# Patient Record
Sex: Female | Born: 1985 | Race: White | Hispanic: No | Smoking: Former smoker
Health system: Southern US, Community
[De-identification: ages and names within clinical notes are randomized; demographics above are authoritative.]

## PROBLEM LIST (undated history)

## (undated) DIAGNOSIS — T4145XA Adverse effect of unspecified anesthetic, initial encounter: Secondary | ICD-10-CM

## (undated) DIAGNOSIS — Z8742 Personal history of other diseases of the female genital tract: Secondary | ICD-10-CM

## (undated) DIAGNOSIS — J45909 Unspecified asthma, uncomplicated: Secondary | ICD-10-CM

## (undated) DIAGNOSIS — Z87442 Personal history of urinary calculi: Secondary | ICD-10-CM

## (undated) DIAGNOSIS — F32A Depression, unspecified: Secondary | ICD-10-CM

## (undated) DIAGNOSIS — F39 Unspecified mood [affective] disorder: Secondary | ICD-10-CM

## (undated) DIAGNOSIS — N809 Endometriosis, unspecified: Secondary | ICD-10-CM

## (undated) DIAGNOSIS — Z8709 Personal history of other diseases of the respiratory system: Secondary | ICD-10-CM

## (undated) DIAGNOSIS — R102 Pelvic and perineal pain: Secondary | ICD-10-CM

## (undated) DIAGNOSIS — F419 Anxiety disorder, unspecified: Secondary | ICD-10-CM

## (undated) DIAGNOSIS — Z8619 Personal history of other infectious and parasitic diseases: Secondary | ICD-10-CM

## (undated) DIAGNOSIS — K5792 Diverticulitis of intestine, part unspecified, without perforation or abscess without bleeding: Secondary | ICD-10-CM

## (undated) DIAGNOSIS — T8859XA Other complications of anesthesia, initial encounter: Secondary | ICD-10-CM

## (undated) DIAGNOSIS — F329 Major depressive disorder, single episode, unspecified: Secondary | ICD-10-CM

## (undated) HISTORY — PX: ABDOMINAL HYSTERECTOMY: SHX81

## (undated) HISTORY — PX: ABLATION COLPOCLESIS: SHX1118

## (undated) HISTORY — DX: Diverticulitis of intestine, part unspecified, without perforation or abscess without bleeding: K57.92

## (undated) HISTORY — PX: WISDOM TOOTH EXTRACTION: SHX21

---
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
## 2007-02-13 ENCOUNTER — Emergency Department (HOSPITAL_COMMUNITY): Admission: EM | Admit: 2007-02-13 | Discharge: 2007-02-14 | Payer: Self-pay | Admitting: Emergency Medicine

## 2007-03-31 ENCOUNTER — Emergency Department (HOSPITAL_COMMUNITY): Admission: EM | Admit: 2007-03-31 | Discharge: 2007-03-31 | Payer: Self-pay | Admitting: Emergency Medicine

## 2007-06-13 ENCOUNTER — Emergency Department (HOSPITAL_COMMUNITY): Admission: EM | Admit: 2007-06-13 | Discharge: 2007-06-13 | Payer: Self-pay | Admitting: Emergency Medicine

## 2007-06-20 ENCOUNTER — Emergency Department (HOSPITAL_COMMUNITY): Admission: EM | Admit: 2007-06-20 | Discharge: 2007-06-21 | Payer: Self-pay | Admitting: Emergency Medicine

## 2007-09-24 ENCOUNTER — Emergency Department (HOSPITAL_COMMUNITY): Admission: EM | Admit: 2007-09-24 | Discharge: 2007-09-24 | Payer: Self-pay | Admitting: Emergency Medicine

## 2007-10-11 ENCOUNTER — Emergency Department (HOSPITAL_COMMUNITY): Admission: EM | Admit: 2007-10-11 | Discharge: 2007-10-12 | Payer: Self-pay | Admitting: Emergency Medicine

## 2007-10-27 ENCOUNTER — Ambulatory Visit (HOSPITAL_COMMUNITY): Admission: RE | Admit: 2007-10-27 | Discharge: 2007-10-27 | Payer: Self-pay | Admitting: Obstetrics and Gynecology

## 2007-11-11 ENCOUNTER — Emergency Department (HOSPITAL_COMMUNITY): Admission: EM | Admit: 2007-11-11 | Discharge: 2007-11-11 | Payer: Self-pay | Admitting: Emergency Medicine

## 2007-12-24 ENCOUNTER — Inpatient Hospital Stay (HOSPITAL_COMMUNITY): Admission: EM | Admit: 2007-12-24 | Discharge: 2007-12-27 | Payer: Self-pay | Admitting: *Deleted

## 2007-12-24 ENCOUNTER — Ambulatory Visit: Payer: Self-pay | Admitting: *Deleted

## 2008-02-12 ENCOUNTER — Emergency Department (HOSPITAL_COMMUNITY): Admission: EM | Admit: 2008-02-12 | Discharge: 2008-02-12 | Payer: Self-pay | Admitting: Emergency Medicine

## 2009-02-12 ENCOUNTER — Emergency Department (HOSPITAL_COMMUNITY): Admission: EM | Admit: 2009-02-12 | Discharge: 2009-02-12 | Payer: Self-pay | Admitting: Family Medicine

## 2009-03-11 ENCOUNTER — Inpatient Hospital Stay (HOSPITAL_COMMUNITY): Admission: AD | Admit: 2009-03-11 | Discharge: 2009-03-11 | Payer: Self-pay | Admitting: Obstetrics and Gynecology

## 2009-03-17 ENCOUNTER — Emergency Department (HOSPITAL_COMMUNITY): Admission: EM | Admit: 2009-03-17 | Discharge: 2009-03-17 | Payer: Self-pay | Admitting: Emergency Medicine

## 2009-03-29 ENCOUNTER — Emergency Department (HOSPITAL_COMMUNITY): Admission: EM | Admit: 2009-03-29 | Discharge: 2009-03-29 | Payer: Self-pay | Admitting: Emergency Medicine

## 2009-04-13 HISTORY — PX: ESSURE TUBAL LIGATION: SUR464

## 2009-08-26 ENCOUNTER — Inpatient Hospital Stay (HOSPITAL_COMMUNITY): Admission: AD | Admit: 2009-08-26 | Discharge: 2009-08-26 | Payer: Self-pay | Admitting: Obstetrics and Gynecology

## 2009-11-08 ENCOUNTER — Inpatient Hospital Stay (HOSPITAL_COMMUNITY): Admission: RE | Admit: 2009-11-08 | Discharge: 2009-11-10 | Payer: Self-pay | Admitting: Obstetrics and Gynecology

## 2009-11-12 ENCOUNTER — Ambulatory Visit (HOSPITAL_COMMUNITY): Admission: RE | Admit: 2009-11-12 | Discharge: 2009-11-12 | Payer: Self-pay | Admitting: Anesthesiology

## 2010-04-30 ENCOUNTER — Ambulatory Visit (HOSPITAL_COMMUNITY): Admission: RE | Admit: 2010-04-30 | Payer: Self-pay | Source: Home / Self Care | Admitting: Obstetrics and Gynecology

## 2010-05-14 ENCOUNTER — Encounter: Payer: Self-pay | Admitting: Obstetrics and Gynecology

## 2010-06-28 LAB — CBC
HCT: 28.9 % — ABNORMAL LOW (ref 36.0–46.0)
HCT: 32.8 % — ABNORMAL LOW (ref 36.0–46.0)
Hemoglobin: 11 g/dL — ABNORMAL LOW (ref 12.0–15.0)
Hemoglobin: 9.5 g/dL — ABNORMAL LOW (ref 12.0–15.0)
MCH: 32 pg (ref 26.0–34.0)
MCH: 32.1 pg (ref 26.0–34.0)
MCHC: 33 g/dL (ref 30.0–36.0)
MCHC: 33.7 g/dL (ref 30.0–36.0)
MCV: 95.2 fL (ref 78.0–100.0)
MCV: 97.4 fL (ref 78.0–100.0)
Platelets: 239 10*3/uL (ref 150–400)
Platelets: 268 10*3/uL (ref 150–400)
RBC: 2.97 MIL/uL — ABNORMAL LOW (ref 3.87–5.11)
RBC: 3.44 MIL/uL — ABNORMAL LOW (ref 3.87–5.11)
RDW: 16 % — ABNORMAL HIGH (ref 11.5–15.5)
RDW: 16 % — ABNORMAL HIGH (ref 11.5–15.5)
WBC: 16.7 10*3/uL — ABNORMAL HIGH (ref 4.0–10.5)
WBC: 8.5 10*3/uL (ref 4.0–10.5)

## 2010-06-28 LAB — MRSA PCR SCREENING: MRSA by PCR: NEGATIVE

## 2010-06-28 LAB — RPR: RPR Ser Ql: NONREACTIVE

## 2010-06-30 LAB — URINALYSIS, ROUTINE W REFLEX MICROSCOPIC
Bilirubin Urine: NEGATIVE
Glucose, UA: NEGATIVE mg/dL
Hgb urine dipstick: NEGATIVE
Ketones, ur: NEGATIVE mg/dL
Nitrite: NEGATIVE
Protein, ur: NEGATIVE mg/dL
Specific Gravity, Urine: 1.02 (ref 1.005–1.030)
Urobilinogen, UA: 0.2 mg/dL (ref 0.0–1.0)
pH: 7.5 (ref 5.0–8.0)

## 2010-06-30 LAB — URINE MICROSCOPIC-ADD ON

## 2010-06-30 LAB — FETAL FIBRONECTIN: Fetal Fibronectin: NEGATIVE

## 2010-07-16 LAB — URINALYSIS, ROUTINE W REFLEX MICROSCOPIC
Bilirubin Urine: NEGATIVE
Glucose, UA: NEGATIVE mg/dL
Hgb urine dipstick: NEGATIVE
Ketones, ur: NEGATIVE mg/dL
Leukocytes, UA: NEGATIVE
Nitrite: POSITIVE — AB
Protein, ur: NEGATIVE mg/dL
Specific Gravity, Urine: 1.025 (ref 1.005–1.030)
Urobilinogen, UA: 0.2 mg/dL (ref 0.0–1.0)
pH: 6 (ref 5.0–8.0)

## 2010-07-16 LAB — GC/CHLAMYDIA PROBE AMP, GENITAL
Chlamydia, DNA Probe: NEGATIVE
GC Probe Amp, Genital: NEGATIVE

## 2010-07-16 LAB — WET PREP, GENITAL
Trich, Wet Prep: NONE SEEN
Yeast Wet Prep HPF POC: NONE SEEN

## 2010-07-16 LAB — URINE MICROSCOPIC-ADD ON

## 2010-07-16 LAB — POCT PREGNANCY, URINE
Preg Test, Ur: POSITIVE
Preg Test, Ur: NEGATIVE

## 2010-07-16 LAB — CBC
HCT: 42.7 % (ref 36.0–46.0)
Hemoglobin: 14.1 g/dL (ref 12.0–15.0)
MCHC: 33 g/dL (ref 30.0–36.0)
MCV: 99.6 fL (ref 78.0–100.0)
Platelets: 323 10*3/uL (ref 150–400)
RBC: 4.29 MIL/uL (ref 3.87–5.11)
RDW: 16.1 % — ABNORMAL HIGH (ref 11.5–15.5)
WBC: 14.6 10*3/uL — ABNORMAL HIGH (ref 4.0–10.5)

## 2010-07-16 LAB — HCG, QUANTITATIVE, PREGNANCY: hCG, Beta Chain, Quant, S: 15232 m[IU]/mL — ABNORMAL HIGH (ref <5)

## 2010-07-16 LAB — ABO/RH: ABO/RH(D): A POS

## 2010-07-16 LAB — POCT RAPID STREP A (OFFICE): Streptococcus, Group A Screen (Direct): NEGATIVE
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
## 2010-08-26 NOTE — Discharge Summary (Signed)
NAMEPARRIE, RASCO               ACCOUNT NO.:  [id number]   MEDICAL RECORD NO.:  [id number]          PATIENT TYPE:  INP   LOCATION:  9121                          [facility]ILITY:  [facility]   PHYSICIAN:  Malachi Pro. Ambrose Mantle, M.D. DATE OF BIRTH:  [birth year]   DATE OF ADMISSION:  12/02/2006  DATE OF DISCHARGE:  12/04/2006                               DISCHARGE SUMMARY   ADDENDUM:   DISCHARGE MEDICATIONS:  1. Vicodin 5/500, #20 tablets, one every 4-6 hours as needed for pain      with one refill.  2. Ativan 1 mg, #38, one p.o. b.i.d. p.r.n. anxiety.      Malachi Pro. Ambrose Mantle, M.D.  Electronically Signed     TFH/MEDQ  D:  12/04/2006  T:  12/05/2006  Job:  [id number]
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
## 2010-08-26 NOTE — Op Note (Signed)
NAMESHERECE, GAMBRILL               ACCOUNT NO.:  [id number]   MEDICAL RECORD NO.:  [id number]          PATIENT TYPE:  AMB   LOCATION:  SDC                           [facility]ILITY:  [facility]   PHYSICIAN:  Zenaida Niece, M.D.DATE OF BIRTH:  1985-07-12   DATE OF PROCEDURE:  10/27/2007  DATE OF DISCHARGE:                               OPERATIVE REPORT   PREOPERATIVE DIAGNOSIS:  Pelvic pain.   POSTOPERATIVE DIAGNOSIS:  Pelvic pain.   PROCEDURE:  Diagnostic laparoscopy.   SURGEON:  Zenaida Niece, MD.   ANESTHESIA:  General endotracheal tube.   FINDINGS:  She had a normal pelvis and normal abdomen.   SPECIMENS:  None.   ESTIMATED BLOOD LOSS:  Minimal.   COMPLICATIONS:  None.   PROCEDURE IN DETAIL:  The patient was taken to the operating room, and  placed in the dorsal supine position.  General anesthesia was induced.  Legs were placed in mobile stirrups, and left arm tucked to her side.  Abdomen was then prepped and draped in the usual sterile fashion,  bladder drained with a red Robinson catheter, and Hulka tenaculum  applied to the cervix for uterine manipulation.  Infraumbilical skin was  infiltrated with 0.75% Marcaine, and a 3/4-cm vertical incision was  made.   The Veress needle was inserted into the peritoneal cavity and placement  confirmed by the water drop test, and an opening pressure of 5 mmHg.  CO2 gas was insufflated to a pressure of 12 mmHg, and the Veress needle  was removed.  A 5-mm bladeless disposable trocar was then introduced  with direct visualization with the laparoscope.  A 5-mm port was then  also placed on the left side also under direct visualization.  The  pelvis and abdomen were well visualized.  Tubes, ovaries, and uterus  were all normal.  There was no evidence of endometriosis or adhesions.   The entire sigmoid colon appeared to be full of stool.  Appendix,  gallbladder, liver, and upper abdomen also appeared normal.  Again, no  source for her  pain was identified.  The 5-mm port on the left side was  removed with direct visualization.  All gas was allowed to deflate from  the abdomen, and the umbilical trocar was then removed.  Skin incisions were closed with interrupted subcuticular sutures of 4-0  Vicryl, followed by Dermabond.  The Hulka tenaculum was then removed.  The patient was awakened in the operating room.  She was taken to the  recovery room in stable condition after tolerating the procedure well.      Zenaida Niece, M.D.  Electronically Signed     TDM/MEDQ  D:  10/27/2007  T:  10/28/2007  Job:  [id number]

## 2010-08-26 NOTE — Discharge Summary (Signed)
NAMEELIZ, NIGG               ACCOUNT NO.:  [id number]   MEDICAL RECORD NO.:  [id number]          PATIENT TYPE:  INP   LOCATION:  9121                          [facility]ACILITY:  [facility]   PHYSICIAN:  Malachi Pro. Ambrose Mantle, M.D. DATE OF BIRTH:  01-03-1986   DATE OF ADMISSION:  12/02/2006  DATE OF DISCHARGE:  12/04/2006                               DISCHARGE SUMMARY   A 25 year old white female para 0-0-1-0, gravida 2, estimated  gestational age 66+ weeks by 7-week ultrasound with [facility] December 07, 2006,  presented for induction of labor because of a favorable cervix.  Her  blood group and type was A+ with a negative antibody, RPR was  nonreactive, rubella immune, hepatitis B surface antigen negative, HIV  negative, GC and chlamydia negative, one-hour Glucola 90, group B strep  negative.   The patient's prenatal course was complicated by back pain treated with  Darvocet, nausea and vomiting treated with Zofran, preterm contractions  treated with p.r.n. Procardia.  She had an abscess treated that was  MRSA.   OBSTETRICAL HISTORY:  She had an early abortion.   GYNECOLOGICAL HISTORY:  Abnormal Pap smear with normal colposcopy.   PAST MEDICAL HISTORY:  Asthma and anxiety.   SURGICAL HISTORY:  Ovarian cystectomy.   ALLERGIES:  BIAXIN, TORADOL and IBUPROFEN.   MEDICATIONS:  Darvocet p.r.n., Xanax p.r.n. and Ambien.   SOCIAL HISTORY:  History of marijuana use, quit tobacco with pregnancy.   PHYSICAL EXAMINATION:  VITAL SIGNS:  On admission she was afebrile,  vital signs were normal.  Fetal heart tones were reactive with  contractions every 3-5 minutes on Pitocin.  ABDOMEN:  Her abdomen was gravid, nontender.  Estimated fetal weight 7-  1/2 pounds.  PELVIC:  Cervix was 3-4 cm, 80% vertex at a -1. Dr. Jackelyn Knife performed  artificial rupture of membranes with clear fluid.  HEART:  Normal.  LUNGS:  Normal.   By 5:20 p.m. the patient was comfortable with her epidural.  Cervix was  9 cm.  Fetal  heart tones were reassuring with some mild variable  decelerations.  Contractions every 2-3 minutes.  The patient progressed  to complete dilatation, pushed well and delivered spontaneously a living  female infant 7 pounds 1 ounce with Apgars of 8 at 1 and 9 at 5 minutes.  Presentation was LOA.  Placenta was spontaneous and intact.  Cord blood  collection was done.  Second-degree laceration repaired with 3-0 Vicryl  with local block.  Several abrasions were hemostatic and not repaired.  Blood loss of less than 500 mL.   Postpartum, the patient did well and was discharged on the second  postpartum day.  A social work evaluation was done for history of  anxiety although the patient currently takes no medication to treat  symptoms.  Social work intervention was not necessary at this time  according to the Child psychotherapist.  On the second postpartum day, the  patient was afebrile.  Blood pressure normal.  She was ambulating well  without difficulty and she was ready for discharge.   LABORATORY DATA:  Initial hemoglobin of 11.9, hematocrit 34.2,  white  count 11,000, platelet count 256,000.  Follow-up hemoglobin 10.1, RPR  was nonreactive.   FINAL DIAGNOSES:  Intrauterine pregnancy 39+ weeks delivered low occiput  anterior.   OPERATION:  Spontaneous delivery vertex, repair of second-degree  laceration.   FINAL CONDITION:  Improved.   INSTRUCTIONS:  Include our regular discharge instruction booklet.  The  patient is advised to return to the office in 6 weeks for follow-up  examination.   DISCHARGE MEDICATIONS:  Vicodin 5/500 twenty tablets one every 4-6 hours  as needed for pain is given at discharge, one refill.      Malachi Pro. Ambrose Mantle, M.D.  Electronically Signed     TFH/MEDQ  D:  12/04/2006  T:  12/05/2006  Job:  [id number]

## 2010-08-29 NOTE — Op Note (Signed)
[name], [name]             ACCOUNT NO.:  [id number]   MEDICAL RECORD NO.:  [id number]          PATIENT TYPE:  AMB   LOCATION:  SDC                           [facility]ILITY:  [facility]   PHYSICIAN:  Naima A. Dillard, M.D. DATE OF BIRTH:  [birth year]   DATE OF PROCEDURE:  05/27/2005  DATE OF DISCHARGE:                                 OPERATIVE REPORT   PREOPERATIVE DIAGNOSIS:  Chronic pelvic pain.   POSTOPERATIVE DIAGNOSIS:  Chronic pelvic pain.   OPERATION/PROCEDURE:  Diagnostic laparoscopy with aspiration of right  ovarian simple cyst and biopsy of posterior cul-de-sac.   SURGEON:  Naima A. Normand Sloop, M.D.   ASSISTANTMarquis Lunch. Powell, P.A.-C.   ANESTHESIA:  General.   SPECIMENS:  Biopsy from posterior cul-de-sac.   ESTIMATED BLOOD LOSS:  Minimal.   URINARY OUTPUT:  125 mL.   COMPLICATIONS:  None.   CONDITION:  The patient went to recovery in stable condition.   DESCRIPTION OF PROCEDURE:  The patient was taken to the operating room where  she was given general anesthesia and placed in the dorsal lithotomy  position, prepped and draped in the normal sterile fashion.  A bivalve  speculum was placed into the vagina.  Anterior lip of the cervix grasped  with a single-tooth tenaculum and acorn manipulator was placed in the cervix  and attached to the tenaculum.   Attention was then turned to the abdomen where a 10 mm infraumbilical  incision was made after 0.25% Marcaine with epinephrine, 5 mL, was placed.  The incision was taken down to the fascia.  The fascia was incised and  extended bilaterally using Mayo scissors.  Peritoneum was identified and  tented up and entered.  A 0 Vicryl pursestring suture was placed around the  fascia.  The Hasson was placed into the abdomen cavity.  Intra-abdominal  placement was confirmed with the laparoscope after the abdomen was  insufflated with CO2 gas,  3 L.  The patient had normal-appearing anatomy,  normal appendix, normal liver,  normal-appearing gallbladder, normal-  appearing uterus.  Her right ovary was slightly enlarged.  To make sure  there was no dermoid cyst, I did aspirate it and it was just a functional  cyst with straw-clear fluid.  Right tube was normal.  Uterus was normal.  The anterior cul-de-sac was normal and in the posterior cul-de-sac, there  was a small red, kind of mass which was biopsied in total and removed.  Hemostasis was assured. The patient's left ovary and tube were normal.  There were some vessels seen in the posterior cul-de-sac that were slightly  engorged along her right ovary but no adhesions, no fibroids and no evidence  of endometriosis was seen.  A second trocar was placed 2 cm above the  symphysis pubis and a 5 mm trocar with direct visualization of the  laparoscope in order to manipulate the uterus and ovaries.  The 5 mm trocar  was then removed under direct visualization.  There was some bleeding right  along the peritoneum and fat where the trocar was placed.  This was made  hemostatic with a  Kleppinger.  All instruments were removed from the  abdomen. The subumbilical incision was closed with an 0 Vicryl.  All skin  incisions were closed with 3-0 Monocryl.  Sponge, lap and needle counts were  correct x2 were correct x2.  The patient went to the recovery room in stable  condition.      Naima A. Normand Sloop, M.D.  Electronically Signed     NAD/MEDQ  D:  05/27/2005  T:  05/27/2005  Job:  [id number]

## 2010-08-29 NOTE — Discharge Summary (Signed)
NAMEBOWEN, GOYAL NO.:  [id number]   MEDICAL RECORD NO.:  [id number]          PATIENT TYPE:  OBV   LOCATION:  6119                         FACILITY:  [facility]   PHYSICIAN:  Levander Campion, M.D.  DATE OF BIRTH:  1985-06-29   DATE OF ADMISSION:  10/14/2005  DATE OF DISCHARGE:  10/15/2005                                 DISCHARGE SUMMARY   DISCHARGE DIAGNOSES:  1.  Menorrhagia.  2.  Dysmenorrhea.  3.  Depression.  4.  Allergies.  5.  Asthma.   DISCHARGE MEDICATIONS:  1.  Seasonique two pills on July 5, two pills on July 6, and two pills on      July 7 and then resume one pill per day for full cycle.  2.  Vicodin 5/500 one to two tabs q4 to 6 hours p.r.n. pain.  3.  Ambien 5 mg one tablet at bedtime p.r.n. for sleep.  4.  Effexor 25 mg p.o. daily.  5.  Zyrtec daily.  6.  Advair daily.  7.  Adderall daily.  8.  Albuterol p.r.n. for asthma wheezing.   FOLLOW UP:  The patient is to followup with Dr. Henderson Cloud at [facility] on October 23, 2005 at 3:30 p.m.   PROCEDURE:  None.   CONSULTATION:  None.   HOSPITAL COURSE:  The patient is a 25 year old female with a history of  endometriosis and chronic pelvic pain who presented with a two week history  of persistent heavy vaginal bleeding and intermittent lower abdominal  cramping and sharp suprapubic pain which has worsened over the past three or  four days.  The patient reported having fever at home to 102 about two days  ago.  The patient also has burning with urination, nausea, vomiting.  The  patient was seen at [facility] ED one day ago and diagnosed with possible  cervicitis and flaring of her endometriosis and she was discharged home with  Phenergan and Dilaudid.  The patient also received one dose of Azithromycin  and one dose of Rocephin in the  [facility] ED.  The patient returned to  [facility] ED on October 15, 2005 due to lack of improvement.  1.  Abdominal pain.  The patient;s pain seemed to  be chronic in nature and      we suspect either a recurrent cyst or endometriosis.  Abdominal      ultrasound showed free fluid, but no other acute findings.  The patient      was afebrile, stable vital signs.  The patient's white count was within      normal limits at 6.5.  Her H and H was stable at 13.2/38.6.  A wet prep      was positive for clue cells, no Trichomonas or yeast.  A GC and      Chlamydia were negative and a urine pregnancy test was negative.      Electrolytes were within normal limits.  The patient and her mother      requested Vicodin for pain control.  We tried Toradol and Ultram  secondary to suspected drug seeking behavior by the mother and the      patient.  These did not work for the patient's pain and they kept      requesting narcotics.  The patient was discharged on Vicodin.  As this      problem seems to be a recurrent problem and will need long term care      plan, we consulted the patient's primary OB/GYN, Dr. Normand Sloop, but was      told that the patient was discharged from the practice due to      noncompliance with prior treatment of Lupron and Depo-Provera and also      for drug seeking behavior, repeatedly asking for narcotics.  We called      the gynecologist on call, Dr. Henderson Cloud, and she stated that the patient      seemed to be stable and this is a chronic problem that would need      outpatient followup, so it was decided to discharge the patient and we      set up a followup appointment  with Dr. Henderson Cloud at [facility]      on October 23, 2005 at 3:30.  Dr. Henderson Cloud suggested prescribing the patient      Solmon Ice two pills on July 5, two pills on July 6, and two pills on      July 7 and then continuing her normal dose for the rest of the cycle to      help with bleeding and pain.  2.  Menorrhagia.  The patient's H and H was stable throughout her      hospitalization and as above for her abdominal pain.  This is a chronic      problem that  needs outpatient treatment and so the patient was set up      with an outpatient appointment with gynecologist, Dr. Henderson Cloud.  3.  Dysuria.  The patient had a UA that was completely within normal limits,      but the patient still complained of dysuria, possibly referred pain from      her abdominal pain or urethral irritation from frequent trips to the      bathroom.  4.  Asthma was stable during this hospitalization on Zyrtec and Albuterol      p.r.n. and Advair.  5.  ADHD.  Adderall was held in the hospital.  Resumed on discharge.  6.  Depression.  The patient was on Effexor at home 25 mg and this was      continued in the hospital.  7.  Bacterial vaginosis diagnosed in [facility] ED and treated there and no      further treatment was required during this hospitalization.  8.  Social situation.  The patient's mother and aunt both recovering      narcotic addicts and the patient appears to be mirroring some of this      behavior.  Dr. Normand Sloop discharged from practice secondary to narcotic      seeking and noncompliance.  The patient was evaluated by inpatient      pediatric psychologist, Orlie Pollen. Lindie Spruce, Ph.D. who concurred with this      conclusion and recommends avoidance of narcotics if possible.           ______________________________  Levander Campion, M.D.     JH/MEDQ  D:  10/15/2005  T:  10/15/2005  Job:  [id number]

## 2010-08-29 NOTE — H&P (Signed)
[name], CORBIT             ACCOUNT NO.:  [id number]   MEDICAL RECORD NO.:  [id number]          PATIENT TYPE:  AMB   LOCATION:  SDC                           FACILITY:  [facility]   PHYSICIAN:  Naima A. Dillard, M.D. DATE OF BIRTH:  [birth year]   DATE OF ADMISSION:  DATE OF DISCHARGE:                                HISTORY & PHYSICAL   CHIEF COMPLAINT:  Chronic pelvic pain, dysmenorrhea.   HISTORY OF PRESENT ILLNESS:  The patient is a 25 year old female, gravida 1,  para 0-0-1-0, who has complained of having pelvic pain and heavy periods for  greater than 1 year.  The patient has tried Ponstel. She has tried Ortho-  Evra patch.  She has tried Designer, multimedia.  Now her pain is only relieved by  Vicodin around the clock.  The patient declines trying Lupron or Depo-  Provera.  The patient had an ultrasound which was normal.  Her gonorrhea and  Chlamydia were found to be negative.  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  The patient denies having any vaginal  discharge, odor, or fever.  She says her periods sometimes can be irregular.  Denies having any dysuria.  She does have some urgency and frequency.  No  hematuria.  Does have a history of kidney stones.  No constipation,  diarrhea, rectal bleeding, nausea, or vomiting.  No history of fibroids.  No  gallbladder disease.   PAST MEDICAL HISTORY:  As above.   PAST SURGICAL HISTORY:  As above.   PAST GYN HISTORY:  As above and history of abnormal Pap.  Had colposcopy  with negative biopsy.   FAMILY HISTORY:  Significant for breast cancer in a grandmother and  hypertension.   SOCIAL HISTORY:  Negative for tobacco, alcohol, and drug use.   MEDICATIONS:  1.  Adoral.  2.  Albuterol sulfate.  3.  Singulair.  4.  Advair.  5.  Birth control pills.   ALLERGIES:  BIAXIN causes hives.   PHYSICAL EXAMINATION:  VITAL SIGNS:  Blood pressure 120/80, weight 146  pounds.  HEENT:  Pupils are equal, hearing is  normal, and throat is clear. Thyroid is  not enlarged.  HEART:  Regular rate and rhythm.  CHEST:  Clear to auscultation bilaterally.  BREASTS:  No masses, discharge, skin changes, or nipple retraction.  BACK:  No CVA tenderness bilaterally.  ABDOMEN:  Nontender without masses or organomegaly. No rebound tenderness.  EXTREMITIES:  No cyanosis, clubbing, or edema.  NEUROLOGY:  Within normal limits.  PELVIC:  Vulva and vaginal examination are within normal limits.  Cervix has  no CMT.  Uterus is normal shape, size, consistency, and mild tenderness.  Adnexa are nontender bilaterally.   LABORATORY DATA:  UPT was negative.  Hemoglobin was 12.8.  Urinalysis was  found to be within normal limits.   ASSESSMENT:  Chronic pelvic pain.  All treatment reviewed with the patient.  The patient and mother desire laparoscopy.  They understand the risks are,  but not limited to bleeding, infection, damage to internal organs with  bowel, bladder,  and major blood vessels.  The patient refused to continue  with birth control pills.  She refuses to try Depo-Provera.  She did agree  to having Lupron if endometriosis is found after her surgery.      Naima A. Normand Sloop, M.D.  Electronically Signed     NAD/MEDQ  D:  05/26/2005  T:  05/27/2005  Job:  [id number]

## 2011-01-05 LAB — URINE MICROSCOPIC-ADD ON

## 2011-01-05 LAB — COMPREHENSIVE METABOLIC PANEL
ALT: 20
AST: 22
Albumin: 3.8
Alkaline Phosphatase: 56
BUN: 8
CO2: 29
Calcium: 9.5
Chloride: 105
Creatinine, Ser: 0.59
GFR calc Af Amer: >60
GFR calc non Af Amer: >60
Glucose, Bld: 85
Potassium: 4
Sodium: 139
Total Bilirubin: 0.7
Total Protein: 6.9

## 2011-01-05 LAB — POCT PREGNANCY, URINE
Operator id: 253041
Preg Test, Ur: NEGATIVE

## 2011-01-05 LAB — URINALYSIS, ROUTINE W REFLEX MICROSCOPIC
Bilirubin Urine: NEGATIVE
Bilirubin Urine: NEGATIVE
Glucose, UA: NEGATIVE
Glucose, UA: NEGATIVE
Hgb urine dipstick: NEGATIVE
Ketones, ur: NEGATIVE
Ketones, ur: NEGATIVE
Nitrite: NEGATIVE
Nitrite: NEGATIVE
Protein, ur: NEGATIVE
Protein, ur: NEGATIVE
Specific Gravity, Urine: 1.019
Specific Gravity, Urine: 1.022
Urobilinogen, UA: 0.2
Urobilinogen, UA: 0.2
pH: 5.5
pH: 6

## 2011-01-05 LAB — CBC
HCT: 38.8
Hemoglobin: 13.5
MCHC: 34.9
MCV: 94.2
Platelets: 320
RBC: 4.12
RDW: 15.5
WBC: 8.5

## 2011-01-05 LAB — DIFFERENTIAL
Basophils Absolute: 0
Basophils Relative: 0
Eosinophils Absolute: 0.1
Eosinophils Relative: 1
Lymphocytes Relative: 31
Lymphs Abs: 2.6
Monocytes Absolute: 0.5
Monocytes Relative: 6
Neutro Abs: 5.3
Neutrophils Relative %: 62

## 2011-01-05 LAB — LIPASE, BLOOD: Lipase: 18

## 2011-01-05 LAB — PREGNANCY, URINE: Preg Test, Ur: NEGATIVE

## 2011-01-08 LAB — RAPID STREP SCREEN (MED CTR MEBANE ONLY): Streptococcus, Group A Screen (Direct): NEGATIVE

## 2011-01-08 LAB — URINALYSIS, ROUTINE W REFLEX MICROSCOPIC
Bilirubin Urine: NEGATIVE
Glucose, UA: NEGATIVE
Hgb urine dipstick: NEGATIVE
Ketones, ur: NEGATIVE
Nitrite: NEGATIVE
Protein, ur: NEGATIVE
Specific Gravity, Urine: 1.019
Urobilinogen, UA: 0.2
pH: 7.5

## 2011-01-08 LAB — CBC
HCT: 42.3
Hemoglobin: 13.9
MCHC: 33
MCV: 97.1
Platelets: 227
RBC: 4.35
RDW: 14
WBC: 7.1

## 2011-01-08 LAB — DIFFERENTIAL
Basophils Absolute: 0
Basophils Relative: 0
Eosinophils Absolute: 0.1
Eosinophils Relative: 1
Lymphocytes Relative: 32
Lymphs Abs: 2.3
Monocytes Absolute: 0.5
Monocytes Relative: 7
Neutro Abs: 4.2
Neutrophils Relative %: 59

## 2011-01-08 LAB — PREGNANCY, URINE: Preg Test, Ur: NEGATIVE

## 2011-01-08 LAB — POCT PREGNANCY, URINE
Operator id: 24446
Preg Test, Ur: NEGATIVE

## 2011-01-08 LAB — GC/CHLAMYDIA PROBE AMP, GENITAL
Chlamydia, DNA Probe: NEGATIVE
GC Probe Amp, Genital: NEGATIVE

## 2011-01-09 LAB — DIFFERENTIAL
Basophils Absolute: 0
Basophils Relative: 0
Eosinophils Absolute: 0.1
Eosinophils Relative: 1
Lymphocytes Relative: 8 — ABNORMAL LOW
Lymphs Abs: 1
Monocytes Absolute: 0.5
Monocytes Relative: 4
Neutro Abs: 11 — ABNORMAL HIGH
Neutrophils Relative %: 87 — ABNORMAL HIGH

## 2011-01-09 LAB — PREGNANCY, URINE: Preg Test, Ur: NEGATIVE

## 2011-01-09 LAB — CBC
HCT: 36.6
HCT: 40.7
Hemoglobin: 12.7
Hemoglobin: 13.4
MCHC: 32.9
MCHC: 34.7
MCV: 97.1
MCV: 98.3
Platelets: 227
Platelets: 292
RBC: 3.77 — ABNORMAL LOW
RBC: 4.13
RDW: 14.4
RDW: 14.8
WBC: 12.6 — ABNORMAL HIGH
WBC: 13.2 — ABNORMAL HIGH

## 2011-01-09 LAB — COMPREHENSIVE METABOLIC PANEL
ALT: 17
AST: 18
Albumin: 3.3 — ABNORMAL LOW
Alkaline Phosphatase: 49
BUN: 5 — ABNORMAL LOW
CO2: 25
Calcium: 9
Chloride: 104
Creatinine, Ser: 0.63
GFR calc Af Amer: >60
GFR calc non Af Amer: >60
Glucose, Bld: 93
Potassium: 3.6
Sodium: 137
Total Bilirubin: 0.4
Total Protein: 6.4

## 2011-01-09 LAB — RAPID STREP SCREEN (MED CTR MEBANE ONLY): Streptococcus, Group A Screen (Direct): NEGATIVE

## 2011-01-09 LAB — MONONUCLEOSIS SCREEN: Mono Screen: NEGATIVE

## 2011-01-12 LAB — DRUGS OF ABUSE SCREEN W/O ALC, ROUTINE URINE
Amphetamine Screen, Ur: POSITIVE — AB
Barbiturate Quant, Ur: NEGATIVE
Benzodiazepines.: POSITIVE — AB
Cocaine Metabolites: NEGATIVE
Creatinine,U: 198.2
Marijuana Metabolite: NEGATIVE
Methadone: NEGATIVE
Opiate Screen, Urine: NEGATIVE
Phencyclidine (PCP): NEGATIVE
Propoxyphene: NEGATIVE

## 2011-01-12 LAB — AMPHETAMINES URINE CONFIRMATION
Amphetamines: 25000 ng/mL
Methamphetamine GC/MS, Ur: NEGATIVE
Methylenedioxyamphetamine: NEGATIVE
Methylenedioxyethylamphetamine: NEGATIVE
Methylenedioxymethamphetamine: NEGATIVE

## 2011-01-12 LAB — URINE MICROSCOPIC-ADD ON

## 2011-01-12 LAB — BENZODIAZEPINE, QUANTITATIVE, URINE
Alprazolam (GC/LC/MS), ur confirm: 450 ng/mL
Flurazepam GC/MS Conf: NEGATIVE
Nordiazepam GC/MS Conf: NEGATIVE
Oxazepam GC/MS Conf: NEGATIVE

## 2011-01-12 LAB — URINALYSIS, ROUTINE W REFLEX MICROSCOPIC
Bilirubin Urine: NEGATIVE
Glucose, UA: NEGATIVE
Hgb urine dipstick: NEGATIVE
Ketones, ur: NEGATIVE
Nitrite: POSITIVE — AB
Protein, ur: NEGATIVE
Specific Gravity, Urine: 1.021
Urobilinogen, UA: 0.2
pH: 6

## 2011-01-13 LAB — GC/CHLAMYDIA PROBE AMP, GENITAL
Chlamydia, DNA Probe: NEGATIVE
GC Probe Amp, Genital: NEGATIVE

## 2011-01-13 LAB — WET PREP, GENITAL
Trich, Wet Prep: NONE SEEN
Yeast Wet Prep HPF POC: NONE SEEN

## 2011-01-14 LAB — COMPREHENSIVE METABOLIC PANEL
ALT: 18
AST: 23
Albumin: 3.9
Alkaline Phosphatase: 58
BUN: 5 — ABNORMAL LOW
CO2: 29
Calcium: 9.7
Chloride: 102
Creatinine, Ser: 0.61
GFR calc Af Amer: >60
GFR calc non Af Amer: >60
Glucose, Bld: 103 — ABNORMAL HIGH
Potassium: 3.4 — ABNORMAL LOW
Sodium: 138
Total Bilirubin: 0.7
Total Protein: 7.2

## 2011-01-14 LAB — CBC
HCT: 42.1
Hemoglobin: 14
MCHC: 33.3
MCV: 99.1
Platelets: 295
RBC: 4.25
RDW: 15.1
WBC: 7.6

## 2011-01-14 LAB — TSH: TSH: 1.619

## 2011-01-16 LAB — BASIC METABOLIC PANEL
BUN: 8
CO2: 27
Calcium: 9.6
Chloride: 106
Creatinine, Ser: 0.63
GFR calc Af Amer: >60
GFR calc non Af Amer: >60
Glucose, Bld: 75
Potassium: 4.7
Sodium: 139

## 2011-01-16 LAB — URINE MICROSCOPIC-ADD ON

## 2011-01-16 LAB — DIFFERENTIAL
Basophils Absolute: 0
Basophils Relative: 0
Eosinophils Absolute: 0.1 — ABNORMAL LOW
Eosinophils Relative: 2
Lymphocytes Relative: 60 — ABNORMAL HIGH
Lymphs Abs: 3.1
Monocytes Absolute: 0.4
Monocytes Relative: 8
Neutro Abs: 1.6 — ABNORMAL LOW
Neutrophils Relative %: 30 — ABNORMAL LOW

## 2011-01-16 LAB — CBC
HCT: 39.9
Hemoglobin: 13.8
MCHC: 34.7
MCV: 92.5
Platelets: 306
RBC: 4.31
RDW: 14.7
WBC: 5.3

## 2011-01-16 LAB — URINALYSIS, ROUTINE W REFLEX MICROSCOPIC
Bilirubin Urine: NEGATIVE
Glucose, UA: NEGATIVE
Ketones, ur: NEGATIVE
Leukocytes, UA: NEGATIVE
Nitrite: NEGATIVE
Protein, ur: NEGATIVE
Specific Gravity, Urine: 1.027
Urobilinogen, UA: 0.2
pH: 5.5

## 2011-01-16 LAB — PREGNANCY, URINE: Preg Test, Ur: NEGATIVE

## 2011-01-22 LAB — URINE MICROSCOPIC-ADD ON

## 2011-01-22 LAB — CBC
HCT: 35.8 — ABNORMAL LOW
Hemoglobin: 12.3
MCHC: 34.3
MCV: 98.1
Platelets: 363
RBC: 3.65 — ABNORMAL LOW
RDW: 14.8 — ABNORMAL HIGH
WBC: 10.7 — ABNORMAL HIGH

## 2011-01-22 LAB — URINE CULTURE: Colony Count: >=100000

## 2011-01-22 LAB — URINALYSIS, ROUTINE W REFLEX MICROSCOPIC
Bilirubin Urine: NEGATIVE
Glucose, UA: NEGATIVE
Ketones, ur: NEGATIVE
Nitrite: POSITIVE — AB
Protein, ur: 100 — AB
Specific Gravity, Urine: 1.025
Urobilinogen, UA: 0.2
pH: 6

## 2011-01-23 LAB — CBC
HCT: 29.3 — ABNORMAL LOW
HCT: 34.2 — ABNORMAL LOW
Hemoglobin: 10.1 — ABNORMAL LOW
Hemoglobin: 11.9 — ABNORMAL LOW
MCHC: 34.5
MCHC: 34.6
MCV: 100.8 — ABNORMAL HIGH
MCV: 101.4 — ABNORMAL HIGH
Platelets: 202
Platelets: 256
RBC: 2.89 — ABNORMAL LOW
RBC: 3.4 — ABNORMAL LOW
RDW: 15.5 — ABNORMAL HIGH
RDW: 16.4 — ABNORMAL HIGH
WBC: 11 — ABNORMAL HIGH
WBC: 15.1 — ABNORMAL HIGH

## 2011-01-23 LAB — DIFFERENTIAL
Basophils Absolute: 0
Basophils Relative: 0
Eosinophils Absolute: 0.1
Eosinophils Relative: 1
Lymphocytes Relative: 17
Lymphs Abs: 1.9
Monocytes Absolute: 0.7
Monocytes Relative: 7
Neutro Abs: 8.3 — ABNORMAL HIGH
Neutrophils Relative %: 75

## 2011-01-23 LAB — RPR: RPR Ser Ql: NONREACTIVE

## 2011-01-23 LAB — CCBB MATERNAL DONOR DRAW

## 2011-01-26 LAB — CULTURE, ROUTINE-ABSCESS: Gram Stain: NONE SEEN

## 2011-01-27 LAB — URINALYSIS, ROUTINE W REFLEX MICROSCOPIC
Bilirubin Urine: NEGATIVE
Bilirubin Urine: NEGATIVE
Glucose, UA: NEGATIVE
Glucose, UA: NEGATIVE
Hgb urine dipstick: NEGATIVE
Hgb urine dipstick: NEGATIVE
Ketones, ur: NEGATIVE
Ketones, ur: NEGATIVE
Nitrite: NEGATIVE
Nitrite: NEGATIVE
Protein, ur: NEGATIVE
Protein, ur: NEGATIVE
Specific Gravity, Urine: 1.015
Specific Gravity, Urine: 1.02
Urobilinogen, UA: 0.2
Urobilinogen, UA: 0.2
pH: 6
pH: 6.5

## 2011-01-27 LAB — WET PREP, GENITAL
Clue Cells Wet Prep HPF POC: NONE SEEN
Trich, Wet Prep: NONE SEEN
Yeast Wet Prep HPF POC: NONE SEEN

## 2011-01-27 LAB — STREP B DNA PROBE: Strep Group B Ag: NEGATIVE

## 2011-01-27 LAB — URINE MICROSCOPIC-ADD ON

## 2011-01-27 LAB — GC/CHLAMYDIA PROBE AMP, GENITAL
Chlamydia, DNA Probe: NEGATIVE
GC Probe Amp, Genital: NEGATIVE

## 2011-01-27 LAB — FETAL FIBRONECTIN: Fetal Fibronectin: NEGATIVE

## 2011-01-28 LAB — URINALYSIS, ROUTINE W REFLEX MICROSCOPIC
Bilirubin Urine: NEGATIVE
Glucose, UA: NEGATIVE
Hgb urine dipstick: NEGATIVE
Ketones, ur: NEGATIVE
Nitrite: NEGATIVE
Protein, ur: NEGATIVE
Specific Gravity, Urine: 1.025
Urobilinogen, UA: 0.2
pH: 6

## 2011-01-28 LAB — FETAL FIBRONECTIN: Fetal Fibronectin: NEGATIVE

## 2011-02-08 ENCOUNTER — Emergency Department (HOSPITAL_COMMUNITY): Payer: Medicaid Other

## 2011-02-08 ENCOUNTER — Emergency Department (HOSPITAL_COMMUNITY)
Admission: EM | Admit: 2011-02-08 | Discharge: 2011-02-08 | Disposition: A | Discharge status: Home / Self Care | Payer: Medicaid Other | Attending: Emergency Medicine | Admitting: Emergency Medicine

## 2011-02-08 DIAGNOSIS — IMO0002 Reserved for concepts with insufficient information to code with codable children: Secondary | ICD-10-CM | POA: Insufficient documentation

## 2011-02-08 DIAGNOSIS — S92919A Unspecified fracture of unspecified toe(s), initial encounter for closed fracture: Secondary | ICD-10-CM | POA: Insufficient documentation

## 2011-02-08 DIAGNOSIS — S92911A Unspecified fracture of right toe(s), initial encounter for closed fracture: Secondary | ICD-10-CM

## 2011-02-08 MED ORDER — HYDROCODONE-ACETAMINOPHEN 5-325 MG PO TABS: 1.0000 | ORAL_TABLET | ORAL | Status: AC | PRN

## 2011-02-08 MED ORDER — IBUPROFEN 800 MG PO TABS
800.0000 mg | ORAL_TABLET | Freq: Once | ORAL | Status: AC
  Administered 2011-02-08: 800 mg via ORAL
  Filled 2011-02-08: qty 1

## 2011-02-08 MED ORDER — HYDROCODONE-ACETAMINOPHEN 5-325 MG PO TABS
1.0000 | ORAL_TABLET | Freq: Once | ORAL | Status: AC
  Administered 2011-02-08: 1 via ORAL
  Filled 2011-02-08: qty 1

## 2011-02-08 MED ORDER — IBUPROFEN 800 MG PO TABS: 800.0000 mg | ORAL_TABLET | Freq: Once | ORAL | Status: AC

## 2011-02-08 NOTE — ED Notes (Signed)
Pt presents with right foot pain. Pt hit foot on door yesterday.

## 2011-02-08 NOTE — ED Provider Notes (Signed)
History     CSN: [id number] Arrival date & time: 02/08/2011  6:30 PM   First MD Initiated Contact with Patient 02/08/11 1826      Chief Complaint  Patient presents with  . Foot Injury    (Consider location/radiation/quality/duration/timing/severity/associated sxs/prior treatment) Patient is a 25 y.o. female presenting with foot injury. The history is provided by the patient.  Foot Injury  The incident occurred yesterday. The incident occurred at home. The injury mechanism was a direct blow (Stubbed her right pinky toe on a door jamb yesterday). The pain is present in the right toes. The quality of the pain is described as throbbing. The pain is at a severity of 6/10. The pain is moderate. The pain has been constant since onset. Associated symptoms include loss of motion and tingling. Pertinent negatives include no numbness and no loss of sensation. The symptoms are aggravated by palpation and activity. She has tried NSAIDs and heat for the symptoms. The treatment provided no relief.    History reviewed. No pertinent past medical history.  Past Surgical History  Procedure Date  . Ablation colpoclesis   . Tubal ligation     History reviewed. No pertinent family history.  History  Substance Use Topics  . Smoking status: Never Smoker   . Smokeless tobacco: Not on file  . Alcohol Use: No    OB History    Grav Para Term Preterm Abortions TAB SAB Ect Mult Living   2 2 2       2       Review of Systems  Constitutional: Negative for fever.  HENT: Negative for sore throat and neck pain.   Eyes: Negative.   Respiratory: Negative for chest tightness and shortness of breath.   Cardiovascular: Negative for chest pain.  Gastrointestinal: Negative for nausea and abdominal pain.  Genitourinary: Negative.   Musculoskeletal: Positive for joint swelling and arthralgias.  Skin: Positive for color change. Negative for rash and wound.  Neurological: Positive for tingling. Negative for  weakness and numbness.  Hematological: Negative.   Psychiatric/Behavioral: Negative.     Allergies  Review of patient's allergies indicates no known allergies.  Home Medications  No current outpatient prescriptions on file.  BP 135/69  Pulse 70  Temp(Src) 98.5 F (36.9 C) (Oral)  Resp 20  SpO2 100%  LMP 02/07/2011  Physical Exam  Nursing note and vitals reviewed. Constitutional: She is oriented to person, place, and time. She appears well-developed and well-nourished.  HENT:  Head: Normocephalic.  Eyes: Conjunctivae are normal.  Neck: Normal range of motion.  Cardiovascular: Normal rate and intact distal pulses.  Exam reveals no decreased pulses.   Pulses:      Dorsalis pedis pulses are 2+ on the right side, and 2+ on the left side.       Posterior tibial pulses are 2+ on the right side, and 2+ on the left side.  Pulmonary/Chest: Effort normal.  Musculoskeletal: She exhibits edema and tenderness.       Right foot: She exhibits decreased range of motion, tenderness and swelling. She exhibits no deformity.       Moderate edema and ecchymosis of right 5th toe radiating to mid dorsal foot.  Distal sensation of 5th toe slightly reduced.  Cap refill less than 3 sec.  Neurological: She is alert and oriented to person, place, and time. No sensory deficit.  Skin: Skin is warm, dry and intact.    ED Course  Procedures (including critical care time)  Labs Reviewed -  No data to display Dg Foot Complete Right  02/08/2011  *RADIOLOGY REPORT*  Clinical Data: The right foot pain.  RIGHT FOOT COMPLETE - 3+ VIEW  Comparison: None.  Findings: Transverse fracture of the base of the proximal phalanx of the right small toe.  Minimal apex medial angulation.  Fracture is nondisplaced.  Otherwise the foot appears normal.  IMPRESSION: Transverse nondisplaced fracture of the proximal phalanx of the right small toe.  Minimal apex medial angulation.  Original Report Authenticated By: Andreas Newport,  M.D.     No diagnosis found.    MDM  Buddy tape,  Post op shoe.  Ibuprofen,  Hydrocodone.  Referral Dr Romeo Apple.        Candis Musa, PA 02/08/11 1920

## 2011-02-09 NOTE — ED Provider Notes (Signed)
Medical screening examination/treatment/procedure(s) were performed by non-physician practitioner and as supervising physician I was immediately available for consultation/collaboration.   Abreanna Drawdy, MD 02/09/11 0025 

## 2013-06-22 ENCOUNTER — Emergency Department (HOSPITAL_COMMUNITY)
Admission: EM | Admit: 2013-06-22 | Discharge: 2013-06-22 | Disposition: A | Discharge status: Home / Self Care | Payer: Medicaid Other | Attending: Emergency Medicine | Admitting: Emergency Medicine

## 2013-06-22 ENCOUNTER — Emergency Department (HOSPITAL_COMMUNITY): Payer: Medicaid Other

## 2013-06-22 ENCOUNTER — Encounter (HOSPITAL_COMMUNITY): Payer: Self-pay | Admitting: Emergency Medicine

## 2013-06-22 DIAGNOSIS — Y9389 Activity, other specified: Secondary | ICD-10-CM | POA: Insufficient documentation

## 2013-06-22 DIAGNOSIS — S20219A Contusion of unspecified front wall of thorax, initial encounter: Secondary | ICD-10-CM | POA: Insufficient documentation

## 2013-06-22 DIAGNOSIS — Y92410 Unspecified street and highway as the place of occurrence of the external cause: Secondary | ICD-10-CM | POA: Insufficient documentation

## 2013-06-22 DIAGNOSIS — IMO0002 Reserved for concepts with insufficient information to code with codable children: Secondary | ICD-10-CM | POA: Insufficient documentation

## 2013-06-22 DIAGNOSIS — S7002XA Contusion of left hip, initial encounter: Secondary | ICD-10-CM

## 2013-06-22 DIAGNOSIS — Z880 Allergy status to penicillin: Secondary | ICD-10-CM | POA: Insufficient documentation

## 2013-06-22 DIAGNOSIS — Z8742 Personal history of other diseases of the female genital tract: Secondary | ICD-10-CM | POA: Insufficient documentation

## 2013-06-22 DIAGNOSIS — S139XXA Sprain of joints and ligaments of unspecified parts of neck, initial encounter: Secondary | ICD-10-CM | POA: Insufficient documentation

## 2013-06-22 DIAGNOSIS — S7000XA Contusion of unspecified hip, initial encounter: Secondary | ICD-10-CM | POA: Insufficient documentation

## 2013-06-22 DIAGNOSIS — S161XXA Strain of muscle, fascia and tendon at neck level, initial encounter: Secondary | ICD-10-CM

## 2013-06-22 HISTORY — DX: Endometriosis, unspecified: N80.9

## 2013-06-22 MED ORDER — MORPHINE SULFATE 4 MG/ML IJ SOLN
4.0000 mg | Freq: Once | INTRAMUSCULAR | Status: AC
  Administered 2013-06-22: 4 mg via INTRAVENOUS
  Filled 2013-06-22: qty 1

## 2013-06-22 MED ORDER — CYCLOBENZAPRINE HCL 10 MG PO TABS: 10.0000 mg | ORAL_TABLET | Freq: Every day | ORAL | Status: DC

## 2013-06-22 MED ORDER — KETOROLAC TROMETHAMINE 30 MG/ML IJ SOLN
30.0000 mg | Freq: Once | INTRAMUSCULAR | Status: AC
  Administered 2013-06-22: 30 mg via INTRAVENOUS
  Filled 2013-06-22: qty 1

## 2013-06-22 MED ORDER — FENTANYL CITRATE 0.05 MG/ML IJ SOLN
50.0000 ug | Freq: Once | INTRAMUSCULAR | Status: AC
  Administered 2013-06-22: 50 ug via INTRAVENOUS
  Filled 2013-06-22: qty 2

## 2013-06-22 MED ORDER — HYDROCODONE-ACETAMINOPHEN 5-325 MG PO TABS: 1.0000 | ORAL_TABLET | ORAL | Status: DC | PRN

## 2013-06-22 MED ORDER — IBUPROFEN 600 MG PO TABS: 600.0000 mg | ORAL_TABLET | Freq: Four times a day (QID) | ORAL | Status: DC | PRN

## 2013-06-22 NOTE — ED Notes (Signed)
Notified Parker, PA of patient request for more pain medication.

## 2013-06-22 NOTE — ED Provider Notes (Signed)
Medical screening examination/treatment/procedure(s) were performed by non-physician practitioner and as supervising physician I was immediately available for consultation/collaboration.     Veryl Speak, MD 06/22/13 [phone]
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
## 2013-06-22 NOTE — ED Notes (Signed)
Patient returned from X-ray 

## 2013-06-22 NOTE — Discharge Instructions (Signed)
Call for a follow up appointment with a Family or Primary Care Provider.  Ice your left hip, neck and chest wall 3-4 times a day. Return if Symptoms worsen.   Take medication as prescribed.
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
## 2013-06-22 NOTE — ED Notes (Signed)
Patient transported to X-ray 

## 2013-08-03 ENCOUNTER — Encounter (HOSPITAL_COMMUNITY): Payer: Self-pay | Admitting: *Deleted

## 2013-08-07 ENCOUNTER — Encounter (HOSPITAL_COMMUNITY): Payer: Self-pay | Admitting: Pharmacist

## 2013-08-17 NOTE — H&P (Signed)
[name] is an 28 y.o. female. She has been having right pelvic pain for the past year or so, gradually getting worse.  She had an annual exam in January, pain was mild-moderate then, started using Stadol NS prn.  Pelvic ultrasound in February was normal.  Since January pain has gradually been getting worse, requiring increasing use of Stadol NS, now requiring Vicodin.  She has had 2 previous laparoscopies, had endometriosis on the first one but her last one 4-5 years ago was normal.  She also had Essure in 2011.    Pertinent Gynecological History: Last pap: normal Date: January 2015 OB History: G3, P2012   Menstrual History:  No LMP recorded.    Past Medical History  Diagnosis Date  . Endometriosis   . Ovarian cyst   Anxiety, depression, mild asthma  Past Surgical History  Procedure Laterality Date  . Ablation colpoclesis    . Tubal ligation    Essure Laparoscopy x 2 Right ovarian cystectomy Axilarry cystectomy  No family history on file.  Social History:  reports that she has never smoked. She does not have any smokeless tobacco history on file. She reports that she does not drink alcohol or use illicit drugs.  Allergies:  Allergies  Allergen Reactions  . Biaxin [Clarithromycin] Rash  . Penicillins Rash  . Toradol [Ketorolac Tromethamine] Rash    Tolerates ibuprofen    No prescriptions prior to admission    Review of Systems  Respiratory: Negative.   Cardiovascular: Negative.   Gastrointestinal: Negative.   Genitourinary: Negative.     There were no vitals taken for this visit. Physical Exam  Constitutional: She appears well-developed and well-nourished.  Neck: Neck supple. No thyromegaly present.  Cardiovascular: Normal rate, regular rhythm and normal heart sounds.   No murmur heard. Respiratory: Effort normal and breath sounds normal. No respiratory distress. She has no wheezes.  GI: Soft. She exhibits no distension and no mass. There is tenderness  (mild RLQ).  Genitourinary: Vagina normal and uterus normal.  No adnexal mass, tender on right    No results found for this or any previous visit (from the past 24 hour(s)).  No results found.  Assessment/Plan: Recurrent, worsening right pelvic pain with h/o endometriosis.  Medical and surgical options have been discussed, she wants to proceed with laparoscopy.  The procedure and risks have been discussed, as well as the fact that I may not find a source for her pain.  Will proceed with laparoscopy with treatment of any abnormalities found.    Kharma Sampsel 08/17/2013, 9:15 PM

## 2013-08-18 ENCOUNTER — Encounter (HOSPITAL_COMMUNITY): Payer: Self-pay | Admitting: *Deleted

## 2013-08-18 ENCOUNTER — Ambulatory Visit (HOSPITAL_COMMUNITY): Payer: Medicaid Other | Admitting: Anesthesiology

## 2013-08-18 ENCOUNTER — Encounter (HOSPITAL_COMMUNITY): Payer: Medicaid Other | Admitting: Anesthesiology

## 2013-08-18 ENCOUNTER — Encounter (HOSPITAL_COMMUNITY)
Admission: RE | Disposition: A | Discharge status: Home / Self Care | Payer: Self-pay | Source: Ambulatory Visit | Attending: Obstetrics and Gynecology

## 2013-08-18 ENCOUNTER — Ambulatory Visit (HOSPITAL_COMMUNITY)
Admission: RE | Admit: 2013-08-18 | Discharge: 2013-08-18 | Disposition: A | Discharge status: Home / Self Care | Payer: Medicaid Other | Source: Ambulatory Visit | Attending: Obstetrics and Gynecology | Admitting: Obstetrics and Gynecology

## 2013-08-18 DIAGNOSIS — N803 Endometriosis of pelvic peritoneum, unspecified: Secondary | ICD-10-CM | POA: Insufficient documentation

## 2013-08-18 DIAGNOSIS — F341 Dysthymic disorder: Secondary | ICD-10-CM | POA: Insufficient documentation

## 2013-08-18 DIAGNOSIS — R102 Pelvic and perineal pain: Secondary | ICD-10-CM | POA: Diagnosis present

## 2013-08-18 DIAGNOSIS — J45909 Unspecified asthma, uncomplicated: Secondary | ICD-10-CM | POA: Insufficient documentation

## 2013-08-18 DIAGNOSIS — N949 Unspecified condition associated with female genital organs and menstrual cycle: Secondary | ICD-10-CM | POA: Insufficient documentation

## 2013-08-18 DIAGNOSIS — G8929 Other chronic pain: Secondary | ICD-10-CM | POA: Insufficient documentation

## 2013-08-18 HISTORY — DX: Unspecified asthma, uncomplicated: J45.909

## 2013-08-18 HISTORY — PX: LAPAROSCOPY: SHX197

## 2013-08-18 LAB — CBC
HCT: 40.4 % (ref 36.0–46.0)
Hemoglobin: 13.7 g/dL (ref 12.0–15.0)
MCH: 34 pg (ref 26.0–34.0)
MCHC: 33.9 g/dL (ref 30.0–36.0)
MCV: 100.2 fL — ABNORMAL HIGH (ref 78.0–100.0)
Platelets: 288 10*3/uL (ref 150–400)
RBC: 4.03 MIL/uL (ref 3.87–5.11)
RDW: 13.9 % (ref 11.5–15.5)
WBC: 8.9 10*3/uL (ref 4.0–10.5)

## 2013-08-18 LAB — PREGNANCY, URINE: Preg Test, Ur: NEGATIVE

## 2013-08-18 SURGERY — LAPAROSCOPY, DIAGNOSTIC
Anesthesia: General | Site: Abdomen

## 2013-08-18 MED ORDER — METOCLOPRAMIDE HCL 5 MG/ML IJ SOLN: 10.0000 mg | Freq: Once | INTRAMUSCULAR | Status: DC | PRN

## 2013-08-18 MED ORDER — BUPIVACAINE HCL (PF) 0.25 % IJ SOLN
INTRAMUSCULAR | Status: AC
  Filled 2013-08-18: qty 30

## 2013-08-18 MED ORDER — OXYCODONE-ACETAMINOPHEN 5-325 MG PO TABS: 1.0000 | ORAL_TABLET | ORAL | Status: DC | PRN

## 2013-08-18 MED ORDER — MIDAZOLAM HCL 2 MG/2ML IJ SOLN
INTRAMUSCULAR | Status: DC | PRN
  Administered 2013-08-18: 1 mg via INTRAVENOUS

## 2013-08-18 MED ORDER — DEXAMETHASONE SODIUM PHOSPHATE 10 MG/ML IJ SOLN
INTRAMUSCULAR | Status: DC | PRN
  Administered 2013-08-18: 10 mg via INTRAVENOUS

## 2013-08-18 MED ORDER — LACTATED RINGERS IV SOLN: INTRAVENOUS | Status: DC

## 2013-08-18 MED ORDER — SCOPOLAMINE 1 MG/3DAYS TD PT72
MEDICATED_PATCH | TRANSDERMAL | Status: AC
  Filled 2013-08-18: qty 1

## 2013-08-18 MED ORDER — NEOSTIGMINE METHYLSULFATE 10 MG/10ML IV SOLN
INTRAVENOUS | Status: DC | PRN
  Administered 2013-08-18: 3 mg via INTRAVENOUS

## 2013-08-18 MED ORDER — 0.9 % SODIUM CHLORIDE (POUR BTL) OPTIME
TOPICAL | Status: DC | PRN
  Administered 2013-08-18: 1000 mL

## 2013-08-18 MED ORDER — LIDOCAINE HCL (CARDIAC) 20 MG/ML IV SOLN
INTRAVENOUS | Status: DC | PRN
  Administered 2013-08-18: 30 mg via INTRAVENOUS
  Administered 2013-08-18: 70 mg via INTRAVENOUS

## 2013-08-18 MED ORDER — PROPOFOL 10 MG/ML IV EMUL
INTRAVENOUS | Status: AC
  Filled 2013-08-18: qty 20

## 2013-08-18 MED ORDER — ONDANSETRON HCL 4 MG/2ML IJ SOLN
INTRAMUSCULAR | Status: DC | PRN
  Administered 2013-08-18: 4 mg via INTRAVENOUS

## 2013-08-18 MED ORDER — DEXAMETHASONE SODIUM PHOSPHATE 10 MG/ML IJ SOLN
INTRAMUSCULAR | Status: AC
  Filled 2013-08-18: qty 1

## 2013-08-18 MED ORDER — ONDANSETRON HCL 4 MG/2ML IJ SOLN
INTRAMUSCULAR | Status: AC
  Filled 2013-08-18: qty 2

## 2013-08-18 MED ORDER — FENTANYL CITRATE 0.05 MG/ML IJ SOLN
INTRAMUSCULAR | Status: DC | PRN
  Administered 2013-08-18 (×5): 50 ug via INTRAVENOUS

## 2013-08-18 MED ORDER — ROCURONIUM BROMIDE 100 MG/10ML IV SOLN
INTRAVENOUS | Status: AC
  Filled 2013-08-18: qty 1

## 2013-08-18 MED ORDER — PROPOFOL 10 MG/ML IV BOLUS
INTRAVENOUS | Status: DC | PRN
  Administered 2013-08-18: 180 mg via INTRAVENOUS

## 2013-08-18 MED ORDER — MIDAZOLAM HCL 2 MG/2ML IJ SOLN
INTRAMUSCULAR | Status: AC
  Filled 2013-08-18: qty 2

## 2013-08-18 MED ORDER — NEOSTIGMINE METHYLSULFATE 10 MG/10ML IV SOLN
INTRAVENOUS | Status: AC
  Filled 2013-08-18: qty 1

## 2013-08-18 MED ORDER — BUPIVACAINE HCL (PF) 0.25 % IJ SOLN
INTRAMUSCULAR | Status: DC | PRN
  Administered 2013-08-18: 8 mL

## 2013-08-18 MED ORDER — LACTATED RINGERS IV SOLN
INTRAVENOUS | Status: DC
  Administered 2013-08-18 (×2): via INTRAVENOUS

## 2013-08-18 MED ORDER — GLYCOPYRROLATE 0.2 MG/ML IJ SOLN
INTRAMUSCULAR | Status: AC
  Filled 2013-08-18: qty 3

## 2013-08-18 MED ORDER — GLYCOPYRROLATE 0.2 MG/ML IJ SOLN
INTRAMUSCULAR | Status: DC | PRN
  Administered 2013-08-18: 0.6 mg via INTRAVENOUS

## 2013-08-18 MED ORDER — IBUPROFEN 200 MG PO TABS: 200.0000 mg | ORAL_TABLET | Freq: Four times a day (QID) | ORAL | Status: DC | PRN

## 2013-08-18 MED ORDER — IBUPROFEN 100 MG/5ML PO SUSP
200.0000 mg | Freq: Four times a day (QID) | ORAL | Status: DC | PRN
  Filled 2013-08-18: qty 20

## 2013-08-18 MED ORDER — CEFAZOLIN SODIUM-DEXTROSE 2-3 GM-% IV SOLR
INTRAVENOUS | Status: AC
  Filled 2013-08-18: qty 50

## 2013-08-18 MED ORDER — FENTANYL CITRATE 0.05 MG/ML IJ SOLN
25.0000 ug | INTRAMUSCULAR | Status: DC | PRN
  Administered 2013-08-18 (×2): 50 ug via INTRAVENOUS

## 2013-08-18 MED ORDER — HEPARIN SODIUM (PORCINE) 5000 UNIT/ML IJ SOLN
INTRAMUSCULAR | Status: AC
  Filled 2013-08-18: qty 1

## 2013-08-18 MED ORDER — FENTANYL CITRATE 0.05 MG/ML IJ SOLN
INTRAMUSCULAR | Status: AC
  Filled 2013-08-18: qty 5

## 2013-08-18 MED ORDER — MEPERIDINE HCL 25 MG/ML IJ SOLN
6.2500 mg | INTRAMUSCULAR | Status: DC | PRN
  Administered 2013-08-18: 12.5 mg via INTRAVENOUS

## 2013-08-18 MED ORDER — LIDOCAINE HCL (CARDIAC) 20 MG/ML IV SOLN
INTRAVENOUS | Status: AC
  Filled 2013-08-18: qty 5

## 2013-08-18 MED ORDER — KETOROLAC TROMETHAMINE 30 MG/ML IJ SOLN
INTRAMUSCULAR | Status: AC
  Filled 2013-08-18: qty 1

## 2013-08-18 MED ORDER — ROCURONIUM BROMIDE 100 MG/10ML IV SOLN
INTRAVENOUS | Status: DC | PRN
  Administered 2013-08-18: 30 mg via INTRAVENOUS

## 2013-08-18 MED ORDER — ACETAMINOPHEN 160 MG/5ML PO SOLN
ORAL | Status: AC
  Administered 2013-08-18: 975 mg via ORAL
  Filled 2013-08-18: qty 40.6

## 2013-08-18 MED ORDER — ACETAMINOPHEN 160 MG/5ML PO SOLN
975.0000 mg | Freq: Once | ORAL | Status: AC
  Administered 2013-08-18: 975 mg via ORAL

## 2013-08-18 MED ORDER — FENTANYL CITRATE 0.05 MG/ML IJ SOLN
INTRAMUSCULAR | Status: AC
  Administered 2013-08-18: 50 ug via INTRAVENOUS
  Filled 2013-08-18: qty 2

## 2013-08-18 MED ORDER — MEPERIDINE HCL 25 MG/ML IJ SOLN
INTRAMUSCULAR | Status: AC
  Administered 2013-08-18: 12.5 mg via INTRAVENOUS
  Filled 2013-08-18: qty 1

## 2013-08-18 SURGICAL SUPPLY — 30 items
ADH SKN CLS APL DERMABOND .7 (GAUZE/BANDAGES/DRESSINGS) ×1
CABLE HIGH FREQUENCY MONO STRZ (ELECTRODE) IMPLANT
CATH FOLEY 2WAY  3CC 10FR (CATHETERS)
CATH FOLEY 2WAY 3CC 10FR (CATHETERS) IMPLANT
CATH ROBINSON RED A/P 16FR (CATHETERS) IMPLANT
CHLORAPREP W/TINT 26ML (MISCELLANEOUS) ×3 IMPLANT
CLOTH BEACON ORANGE TIMEOUT ST (SAFETY) ×3 IMPLANT
DECANTER SPIKE VIAL GLASS SM (MISCELLANEOUS) ×3 IMPLANT
DERMABOND ADVANCED (GAUZE/BANDAGES/DRESSINGS) ×2
DERMABOND ADVANCED .7 DNX12 (GAUZE/BANDAGES/DRESSINGS) ×1 IMPLANT
GLOVE BIO SURGEON STRL SZ8 (GLOVE) ×3 IMPLANT
GLOVE ORTHO TXT STRL SZ7.5 (GLOVE) ×3 IMPLANT
GOWN STRL REUS W/TWL LRG LVL3 (GOWN DISPOSABLE) ×6 IMPLANT
NDL EPID 17G 5 ECHO TUOHY (NEEDLE) IMPLANT
NEEDLE EPID 17G 5 ECHO TUOHY (NEEDLE) IMPLANT
NEEDLE INSUFFLATION 120MM (ENDOMECHANICALS) ×3 IMPLANT
NS IRRIG 1000ML POUR BTL (IV SOLUTION) ×3 IMPLANT
PACK LAPAROSCOPY BASIN (CUSTOM PROCEDURE TRAY) ×3 IMPLANT
PROTECTOR NERVE ULNAR (MISCELLANEOUS) ×3 IMPLANT
SET IRRIG TUBING LAPAROSCOPIC (IRRIGATION / IRRIGATOR) IMPLANT
SHEARS HARMONIC ACE PLUS 36CM (ENDOMECHANICALS) IMPLANT
SOLUTION ELECTROLUBE (MISCELLANEOUS) IMPLANT
SUT VICRYL 0 UR6 27IN ABS (SUTURE) IMPLANT
SUT VICRYL 4-0 PS2 18IN ABS (SUTURE) ×3 IMPLANT
TOWEL OR 17X24 6PK STRL BLUE (TOWEL DISPOSABLE) ×6 IMPLANT
TROCAR XCEL NON-BLD 11X100MML (ENDOMECHANICALS) IMPLANT
TROCAR XCEL NON-BLD 5MMX100MML (ENDOMECHANICALS) ×3 IMPLANT
TROCAR XCEL OPT SLVE 5M 100M (ENDOMECHANICALS) IMPLANT
WARMER LAPAROSCOPE (MISCELLANEOUS) ×3 IMPLANT
WATER STERILE IRR 1000ML POUR (IV SOLUTION) ×3 IMPLANT

## 2013-08-18 NOTE — Discharge Instructions (Signed)

## 2013-08-18 NOTE — Op Note (Signed)
Preoperative diagnosis: Pelvic pain  Postoperative diagnosis: Same, possible endometriosis Procedure: Diagnostic laparoscopy, fulguration of possible endometriosis  Surgeon: Cheri Fowler M.D.  Anesthesia: Gen. Endotracheal tube  Findings: She had a normal abdomen and pelvis with normal uterus tubes and ovaries, some small peritoneal abnormalities consistent with endometriosis in each ovarian fossa and possibly a deep lesion near the distal round ligament Specimens: None  Estimated blood loss: Minimal  Complications: None  Procedure in detail:  The patient was taken to the operating room and placed in the dorsosupine position. General anesthesia was induced. Her legs were placed in mobile stirrups and her left arm was tucked to her side. Abdomen, perineum and vagina were then prepped and draped in the usual sterile fashion, bladder drained with a red Robinson catheter, an Acorn cannula and single tooth tenaculum were applied to the cervix for uterine manipulation. Infraumbilical skin was then infiltrated with quarter percent Marcaine and a 1 cm vertical incision was made. The veress needle was inserted into the peritoneal cavity and placement confirmed by the water drop test and an opening pressure of 6 mm of mercury. CO2 was insufflated to a pressure of 12 mm of mercury and the veress needle was removed. A 90mm disposable trocar was then introduced with direct visualization with the laparoscope. A 5 mm port was then placed on the left side also under direct visualization. Careful and thorough inspection revealed the above-mentioned findings with normal anatomy, some areas of possible endometriosis identified as a source for her pain.  All visible superficial areas of probable endometriosis were fulgurated with bipolar cautery.  The biggest lesion, near the distal right round ligament, was deep and close a large blood vessel and so was left alone. No other source for her pain was identified. The 5 mm port  was removed under direct visualization. All gas was allowed to deflate from the abdomen and the umbilical trocar was removed. Skin incisions were then closed with interrupted subcuticular sutures of 4-0 Vicryl followed by Dermabond. The Acorn cannula and tenaculum were removed. The patient was taken down from stirrups. She was awakened in the operating room and taken to the recovery room in stable condition after tolerating the procedure well. Counts were correct and she had PAS hose on throughout the procedure.

## 2013-08-18 NOTE — Interval H&P Note (Signed)
History and Physical Interval Note:  08/18/2013 7:06 AM  [name]  has presented today for surgery, with the diagnosis of Pelvic Pain  The various methods of treatment have been discussed with the patient and family. After consideration of risks, benefits and other options for treatment, the patient has consented to  Procedure(s): LAPAROSCOPY DIAGNOSTIC, possible Operative Laparoscopy (N/A) as a surgical intervention .  The patient's history has been reviewed, patient examined, no change in status, stable for surgery.  I have reviewed the patient's chart and labs.  Questions were answered to the patient's satisfaction.     [employer]

## 2013-08-18 NOTE — Anesthesia Preprocedure Evaluation (Addendum)
Anesthesia Evaluation  Patient identified by MRN, date of birth, ID band Patient awake    Reviewed: Allergy & Precautions, H&P , NPO status , Patient's Chart, lab work & pertinent test results, reviewed documented beta blocker date and time   History of Anesthesia Complications Negative for: history of anesthetic complications  Airway Mallampati: III TM Distance: >3 FB Neck ROM: full  Mouth opening: Limited Mouth Opening  Dental  (+) Teeth Intact   Pulmonary asthma (only uses inhaler when sick (last use several years ago)) ,  breath sounds clear to auscultation  Pulmonary exam normal       Cardiovascular Exercise Tolerance: Good negative cardio ROS  Rhythm:regular Rate:Normal     Neuro/Psych Anxiety Depression negative neurological ROS     GI/Hepatic negative GI ROS, Neg liver ROS,   Endo/Other  negative endocrine ROS  Renal/GU negative Renal ROS  Female GU complaint (on vicodin daily for chronic pelvic pain (for several months))     Musculoskeletal   Abdominal   Peds  Hematology negative hematology ROS (+)   Anesthesia Other Findings toradol allergy  Reproductive/Obstetrics negative OB ROS                          Anesthesia Physical Anesthesia Plan  ASA: II  Anesthesia Plan: General ETT   Post-op Pain Management:    Induction:   Airway Management Planned:   Additional Equipment:   Intra-op Plan:   Post-operative Plan:   Informed Consent: I have reviewed the patients History and Physical, chart, labs and discussed the procedure including the risks, benefits and alternatives for the proposed anesthesia with the patient or authorized representative who has indicated his/her understanding and acceptance.   Dental Advisory Given  Plan Discussed with: CRNA and Surgeon  Anesthesia Plan Comments:         Anesthesia Quick Evaluation

## 2013-08-18 NOTE — Transfer of Care (Signed)
Immediate Anesthesia Transfer of Care Note  Patient: [name]  Procedure(s) Performed: Procedure(s): LAPAROSCOPY DIAGNOSTIC, FULGERATION OF ENDOMETROSIS (N/A)  Patient Location: PACU  Anesthesia Type:General  Level of Consciousness: awake, alert , oriented and patient cooperative  Airway & Oxygen Therapy: Patient Spontanous Breathing and Patient connected to nasal cannula oxygen  Post-op Assessment: Report given to PACU RN and Post -op Vital signs reviewed and stable  Post vital signs: Reviewed and stable  Complications: No apparent anesthesia complications

## 2013-08-21 ENCOUNTER — Encounter (HOSPITAL_COMMUNITY): Payer: Self-pay | Admitting: Obstetrics and Gynecology

## 2013-08-23 NOTE — Anesthesia Postprocedure Evaluation (Signed)
  Anesthesia Post-op Note  Patient: [name]  Procedure(s) Performed: Procedure(s): LAPAROSCOPY DIAGNOSTIC, FULGERATION OF ENDOMETROSIS (N/A) Patient is awake and responsive. Pain and nausea are reasonably well controlled. Vital signs are stable and clinically acceptable. Oxygen saturation is clinically acceptable. There are no apparent anesthetic complications at this time. Patient is ready for discharge.

## 2013-09-19 ENCOUNTER — Ambulatory Visit: Payer: Medicaid Other | Admitting: Physical Therapy

## 2013-09-25 ENCOUNTER — Ambulatory Visit: Payer: Medicaid Other | Attending: Urology | Admitting: Physical Therapy

## 2013-10-27 ENCOUNTER — Other Ambulatory Visit: Payer: Self-pay | Admitting: Urology

## 2013-11-06 ENCOUNTER — Encounter (HOSPITAL_BASED_OUTPATIENT_CLINIC_OR_DEPARTMENT_OTHER): Payer: Self-pay | Admitting: *Deleted

## 2013-11-08 ENCOUNTER — Encounter (HOSPITAL_BASED_OUTPATIENT_CLINIC_OR_DEPARTMENT_OTHER): Payer: Self-pay | Admitting: *Deleted

## 2013-11-08 NOTE — Progress Notes (Signed)
PT STATES SHE CALLED OFFICE AND CANCELLED PROCEDURE.

## 2013-11-15 ENCOUNTER — Ambulatory Visit (HOSPITAL_BASED_OUTPATIENT_CLINIC_OR_DEPARTMENT_OTHER): Admission: RE | Admit: 2013-11-15 | Payer: Medicaid Other | Source: Ambulatory Visit | Admitting: Urology

## 2013-11-15 ENCOUNTER — Encounter (HOSPITAL_BASED_OUTPATIENT_CLINIC_OR_DEPARTMENT_OTHER): Admission: RE | Payer: Self-pay | Source: Ambulatory Visit

## 2013-11-15 HISTORY — DX: Personal history of other diseases of the female genital tract: Z87.42

## 2013-11-15 HISTORY — DX: Personal history of other infectious and parasitic diseases: Z86.19

## 2013-11-15 SURGERY — CYSTOSCOPY, WITH BLADDER HYDRODISTENSION
Anesthesia: General

## 2013-11-22 ENCOUNTER — Other Ambulatory Visit: Payer: Self-pay | Admitting: Urology

## 2013-11-23 ENCOUNTER — Encounter (HOSPITAL_BASED_OUTPATIENT_CLINIC_OR_DEPARTMENT_OTHER): Payer: Self-pay | Admitting: *Deleted

## 2013-11-23 NOTE — Progress Notes (Signed)
PT STATES TO R/S AND LM FOR PAM, OR SCHEDULER AT OFFICE.

## 2013-11-27 ENCOUNTER — Encounter (HOSPITAL_BASED_OUTPATIENT_CLINIC_OR_DEPARTMENT_OTHER): Payer: Self-pay | Admitting: *Deleted

## 2013-11-27 ENCOUNTER — Ambulatory Visit: Payer: Medicaid Other | Attending: Urology | Admitting: Physical Therapy

## 2013-11-27 NOTE — Progress Notes (Signed)
NPO AFTER MN. ARRIVE AT 1115. NEEDS HG.

## 2013-12-01 ENCOUNTER — Encounter (HOSPITAL_BASED_OUTPATIENT_CLINIC_OR_DEPARTMENT_OTHER)
Admission: RE | Disposition: A | Discharge status: Home / Self Care | Payer: Self-pay | Source: Ambulatory Visit | Attending: Urology

## 2013-12-01 ENCOUNTER — Ambulatory Visit (HOSPITAL_BASED_OUTPATIENT_CLINIC_OR_DEPARTMENT_OTHER)
Admission: RE | Admit: 2013-12-01 | Discharge: 2013-12-01 | Disposition: A | Discharge status: Home / Self Care | Payer: Medicaid Other | Source: Ambulatory Visit | Attending: Urology | Admitting: Urology

## 2013-12-01 ENCOUNTER — Ambulatory Visit (HOSPITAL_BASED_OUTPATIENT_CLINIC_OR_DEPARTMENT_OTHER): Payer: Medicaid Other | Admitting: Anesthesiology

## 2013-12-01 ENCOUNTER — Encounter (HOSPITAL_BASED_OUTPATIENT_CLINIC_OR_DEPARTMENT_OTHER): Payer: Medicaid Other | Admitting: Anesthesiology

## 2013-12-01 ENCOUNTER — Encounter (HOSPITAL_BASED_OUTPATIENT_CLINIC_OR_DEPARTMENT_OTHER): Payer: Self-pay | Admitting: Urology

## 2013-12-01 DIAGNOSIS — N949 Unspecified condition associated with female genital organs and menstrual cycle: Secondary | ICD-10-CM | POA: Insufficient documentation

## 2013-12-01 DIAGNOSIS — Z880 Allergy status to penicillin: Secondary | ICD-10-CM | POA: Insufficient documentation

## 2013-12-01 DIAGNOSIS — Z79899 Other long term (current) drug therapy: Secondary | ICD-10-CM | POA: Insufficient documentation

## 2013-12-01 DIAGNOSIS — R102 Pelvic and perineal pain: Secondary | ICD-10-CM

## 2013-12-01 HISTORY — DX: Pelvic and perineal pain: R10.2

## 2013-12-01 HISTORY — PX: CYSTO WITH HYDRODISTENSION: SHX5453

## 2013-12-01 HISTORY — DX: Unspecified mood (affective) disorder: F39

## 2013-12-01 HISTORY — DX: Personal history of other diseases of the respiratory system: Z87.09

## 2013-12-01 LAB — POCT HEMOGLOBIN-HEMACUE: Hemoglobin: 15 g/dL (ref 12.0–15.0)

## 2013-12-01 SURGERY — CYSTOSCOPY, WITH BLADDER HYDRODISTENSION
Anesthesia: General | Site: Bladder

## 2013-12-01 MED ORDER — ACETAMINOPHEN 10 MG/ML IV SOLN
INTRAVENOUS | Status: DC | PRN
  Administered 2013-12-01: 1000 mg via INTRAVENOUS

## 2013-12-01 MED ORDER — HYDROMORPHONE HCL PF 1 MG/ML IJ SOLN
INTRAMUSCULAR | Status: AC
  Filled 2013-12-01: qty 1

## 2013-12-01 MED ORDER — BELLADONNA ALKALOIDS-OPIUM 16.2-60 MG RE SUPP
RECTAL | Status: AC
  Filled 2013-12-01: qty 1

## 2013-12-01 MED ORDER — MIDAZOLAM HCL 2 MG/2ML IJ SOLN
INTRAMUSCULAR | Status: AC
  Filled 2013-12-01: qty 2

## 2013-12-01 MED ORDER — HYDROMORPHONE HCL PF 1 MG/ML IJ SOLN
0.2500 mg | INTRAMUSCULAR | Status: DC | PRN
  Administered 2013-12-01: 0.25 mg via INTRAVENOUS
  Filled 2013-12-01: qty 1

## 2013-12-01 MED ORDER — OXYCODONE HCL 5 MG PO TABS
ORAL_TABLET | ORAL | Status: AC
  Filled 2013-12-01: qty 1

## 2013-12-01 MED ORDER — BUPIVACAINE HCL (PF) 0.5 % IJ SOLN
INTRAMUSCULAR | Status: DC | PRN
  Administered 2013-12-01: 14:00:00 via INTRAVESICAL

## 2013-12-01 MED ORDER — FENTANYL CITRATE 0.05 MG/ML IJ SOLN
INTRAMUSCULAR | Status: AC
  Filled 2013-12-01: qty 4

## 2013-12-01 MED ORDER — LACTATED RINGERS IV SOLN
INTRAVENOUS | Status: DC
  Filled 2013-12-01: qty 1000

## 2013-12-01 MED ORDER — PROMETHAZINE HCL 25 MG/ML IJ SOLN
6.2500 mg | INTRAMUSCULAR | Status: DC | PRN
  Filled 2013-12-01: qty 1

## 2013-12-01 MED ORDER — LACTATED RINGERS IV SOLN
INTRAVENOUS | Status: DC
  Administered 2013-12-01: 12:00:00 via INTRAVENOUS
  Filled 2013-12-01: qty 1000

## 2013-12-01 MED ORDER — PROPOFOL 10 MG/ML IV BOLUS
INTRAVENOUS | Status: DC | PRN
  Administered 2013-12-01: 200 mg via INTRAVENOUS

## 2013-12-01 MED ORDER — MEPERIDINE HCL 25 MG/ML IJ SOLN
6.2500 mg | INTRAMUSCULAR | Status: DC | PRN
  Filled 2013-12-01: qty 1

## 2013-12-01 MED ORDER — ACETAMINOPHEN-CODEINE #3 300-30 MG PO TABS: 1.0000 | ORAL_TABLET | Freq: Every day | ORAL | Status: DC

## 2013-12-01 MED ORDER — OXYCODONE HCL 5 MG PO TABS
5.0000 mg | ORAL_TABLET | Freq: Once | ORAL | Status: AC | PRN
  Administered 2013-12-01: 5 mg via ORAL
  Filled 2013-12-01: qty 1

## 2013-12-01 MED ORDER — FENTANYL CITRATE 0.05 MG/ML IJ SOLN
INTRAMUSCULAR | Status: DC | PRN
Start: 2013-12-01 — End: 2013-12-01
  Administered 2013-12-01 (×4): 50 ug via INTRAVENOUS

## 2013-12-01 MED ORDER — DEXAMETHASONE SODIUM PHOSPHATE 4 MG/ML IJ SOLN
INTRAMUSCULAR | Status: DC | PRN
  Administered 2013-12-01: 10 mg via INTRAVENOUS

## 2013-12-01 MED ORDER — ONDANSETRON HCL 4 MG/2ML IJ SOLN
INTRAMUSCULAR | Status: DC | PRN
  Administered 2013-12-01: 4 mg via INTRAVENOUS

## 2013-12-01 MED ORDER — LIDOCAINE HCL (CARDIAC) 20 MG/ML IV SOLN
INTRAVENOUS | Status: DC | PRN
  Administered 2013-12-01: 100 mg via INTRAVENOUS

## 2013-12-01 MED ORDER — BELLADONNA ALKALOIDS-OPIUM 16.2-60 MG RE SUPP
RECTAL | Status: DC | PRN
  Administered 2013-12-01: 1 via RECTAL

## 2013-12-01 MED ORDER — CIPROFLOXACIN IN D5W 400 MG/200ML IV SOLN
400.0000 mg | INTRAVENOUS | Status: DC
  Filled 2013-12-01: qty 200

## 2013-12-01 MED ORDER — MIDAZOLAM HCL 5 MG/5ML IJ SOLN
INTRAMUSCULAR | Status: DC | PRN
  Administered 2013-12-01: 2 mg via INTRAVENOUS

## 2013-12-01 MED ORDER — OXYCODONE HCL 5 MG/5ML PO SOLN
5.0000 mg | Freq: Once | ORAL | Status: AC | PRN
  Filled 2013-12-01: qty 5

## 2013-12-01 MED ORDER — STERILE WATER FOR IRRIGATION IR SOLN
Status: DC | PRN
  Administered 2013-12-01: 3000 mL

## 2013-12-01 MED ORDER — CIPROFLOXACIN IN D5W 400 MG/200ML IV SOLN
400.0000 mg | INTRAVENOUS | Status: AC
  Administered 2013-12-01: 400 mg via INTRAVENOUS
  Filled 2013-12-01: qty 200

## 2013-12-01 SURGICAL SUPPLY — 19 items
BAG DRAIN URO-CYSTO SKYTR STRL (DRAIN) ×2 IMPLANT
BAG DRN UROCATH (DRAIN) ×1
CANISTER SUCT LVC 12 LTR MEDI- (MISCELLANEOUS) ×2 IMPLANT
CATH ROBINSON RED A/P 14FR (CATHETERS) IMPLANT
CATH ROBINSON RED A/P 16FR (CATHETERS) IMPLANT
CLOTH BEACON ORANGE TIMEOUT ST (SAFETY) ×2 IMPLANT
DRAPE CAMERA CLOSED 9X96 (DRAPES) ×2 IMPLANT
ELECT REM PT RETURN 9FT ADLT (ELECTROSURGICAL) ×2
ELECTRODE REM PT RTRN 9FT ADLT (ELECTROSURGICAL) ×1 IMPLANT
GLOVE BIO SURGEON STRL SZ7.5 (GLOVE) ×2 IMPLANT
GLOVE BIOGEL PI IND STRL 7.5 (GLOVE) IMPLANT
GLOVE BIOGEL PI INDICATOR 7.5 (GLOVE) ×2
GOWN STRL REUS W/TWL XL LVL3 (GOWN DISPOSABLE) ×4 IMPLANT
NDL SAFETY ECLIPSE 18X1.5 (NEEDLE) IMPLANT
NEEDLE HYPO 18GX1.5 SHARP (NEEDLE)
PACK CYSTO (CUSTOM PROCEDURE TRAY) ×2 IMPLANT
PACK CYSTOSCOPY (CUSTOM PROCEDURE TRAY) ×1 IMPLANT
SYR 20CC LL (SYRINGE) ×2 IMPLANT
WATER STERILE IRR 3000ML UROMA (IV SOLUTION) ×2 IMPLANT

## 2013-12-01 NOTE — Discharge Instructions (Signed)
CYSTOSCOPY HOME CARE INSTRUCTIONS  Activity: Rest for the remainder of the day.  Do not drive or operate equipment today.  You may resume normal activities in one to two days as instructed by your physician.   Meals: Drink plenty of liquids and eat light foods such as gelatin or soup this evening.  You may return to a normal meal plan tomorrow.  Return to Work: You may return to work in one to two days or as instructed by your physician.  Special Instructions / Symptoms: Call your physician if any of these symptoms occur:   -persistent or heavy bleeding  -bleeding which continues after first few urination  -large blood clots that are difficult to pass  -urine stream diminishes or stops completely  -fever equal to or higher than 101 degrees Farenheit.  -cloudy urine with a strong, foul odor  -severe pain  Females should always wipe from front to back after elimination.  You may feel some burning pain when you urinate.  This should disappear with time.  Applying moist heat to the lower abdomen or a hot tub bath may help relieve the pain. \  Follow-Up / Date of Return Visit to Your Physician:  *** Call for an appointment to arrange follow-up.  Patient Signature:  ________________________________________________________  Nurse's Signature:  ________________________________________________________     Hydrodistention of the Bladder Hydrodistention is a procedure to examine your bladder. During hydrodistention, your bladder is filled with fluid until it is more full than normal (distended). Your caregiver will use a thin tube with a camera on the end to examine your urethra and bladder (cystoscope). LET YOUR CAREGIVER KNOW ABOUT:  Any allergies you have.  All medicines you are taking, including vitamins, herbs, eyedrops, and over-the-counter medicines and creams.  Previous problems you or members of your family have had with the use of anesthetics.  Any blood disorders you  have.  Other health problems you have. RISKS AND COMPLICATIONS Generally, hydrodistention is a safe procedure. However, as with any surgical procedure, complications can occur. Possible complications associated with hydrodistention include:  Bleeding.  Infection.  Bladder puncture.  Difficulty urinating.  Temporary inability to urinate. If this happens you may need a tube to drain urine from your bladder outside of your body (urinary catheter) for a short period after your procedure. BEFORE THE PROCEDURE  Do not eat or drink anything for at least 6 hours before your procedure.  Make plans for someone to drive you home after the procedure. PROCEDURE Hydrodistention of the bladder is an outpatient procedure. You will not need to stay overnight. It may be done in a hospital or in a surgery clinic. It usually takes about 30 minutes.  Small monitors will be placed on your body. They are used to check your heart rate, blood pressure level, and oxygen level during the procedure. An intravenous (IV) tube will be inserted into one of your veins. Fluids and medicine will flow directly into your body through the IV tube. You may be given medicine to make you sleep (general anesthetic) or you may be given medicine that makes you numb from the waist down (spinal anesthesia). Your caregiver will gently glide the cystoscope into the tube through which urine flows out of your body (urethra) all the way to your bladder. Once in your bladder, fluid will flow through the cystoscope until your bladder is full. Your caregiver will measure the amount of fluid it takes to fill your bladder. If your caregiver notices any abnormal tissue  in your bladder, a tissue sample will be removed and sent to a lab to be examined (biopsy). When your caregiver has finished the exam, the fluid will be drained from your bladder and the cystoscope will be removed. AFTER THE PROCEDURE You will stay in a recovery area until the  anesthetic wears off. Your pulse and blood pressure will be frequently monitored until you are stable. Then you can go home. Document Released: 12/23/2011 Document Reviewed: 12/23/2011 [facility] Patient Information 2015 [place]. This information is not intended to replace advice given to you by your health care provider. Make sure you discuss any questions you have with your health care provider.      Post Anesthesia Home Care Instructions  Activity: Get plenty of rest for the remainder of the day. A responsible adult should stay with you for 24 hours following the procedure.  For the next 24 hours, DO NOT: -Drive a car -Paediatric nurse -Drink alcoholic beverages -Take any medication unless instructed by your physician -Make any legal decisions or sign important papers.  Meals: Start with liquid foods such as gelatin or soup. Progress to regular foods as tolerated. Avoid greasy, spicy, heavy foods. If nausea and/or vomiting occur, drink only clear liquids until the nausea and/or vomiting subsides. Call your physician if vomiting continues.  Special Instructions/Symptoms: Your throat may feel dry or sore from the anesthesia or the breathing tube placed in your throat during surgery. If this causes discomfort, gargle with warm salt water. The discomfort should disappear within 24 hours.

## 2013-12-01 NOTE — Op Note (Signed)
Preoperative diagnosis:  1. Chronic Pelvic pain   Postoperative diagnosis:  1. same   Procedure: 1. Cystoscopy 2. hydrodistention  Surgeon: Ardis Hughs, MD  Anesthesia: General  Complications: None  Intraoperative findings: 850cc bladder capacity, minimal erythematous changes following HOD  EBL: Minimal  Specimens: None  Indication: [name] is a 28 y.o. patient with chronic pelvic pain.  We discussed HOD once other treatment options had failed.  After reviewing the management options for treatment, he elected to proceed with the above surgical procedure(s). We have discussed the potential benefits and risks of the procedure, side effects of the proposed treatment, the likelihood of the patient achieving the goals of the procedure, and any potential problems that might occur during the procedure or recuperation. Informed consent has been obtained.  Description of procedure:  The patient was taken to the operating room and general anesthesia was induced.  The patient was placed in the dorsal lithotomy position, prepped and draped in the usual sterile fashion, and preoperative antibiotics were administered. A preoperative time-out was performed.   47F cystoscopy was then gently passed into the patients bladder under visual guidance.  360-degree cystoscopic eval was performed with no unusal findings.  Her bladder was then filled until the NS hung at 80cm above her hip stopped dripping.  This was then empited and measured at 850cc.  The bladder was then refilled and kept distended for 3 minutes.  There were no hunner lesions or glomerulations.  The bladder was then emptied and 30cc of 1/2 percent Marcaine with pyridium was injected into her bladder.  She was subsequently extubated and returned to the PACU in stable condition.  Ardis Hughs, M.D.

## 2013-12-01 NOTE — Interval H&P Note (Signed)
History and Physical Interval Note:  12/01/2013 1:07 PM  [name]  has presented today for surgery, with the diagnosis of PELVIC PAIN   The various methods of treatment have been discussed with the patient and family. After consideration of risks, benefits and other options for treatment, the patient has consented to  Procedure(s): CYSTOSCOPY/HYDRODISTENSION (N/A) as a surgical intervention .  The patient's history has been reviewed, patient examined, no change in status, stable for surgery.  I have reviewed the patient's chart and labs.  Questions were answered to the patient's satisfaction.     Louis Meckel W

## 2013-12-01 NOTE — H&P (Signed)
Reason For Visit f/u for pelvic pain   History of Present Illness 55F referred by Dr. Cheri Fowler, MD for eval and management of pelvic pain. The patient has had this pain for a while. She underwent a diagnostic laparoscopy and minimal amt of endometriosis was fulgarated but her pain persists. She is taking narcotics for the pain.    The patient's symptoms began roughly 9 months ago. The pain is located in the lower right quadrant predominantly, at times it is midline. She has significant pain with intercourse. She does not complain of dysuria, but states that her pain is worse when her bladder is full. She denies any issues with constipation although she only has bowel movements every other day. She also complains of frequent urination, she voids approximately 3 times per hour. She gets up 3 times at night to keep. She denies any urinary urge incontinence. She often has to strain to get her stream started but once going she has a strong stream and does not have to start or stop midstream. She has noted some blood in her urine. She has noticed this a few times over the last several months. This is not a vent associated with infection. She does not have a history of recurrent urinary tract infections. Her last infection was several years ago. The patient drinks 4-5 cups of coffee a day, at least 1 can of Dr. Malachi Bonds daily, and some water with limited on a typical day. The patient has had 2 spontaneous vaginal deliveries, she has not had any GU surgeries. She denies any history of neurological diseases. Kidney stones do run in her family, as well as her grandfather was recently diagnosed with renal cell carcinoma.     Treatment: bowel clean-out, daily miralax, vagina valium QHS, Vesicare, T#3 for pain, physcial therapy (does not qualify for more than 1 session).  Interval: Has been taking alot of pain medication. Requiring frequent refills on T#3. Entered into narcotic contract with me for 1 yr. the  patient tells me that she now is on a regular bowel regimen and has one bowel movement daily. The vaginal volume seems to have improved her dyspareunia. The Vesicare is helping with her frequency. However, she still continues to have significant pain associated with bladder filling and emptying.    Renal ultrasound was obtained today to complete her hematuria evaluation. She denies any ongoing or visible bleeding.   Surgical History Problems  1. History of Dental Surgery 2. History of Laparoscopy (Diagnostic) 3. History of Tubal Ligation  Current Meds 1. Diazepam 10 MG Oral Tablet; TAKE 1 TABLET Daily insert into Vagina prior to bedtime;  Therapy: [id number] to (Evaluate:02Sep2015); Last Rx:04Jun2015 Ordered 2. Paxil 40 MG Oral Tablet;  Therapy: (Recorded:04Jun2015) to Recorded 3. VESIcare 10 MG Oral Tablet; Take 1 tablet daily;  Therapy: [phone] to (Evaluate:02Dec2015)  Requested for: [phone]; Last  Rx:05Jun2015 Ordered 4. Wellbutrin SR 150 MG Oral Tablet Extended Release 12 Hour;  Therapy: (Recorded:04Jun2015) to Recorded  Allergies Medication  1. Penicillins  Family History Problems  1. Family history of kidney cancer (V16.51) : Grandfather 2. Family history of kidney stones (V18.69) : Grandfather 3. Family history of renal failure (V18.69) : Grandfather 4. No pertinent family history : Mother  Social History Problems  1. Denied: History of Alcohol use 2. Caffeine use (V49.89) 3. Divorced 4. Never a smoker 5. Number of children   1 son 6. Occupation   Ship broker  Review of Systems No changes in pts bowel habits, neurological changes,  or progressive lower urinary tract symptoms.    Vitals Vital Signs [Data Includes: Last 1 Day]  Recorded: [id number] 08:46AM  Blood Pressure: 124 / 75 Temperature: 98.2 F Heart Rate: 90  Results/Data Urine [Data Includes: Last 1 Day]   [id number]  COLOR YELLOW   APPEARANCE CLEAR   SPECIFIC GRAVITY 1.025   pH 6.0    GLUCOSE 250 mg/dL  BILIRUBIN NEG   KETONE NEG mg/dL  BLOOD TRACE   PROTEIN TRACE mg/dL  UROBILINOGEN 0.2 mg/dL  NITRITE NEG   LEUKOCYTE ESTERASE NEG   SQUAMOUS EPITHELIAL/HPF FEW   WBC NONE SEEN WBC/hpf  RBC 0-2 RBC/hpf  BACTERIA RARE   CRYSTALS NONE SEEN   CASTS NONE SEEN    Renal ultrasound obtained in clinic today as part of a hematuria workup. The right kidney measures 11.22 cm in length of the cortical thickness of 1.45 cm. There is no mass, stones, or hydronephrosis on the right side. The left side measures 11.5 cm in length with the cortical thickness 1.6 cm. The left has no masses, stones, or hydronephrosis. The patient's post void residual in her bladder is 8.9 cc. Her renal ultrasound is normal, no evidence of no explanation for her microscopic hematuria   Assessment Assessed  1. Gross hematuria (599.71) 2. Pelvic pain in female (625.9)  Plan Pelvic pain in female  1. Start: Acetaminophen-Codeine 300-30 MG Oral Tablet; TAKE 1 TABLET TWICE DAILY AS  NEEDED FOR PAIN 2. Follow-up Schedule Surgery Office  Follow-up  Status: Complete  Done: [id number]  Discussion/Summary #1 microscopic hematuria-the patient has been completely evaluated for hematuria including cystoscopy and renal ultrasound. These were all normal, there is no clear etiology for her microscopic hematuria.  #2 pelvic pain-we discussed narcotics to treat her pelvic pain. I told her I do not think this is a Chief Strategy Officer. I recommended that we switched amitriptyline. In addition, we discussed hydrodistention, the patient would like to proceed. I told her that this may not improve her situation, but it might be worth trying if everything else is failed. Bladder instillations have not improved her situation at all. I also reiterated the importance of pelvic floor physical therapy and recommended that she at least visit her physical therapist for the allotted number of treatments. We will plan to continue with  vaginal Valium and Vesicare. I'm gone over the risks and benefits of hydrodistention. They have discussed alternate modalities. Gone over the expected recovery time the patient. After understanding the details of procedure she would like to proceed.

## 2013-12-01 NOTE — Anesthesia Preprocedure Evaluation (Signed)
Anesthesia Evaluation  Patient identified by MRN, date of birth, ID band Patient awake    Reviewed: Allergy & Precautions, H&P , NPO status , Patient's Chart, lab work & pertinent test results, reviewed documented beta blocker date and time   History of Anesthesia Complications Negative for: history of anesthetic complications  Airway Mallampati: III TM Distance: >3 FB Neck ROM: full  Mouth opening: Limited Mouth Opening  Dental  (+) Teeth Intact   Pulmonary asthma (only uses inhaler when sick (last use several years ago)) , former smoker,  breath sounds clear to auscultation  Pulmonary exam normal       Cardiovascular Exercise Tolerance: Good negative cardio ROS  Rhythm:regular Rate:Normal     Neuro/Psych Anxiety Depression negative neurological ROS     GI/Hepatic negative GI ROS, Neg liver ROS,   Endo/Other  negative endocrine ROS  Renal/GU negative Renal ROS  Female GU complaint (on vicodin daily for chronic pelvic pain (for several months))     Musculoskeletal   Abdominal   Peds  Hematology negative hematology ROS (+)   Anesthesia Other Findings toradol allergy  Reproductive/Obstetrics negative OB ROS                           Anesthesia Physical  Anesthesia Plan  ASA: II  Anesthesia Plan: General   Post-op Pain Management:    Induction: Intravenous  Airway Management Planned: LMA  Additional Equipment:   Intra-op Plan:   Post-operative Plan: Extubation in OR  Informed Consent: I have reviewed the patients History and Physical, chart, labs and discussed the procedure including the risks, benefits and alternatives for the proposed anesthesia with the patient or authorized representative who has indicated his/her understanding and acceptance.   Dental Advisory Given  Plan Discussed with: CRNA  Anesthesia Plan Comments:         Anesthesia Quick Evaluation

## 2013-12-01 NOTE — Anesthesia Procedure Notes (Signed)
Procedure Name: LMA Insertion Date/Time: 12/01/2013 1:16 PM Performed by: Mechele Claude Pre-anesthesia Checklist: Patient identified, Emergency Drugs available, Suction available and Patient being monitored Patient Re-evaluated:Patient Re-evaluated prior to inductionOxygen Delivery Method: Circle System Utilized Preoxygenation: Pre-oxygenation with 100% oxygen Intubation Type: IV induction Ventilation: Mask ventilation without difficulty LMA: LMA inserted LMA Size: 4.0 Number of attempts: 1 Airway Equipment and Method: bite block Placement Confirmation: positive ETCO2 Tube secured with: Tape Dental Injury: Teeth and Oropharynx as per pre-operative assessment

## 2013-12-01 NOTE — Anesthesia Postprocedure Evaluation (Signed)
  Anesthesia Post-op Note  Patient: [name]  Procedure(s) Performed: Procedure(s): CYSTOSCOPY/HYDRODISTENSION with instillation of marcaine and pyridium (N/A)  Patient Location: PACU  Anesthesia Type:General  Level of Consciousness: awake, alert  and oriented  Airway and Oxygen Therapy: Patient Spontanous Breathing  Post-op Pain: none  Post-op Assessment: Post-op Vital signs reviewed  Post-op Vital Signs: Reviewed  Last Vitals:  Filed Vitals:   12/01/13 1525  BP: 122/84  Pulse: 60  Temp: 36.7 C  Resp: 18    Complications: No apparent anesthesia complications

## 2013-12-01 NOTE — Transfer of Care (Signed)
Immediate Anesthesia Transfer of Care Note  Patient: [name]  Procedure(s) Performed: Procedure(s) (LRB): CYSTOSCOPY/HYDRODISTENSION with instillation of marcaine and pyridium (N/A)  Patient Location: PACU  Anesthesia Type: General  Level of Consciousness: awake, alert  and oriented  Airway & Oxygen Therapy: Patient Spontanous Breathing and Patient connected to nasal cannula oxygen  Post-op Assessment: Report given to PACU RN and Post -op Vital signs reviewed and stable  Post vital signs: Reviewed and stable  Complications: No apparent anesthesia complications

## 2013-12-04 ENCOUNTER — Encounter (HOSPITAL_BASED_OUTPATIENT_CLINIC_OR_DEPARTMENT_OTHER): Payer: Self-pay | Admitting: Urology

## 2014-02-12 ENCOUNTER — Encounter (HOSPITAL_BASED_OUTPATIENT_CLINIC_OR_DEPARTMENT_OTHER): Payer: Self-pay | Admitting: Urology

## 2014-06-14 NOTE — Patient Instructions (Signed)
   Your procedure is scheduled on:  Monday, March 14  Enter through the Main Entrance of [facility] at: 8 AM Pick up the phone at the desk and dial [phone] and inform us of your arrival.  Please call this number if you have any problems the morning of surgery: [phone]  Remember: Do not eat or drink after midnight: Sunday Take these medicines the morning of surgery with a SIP OF WATER:  Do not wear jewelry, make-up, or FINGER nail polish No metal in your hair or on your body. Do not wear lotions, powders, perfumes.  You may wear deodorant.  Do not bring valuables to the hospital. Contacts, dentures or bridgework may not be worn into surgery.  Leave suitcase in the car. After Surgery it may be brought to your room. For patients being admitted to the hospital, checkout time is 11:00am the day of discharge.

## 2014-06-15 ENCOUNTER — Inpatient Hospital Stay (HOSPITAL_COMMUNITY)
Admission: RE | Admit: 2014-06-15 | Discharge: 2014-06-15 | Disposition: A | Discharge status: Home / Self Care | Payer: Medicaid Other | Source: Ambulatory Visit

## 2014-06-18 NOTE — Patient Instructions (Addendum)
   Your procedure is scheduled on:  Monday, March 14  Enter through the Main Entrance of [facility] at: 8 AM Pick up the phone at the desk and dial [phone] and inform us of your arrival.  Please call this number if you have any problems the morning of surgery: [phone]  Remember: Do not eat or drink after midnight: Sunday Take these medicines the morning of surgery with a SIP OF WATER:  wellbutrin  Do not wear jewelry, make-up, or FINGER nail polish No metal in your hair or on your body. Do not wear lotions, powders, perfumes.  You may wear deodorant.  Do not bring valuables to the hospital. Contacts, dentures or bridgework may not be worn into surgery.  Leave suitcase in the car. After Surgery it may be brought to your room. For patients being admitted to the hospital, checkout time is 11:00am the day of discharge.  Home with mother Burman Nieves cell [phone]

## 2014-06-19 ENCOUNTER — Encounter (HOSPITAL_COMMUNITY): Payer: Self-pay

## 2014-06-19 ENCOUNTER — Encounter (HOSPITAL_COMMUNITY)
Admission: RE | Admit: 2014-06-19 | Discharge: 2014-06-19 | Disposition: A | Discharge status: Home / Self Care | Payer: Medicaid Other | Source: Ambulatory Visit | Attending: Obstetrics and Gynecology | Admitting: Obstetrics and Gynecology

## 2014-06-19 DIAGNOSIS — N920 Excessive and frequent menstruation with regular cycle: Secondary | ICD-10-CM | POA: Diagnosis not present

## 2014-06-19 DIAGNOSIS — N946 Dysmenorrhea, unspecified: Secondary | ICD-10-CM | POA: Diagnosis not present

## 2014-06-19 DIAGNOSIS — Z01818 Encounter for other preprocedural examination: Secondary | ICD-10-CM | POA: Diagnosis not present

## 2014-06-19 HISTORY — DX: Anxiety disorder, unspecified: F41.9

## 2014-06-19 HISTORY — DX: Depression, unspecified: F32.A

## 2014-06-19 HISTORY — DX: Personal history of urinary calculi: Z87.442

## 2014-06-19 HISTORY — DX: Major depressive disorder, single episode, unspecified: F32.9

## 2014-06-19 LAB — CBC
HCT: 39.2 % (ref 36.0–46.0)
Hemoglobin: 13.1 g/dL (ref 12.0–15.0)
MCH: 33.7 pg (ref 26.0–34.0)
MCHC: 33.4 g/dL (ref 30.0–36.0)
MCV: 100.8 fL — ABNORMAL HIGH (ref 78.0–100.0)
Platelets: 303 10*3/uL (ref 150–400)
RBC: 3.89 MIL/uL (ref 3.87–5.11)
RDW: 14.9 % (ref 11.5–15.5)
WBC: 6.6 10*3/uL (ref 4.0–10.5)

## 2014-06-23 NOTE — Anesthesia Preprocedure Evaluation (Addendum)
Anesthesia Evaluation  Patient identified by MRN, date of birth, ID band Patient awake    Reviewed: Allergy & Precautions, NPO status , Patient's Chart, lab work & pertinent test results  History of Anesthesia Complications Negative for: history of anesthetic complications  Airway Mallampati: II  TM Distance: >3 FB Neck ROM: Full    Dental no notable dental hx. (+) Dental Advisory Given   Pulmonary asthma (childhood) , former smoker,  breath sounds clear to auscultation  Pulmonary exam normal       Cardiovascular negative cardio ROS  Rhythm:Regular Rate:Normal     Neuro/Psych PSYCHIATRIC DISORDERS Anxiety Depression negative neurological ROS     GI/Hepatic negative GI ROS, Neg liver ROS,   Endo/Other  obesity  Renal/GU negative Renal ROS  Female GU complaint Chronic pelvic pain     Musculoskeletal negative musculoskeletal ROS (+)   Abdominal   Peds negative pediatric ROS (+)  Hematology negative hematology ROS (+)   Anesthesia Other Findings   Reproductive/Obstetrics negative OB ROS                           Anesthesia Physical Anesthesia Plan  ASA: II  Anesthesia Plan: General   Post-op Pain Management:    Induction: Intravenous  Airway Management Planned: Oral ETT  Additional Equipment:   Intra-op Plan:   Post-operative Plan: Extubation in OR  Informed Consent: I have reviewed the patients History and Physical, chart, labs and discussed the procedure including the risks, benefits and alternatives for the proposed anesthesia with the patient or authorized representative who has indicated his/her understanding and acceptance.   Dental advisory given  Plan Discussed with: CRNA  Anesthesia Plan Comments:         Anesthesia Quick Evaluation

## 2014-06-24 NOTE — H&P (Signed)
[name] is an 29 y.o. female.  She has been followed for chronic pelvic pain, menorrhagia and dysmenorrhea.  She has had a laparoscopy and tried multiple medical therapies, she now wishes to proceed with definitive surgical therapy despite her young age.  She also has dyspareunia with vaginismus being treated with kegels and relaxation as well as valium suppositories.  Pertinent Gynecological History: Last pap: normal Date: 04/2013 OB History: G3, P2012 SVD at term x 2   Menstrual History: No LMP recorded.    Past Medical History  Diagnosis Date  . Endometriosis   . History of pelvic inflammatory disease   . History of genital warts   . Pelvic pain in female   . Mood disorder     UNSPECIFIED  . History of asthma     with bronchitis  . SVD (spontaneous vaginal delivery)   . Depression   . Anxiety   . History of kidney stones     passed stones, no surgery required    Past Surgical History  Procedure Laterality Date  . Ablation colpoclesis    . Laparoscopy N/A 08/18/2013    Procedure: LAPAROSCOPY DIAGNOSTIC, FULGERATION OF ENDOMETROSIS;  Surgeon: Cheri Fowler, MD;  Location: [facility];  Service: Gynecology;  Laterality: N/A;  . Dx laparoscopy/ aspiration right ovarian cyst and bx posterior cul-de-sac  05-27-2005  . Essure tubal ligation Bilateral 2011  . Cysto with hydrodistension N/A 12/01/2013    Procedure: CYSTOSCOPY/HYDRODISTENSION with instillation of marcaine and pyridium;  Surgeon: Ardis Hughs, MD;  Location: [facility];  Service: Urology;  Laterality: N/A;  . Wisdom tooth extraction      No family history on file.  Social History:  reports that she quit smoking about 4 years ago. Her smoking use included Cigarettes. She has a 2 pack-year smoking history. She has never used smokeless tobacco. She reports that she does not drink alcohol or use illicit drugs.  Allergies:  Allergies  Allergen Reactions  . Biaxin [Clarithromycin] Rash  .  Penicillins Rash  . Toradol [Ketorolac Tromethamine] Rash    Tolerates ibuprofen    No prescriptions prior to admission    Review of Systems  Respiratory: Negative.   Cardiovascular: Negative.   Gastrointestinal: Negative.   Genitourinary: Negative.     There were no vitals taken for this visit. Physical Exam  Constitutional: She appears well-developed and well-nourished.  Neck: Neck supple. No thyromegaly present.  Cardiovascular: Normal rate, regular rhythm and normal heart sounds.   No murmur heard. Respiratory: Effort normal and breath sounds normal. No respiratory distress. She has no wheezes.  GI: Soft. She exhibits no distension and no mass. There is no tenderness.  Genitourinary: Vagina normal.  Uterus normal size Mild bilateral adnexal tenderness, no mass    No results found for this or any previous visit (from the past 24 hour(s)).  No results found.  Assessment/Plan: Chronic pelvic pain, menorrhagia, dysmenorrhea and dyspareunia who has failed medical therapy needing chronic pain medication.  All medical and surgical options have been discussed, she wants to proceed with definitive surgical therapy.  Surgical procedure, risks, chances of relieving symptoms have all been discussed.  Will admit for LAVH, bilateral salpingectomy, possible cystotomy.  Myra Weng D 06/24/2014, 6:24 PM

## 2014-06-25 ENCOUNTER — Encounter (HOSPITAL_COMMUNITY)
Admission: RE | Disposition: A | Discharge status: Home / Self Care | Payer: Self-pay | Source: Ambulatory Visit | Attending: Obstetrics and Gynecology

## 2014-06-25 ENCOUNTER — Ambulatory Visit (HOSPITAL_COMMUNITY): Payer: Medicaid Other | Admitting: Anesthesiology

## 2014-06-25 ENCOUNTER — Encounter (HOSPITAL_COMMUNITY): Payer: Self-pay | Admitting: Anesthesiology

## 2014-06-25 ENCOUNTER — Ambulatory Visit (HOSPITAL_COMMUNITY)
Admission: RE | Admit: 2014-06-25 | Discharge: 2014-06-25 | Disposition: A | Discharge status: Home / Self Care | Payer: Medicaid Other | Source: Ambulatory Visit | Attending: Obstetrics and Gynecology | Admitting: Obstetrics and Gynecology

## 2014-06-25 DIAGNOSIS — F419 Anxiety disorder, unspecified: Secondary | ICD-10-CM | POA: Diagnosis not present

## 2014-06-25 DIAGNOSIS — F329 Major depressive disorder, single episode, unspecified: Secondary | ICD-10-CM | POA: Insufficient documentation

## 2014-06-25 DIAGNOSIS — Z888 Allergy status to other drugs, medicaments and biological substances status: Secondary | ICD-10-CM | POA: Diagnosis not present

## 2014-06-25 DIAGNOSIS — N946 Dysmenorrhea, unspecified: Secondary | ICD-10-CM | POA: Insufficient documentation

## 2014-06-25 DIAGNOSIS — N920 Excessive and frequent menstruation with regular cycle: Secondary | ICD-10-CM | POA: Insufficient documentation

## 2014-06-25 DIAGNOSIS — Z881 Allergy status to other antibiotic agents status: Secondary | ICD-10-CM | POA: Insufficient documentation

## 2014-06-25 DIAGNOSIS — J45909 Unspecified asthma, uncomplicated: Secondary | ICD-10-CM | POA: Insufficient documentation

## 2014-06-25 DIAGNOSIS — Z87891 Personal history of nicotine dependence: Secondary | ICD-10-CM | POA: Diagnosis not present

## 2014-06-25 DIAGNOSIS — N941 Dyspareunia: Secondary | ICD-10-CM | POA: Diagnosis not present

## 2014-06-25 DIAGNOSIS — Z880 Allergy status to penicillin: Secondary | ICD-10-CM | POA: Insufficient documentation

## 2014-06-25 DIAGNOSIS — Z87442 Personal history of urinary calculi: Secondary | ICD-10-CM | POA: Diagnosis not present

## 2014-06-25 DIAGNOSIS — R102 Pelvic and perineal pain: Secondary | ICD-10-CM | POA: Insufficient documentation

## 2014-06-25 DIAGNOSIS — Z90710 Acquired absence of both cervix and uterus: Secondary | ICD-10-CM | POA: Diagnosis present

## 2014-06-25 HISTORY — PX: BILATERAL SALPINGECTOMY: SHX5743

## 2014-06-25 HISTORY — PX: CYSTOSCOPY: SHX5120

## 2014-06-25 HISTORY — PX: LAPAROSCOPIC ASSISTED VAGINAL HYSTERECTOMY: SHX5398

## 2014-06-25 LAB — PREGNANCY, URINE: Preg Test, Ur: NEGATIVE

## 2014-06-25 SURGERY — HYSTERECTOMY, VAGINAL, LAPAROSCOPY-ASSISTED
Anesthesia: General

## 2014-06-25 MED ORDER — IBUPROFEN 600 MG PO TABS
600.0000 mg | ORAL_TABLET | Freq: Four times a day (QID) | ORAL | Status: DC | PRN
  Administered 2014-06-25: 600 mg via ORAL
  Filled 2014-06-25: qty 1

## 2014-06-25 MED ORDER — MENTHOL 3 MG MT LOZG: 1.0000 | LOZENGE | OROMUCOSAL | Status: DC | PRN

## 2014-06-25 MED ORDER — PROPOFOL 10 MG/ML IV BOLUS
INTRAVENOUS | Status: DC | PRN
  Administered 2014-06-25: 200 mg via INTRAVENOUS

## 2014-06-25 MED ORDER — MIDAZOLAM HCL 2 MG/2ML IJ SOLN
INTRAMUSCULAR | Status: DC | PRN
  Administered 2014-06-25: 2 mg via INTRAVENOUS

## 2014-06-25 MED ORDER — CEFAZOLIN SODIUM-DEXTROSE 2-3 GM-% IV SOLR
2.0000 g | INTRAVENOUS | Status: AC
  Administered 2014-06-25: 2 g via INTRAVENOUS

## 2014-06-25 MED ORDER — BUPIVACAINE HCL (PF) 0.5 % IJ SOLN
INTRAMUSCULAR | Status: AC
  Filled 2014-06-25: qty 30

## 2014-06-25 MED ORDER — BUPROPION HCL 100 MG PO TABS
100.0000 mg | ORAL_TABLET | Freq: Two times a day (BID) | ORAL | Status: DC
  Filled 2014-06-25 (×3): qty 1

## 2014-06-25 MED ORDER — GLYCOPYRROLATE 0.2 MG/ML IJ SOLN
INTRAMUSCULAR | Status: AC
  Filled 2014-06-25: qty 3

## 2014-06-25 MED ORDER — KETOROLAC TROMETHAMINE 30 MG/ML IJ SOLN
INTRAMUSCULAR | Status: AC
  Filled 2014-06-25: qty 1

## 2014-06-25 MED ORDER — LACTATED RINGERS IV SOLN
INTRAVENOUS | Status: DC
  Administered 2014-06-25 (×2): via INTRAVENOUS

## 2014-06-25 MED ORDER — DIPHENHYDRAMINE HCL 50 MG/ML IJ SOLN
INTRAMUSCULAR | Status: AC
  Filled 2014-06-25: qty 1

## 2014-06-25 MED ORDER — ONDANSETRON HCL 4 MG PO TABS: 4.0000 mg | ORAL_TABLET | Freq: Four times a day (QID) | ORAL | Status: DC | PRN

## 2014-06-25 MED ORDER — NEOSTIGMINE METHYLSULFATE 10 MG/10ML IV SOLN
INTRAVENOUS | Status: DC | PRN
Start: 2014-06-25 — End: 2014-06-25
  Administered 2014-06-25: 4 mg via INTRAVENOUS

## 2014-06-25 MED ORDER — ROCURONIUM BROMIDE 100 MG/10ML IV SOLN
INTRAVENOUS | Status: AC
  Filled 2014-06-25: qty 1

## 2014-06-25 MED ORDER — BUPIVACAINE HCL (PF) 0.25 % IJ SOLN
INTRAMUSCULAR | Status: AC
  Filled 2014-06-25: qty 30

## 2014-06-25 MED ORDER — DIPHENHYDRAMINE HCL 50 MG/ML IJ SOLN
12.5000 mg | Freq: Once | INTRAMUSCULAR | Status: AC
  Administered 2014-06-25: 12.5 mg via INTRAVENOUS

## 2014-06-25 MED ORDER — FENTANYL CITRATE 0.05 MG/ML IJ SOLN
INTRAMUSCULAR | Status: AC
  Filled 2014-06-25: qty 5

## 2014-06-25 MED ORDER — LIDOCAINE HCL (CARDIAC) 20 MG/ML IV SOLN
INTRAVENOUS | Status: AC
  Filled 2014-06-25: qty 5

## 2014-06-25 MED ORDER — ROCURONIUM BROMIDE 100 MG/10ML IV SOLN
INTRAVENOUS | Status: DC | PRN
  Administered 2014-06-25: 50 mg via INTRAVENOUS

## 2014-06-25 MED ORDER — VASOPRESSIN 20 UNIT/ML IV SOLN
INTRAVENOUS | Status: DC | PRN
  Administered 2014-06-25: 17 mL via INTRAMUSCULAR

## 2014-06-25 MED ORDER — FENTANYL CITRATE 0.05 MG/ML IJ SOLN
INTRAMUSCULAR | Status: AC
  Filled 2014-06-25: qty 2

## 2014-06-25 MED ORDER — SCOPOLAMINE 1 MG/3DAYS TD PT72
MEDICATED_PATCH | TRANSDERMAL | Status: AC
  Administered 2014-06-25: 1.5 mg via TRANSDERMAL
  Filled 2014-06-25: qty 1

## 2014-06-25 MED ORDER — FENTANYL CITRATE 0.05 MG/ML IJ SOLN
25.0000 ug | INTRAMUSCULAR | Status: DC | PRN
  Administered 2014-06-25: 50 ug via INTRAVENOUS
  Administered 2014-06-25: 25 ug via INTRAVENOUS
  Administered 2014-06-25: 50 ug via INTRAVENOUS
  Administered 2014-06-25: 25 ug via INTRAVENOUS

## 2014-06-25 MED ORDER — SIMETHICONE 80 MG PO CHEW
80.0000 mg | CHEWABLE_TABLET | Freq: Four times a day (QID) | ORAL | Status: DC | PRN
  Administered 2014-06-25: 80 mg via ORAL
  Filled 2014-06-25: qty 1

## 2014-06-25 MED ORDER — NEOSTIGMINE METHYLSULFATE 10 MG/10ML IV SOLN
INTRAVENOUS | Status: AC
  Filled 2014-06-25: qty 1

## 2014-06-25 MED ORDER — ONDANSETRON HCL 4 MG/2ML IJ SOLN: 4.0000 mg | Freq: Once | INTRAMUSCULAR | Status: DC | PRN

## 2014-06-25 MED ORDER — ONDANSETRON HCL 4 MG/2ML IJ SOLN
INTRAMUSCULAR | Status: DC | PRN
  Administered 2014-06-25: 4 mg via INTRAVENOUS

## 2014-06-25 MED ORDER — ONDANSETRON HCL 4 MG/2ML IJ SOLN
INTRAMUSCULAR | Status: AC
  Filled 2014-06-25: qty 2

## 2014-06-25 MED ORDER — PROPOFOL 10 MG/ML IV BOLUS
INTRAVENOUS | Status: AC
  Filled 2014-06-25: qty 20

## 2014-06-25 MED ORDER — DEXAMETHASONE SODIUM PHOSPHATE 4 MG/ML IJ SOLN
INTRAMUSCULAR | Status: AC
  Filled 2014-06-25: qty 1

## 2014-06-25 MED ORDER — ALBUTEROL SULFATE HFA 108 (90 BASE) MCG/ACT IN AERS
INHALATION_SPRAY | RESPIRATORY_TRACT | Status: DC | PRN
  Administered 2014-06-25: 2 via RESPIRATORY_TRACT

## 2014-06-25 MED ORDER — SCOPOLAMINE 1 MG/3DAYS TD PT72
1.0000 | MEDICATED_PATCH | Freq: Once | TRANSDERMAL | Status: DC
  Administered 2014-06-25: 1.5 mg via TRANSDERMAL

## 2014-06-25 MED ORDER — INFLUENZA VAC SPLIT QUAD 0.5 ML IM SUSY
0.5000 mL | PREFILLED_SYRINGE | INTRAMUSCULAR | Status: AC
  Administered 2014-06-25: 0.5 mL via INTRAMUSCULAR

## 2014-06-25 MED ORDER — PNEUMOCOCCAL VAC POLYVALENT 25 MCG/0.5ML IJ INJ
0.5000 mL | INJECTION | INTRAMUSCULAR | Status: AC
  Administered 2014-06-25: 0.5 mL via INTRAMUSCULAR
  Filled 2014-06-25: qty 0.5

## 2014-06-25 MED ORDER — FENTANYL CITRATE 0.05 MG/ML IJ SOLN
INTRAMUSCULAR | Status: DC | PRN
  Administered 2014-06-25: 25 ug via INTRAVENOUS
  Administered 2014-06-25: 50 ug via INTRAVENOUS
  Administered 2014-06-25: 25 ug via INTRAVENOUS
  Administered 2014-06-25: 100 ug via INTRAVENOUS
  Administered 2014-06-25 (×6): 50 ug via INTRAVENOUS

## 2014-06-25 MED ORDER — HYDROMORPHONE HCL 1 MG/ML IJ SOLN
INTRAMUSCULAR | Status: DC | PRN
  Administered 2014-06-25 (×2): 0.5 mg via INTRAVENOUS

## 2014-06-25 MED ORDER — MIDAZOLAM HCL 2 MG/2ML IJ SOLN
INTRAMUSCULAR | Status: AC
  Filled 2014-06-25: qty 2

## 2014-06-25 MED ORDER — HYDROMORPHONE HCL 2 MG PO TABS: 2.0000 mg | ORAL_TABLET | ORAL | Status: DC | PRN

## 2014-06-25 MED ORDER — DEXAMETHASONE SODIUM PHOSPHATE 10 MG/ML IJ SOLN
INTRAMUSCULAR | Status: DC | PRN
  Administered 2014-06-25: 4 mg via INTRAVENOUS

## 2014-06-25 MED ORDER — HYDROMORPHONE HCL 1 MG/ML IJ SOLN
INTRAMUSCULAR | Status: AC
  Filled 2014-06-25: qty 1

## 2014-06-25 MED ORDER — IBUPROFEN 600 MG PO TABS: 600.0000 mg | ORAL_TABLET | Freq: Four times a day (QID) | ORAL | Status: DC | PRN

## 2014-06-25 MED ORDER — HYDROMORPHONE HCL 2 MG PO TABS
2.0000 mg | ORAL_TABLET | ORAL | Status: DC | PRN
Start: 2014-06-25 — End: 2014-06-25
  Administered 2014-06-25 (×3): 2 mg via ORAL
  Filled 2014-06-25 (×3): qty 1

## 2014-06-25 MED ORDER — VASOPRESSIN 20 UNIT/ML IV SOLN
INTRAVENOUS | Status: AC
  Filled 2014-06-25: qty 1

## 2014-06-25 MED ORDER — DEXTROSE-NACL 5-0.45 % IV SOLN: INTRAVENOUS | Status: DC

## 2014-06-25 MED ORDER — GLYCOPYRROLATE 0.2 MG/ML IJ SOLN
INTRAMUSCULAR | Status: DC | PRN
  Administered 2014-06-25: 0.6 mg via INTRAVENOUS

## 2014-06-25 MED ORDER — SODIUM CHLORIDE 0.9 % IJ SOLN
INTRAMUSCULAR | Status: AC
  Filled 2014-06-25: qty 50

## 2014-06-25 MED ORDER — BUPIVACAINE HCL (PF) 0.25 % IJ SOLN
INTRAMUSCULAR | Status: DC | PRN
  Administered 2014-06-25: 9 mL

## 2014-06-25 MED ORDER — FENTANYL CITRATE 0.05 MG/ML IJ SOLN
INTRAMUSCULAR | Status: AC
  Administered 2014-06-25: 50 ug via INTRAVENOUS
  Filled 2014-06-25: qty 2

## 2014-06-25 MED ORDER — ALBUTEROL SULFATE HFA 108 (90 BASE) MCG/ACT IN AERS
INHALATION_SPRAY | RESPIRATORY_TRACT | Status: AC
  Filled 2014-06-25: qty 6.7

## 2014-06-25 MED ORDER — ONDANSETRON HCL 4 MG/2ML IJ SOLN: 4.0000 mg | Freq: Four times a day (QID) | INTRAMUSCULAR | Status: DC | PRN

## 2014-06-25 MED ORDER — CEFAZOLIN SODIUM-DEXTROSE 2-3 GM-% IV SOLR
INTRAVENOUS | Status: AC
  Filled 2014-06-25: qty 50

## 2014-06-25 MED ORDER — LIDOCAINE HCL (CARDIAC) 20 MG/ML IV SOLN
INTRAVENOUS | Status: DC | PRN
  Administered 2014-06-25: 70 mg via INTRAVENOUS
  Administered 2014-06-25: 20 mg via INTRAVENOUS

## 2014-06-25 MED ORDER — ALUM & MAG HYDROXIDE-SIMETH 200-200-20 MG/5ML PO SUSP: 30.0000 mL | ORAL | Status: DC | PRN

## 2014-06-25 SURGICAL SUPPLY — 39 items
CANISTER SUCT 3000ML (MISCELLANEOUS) ×4 IMPLANT
CATH ROBINSON RED A/P 16FR (CATHETERS) ×4 IMPLANT
CLOTH BEACON ORANGE TIMEOUT ST (SAFETY) ×4 IMPLANT
CONT PATH 16OZ SNAP LID 3702 (MISCELLANEOUS) ×4 IMPLANT
COVER BACK TABLE 60X90IN (DRAPES) ×4 IMPLANT
DECANTER SPIKE VIAL GLASS SM (MISCELLANEOUS) ×12 IMPLANT
DRAPE HYSTEROSCOPY (DRAPE) ×4 IMPLANT
DRSG COVADERM PLUS 2X2 (GAUZE/BANDAGES/DRESSINGS) ×8 IMPLANT
DRSG OPSITE POSTOP 3X4 (GAUZE/BANDAGES/DRESSINGS) ×4 IMPLANT
DURAPREP 26ML APPLICATOR (WOUND CARE) ×4 IMPLANT
ELECT REM PT RETURN 9FT ADLT (ELECTROSURGICAL) ×4
ELECTRODE REM PT RTRN 9FT ADLT (ELECTROSURGICAL) IMPLANT
GLOVE BIO SURGEON STRL SZ8 (GLOVE) ×4 IMPLANT
GLOVE BIOGEL PI IND STRL 6.5 (GLOVE) ×2 IMPLANT
GLOVE BIOGEL PI IND STRL 7.0 (GLOVE) ×4 IMPLANT
GLOVE BIOGEL PI INDICATOR 6.5 (GLOVE) ×2
GLOVE BIOGEL PI INDICATOR 7.0 (GLOVE) ×4
GLOVE ORTHO TXT STRL SZ7.5 (GLOVE) ×8 IMPLANT
GOWN STRL REUS W/TWL LRG LVL3 (GOWN DISPOSABLE) ×8 IMPLANT
LIQUID BAND (GAUZE/BANDAGES/DRESSINGS) ×4 IMPLANT
NEEDLE INSUFFLATION 120MM (ENDOMECHANICALS) ×4 IMPLANT
NS IRRIG 1000ML POUR BTL (IV SOLUTION) ×4 IMPLANT
PACK LAVH (CUSTOM PROCEDURE TRAY) ×4 IMPLANT
PACK ROBOTIC GOWN (GOWN DISPOSABLE) ×4 IMPLANT
PAD POSITIONER PINK NONSTERILE (MISCELLANEOUS) ×4 IMPLANT
PROTECTOR NERVE ULNAR (MISCELLANEOUS) ×8 IMPLANT
SET CYSTO W/LG BORE CLAMP LF (SET/KITS/TRAYS/PACK) ×4 IMPLANT
SET IRRIG TUBING LAPAROSCOPIC (IRRIGATION / IRRIGATOR) ×2 IMPLANT
SHEARS HARMONIC ACE PLUS 36CM (ENDOMECHANICALS) ×4 IMPLANT
SUT CHROMIC 1MO 4 18 CR8 (SUTURE) ×8 IMPLANT
SUT CHROMIC GUT AB #0 18 (SUTURE) ×4 IMPLANT
SUT SILK 2 0 SH (SUTURE) ×4 IMPLANT
SUT VIC AB 2-0 CT1 (SUTURE) ×4 IMPLANT
SUT VICRYL 4-0 PS2 18IN ABS (SUTURE) ×4 IMPLANT
TOWEL OR 17X24 6PK STRL BLUE (TOWEL DISPOSABLE) ×8 IMPLANT
TRAY FOLEY CATH 14FR (SET/KITS/TRAYS/PACK) ×4 IMPLANT
TROCAR XCEL NON-BLD 5MMX100MML (ENDOMECHANICALS) ×12 IMPLANT
WARMER LAPAROSCOPE (MISCELLANEOUS) ×4 IMPLANT
WATER STERILE IRR 1000ML POUR (IV SOLUTION) ×4 IMPLANT

## 2014-06-25 NOTE — Op Note (Signed)
Preoperative diagnosis: Menorrhagia, dysmenorrhea, dyspareunia, pelvic pain Postoperative diagnosis:  Same  Procedure: Laparoscopic-assisted vaginal hysterectomy, bilateral salpingectomy, cystoscopy Surgeon: Cheri Fowler M.D. Assistant: Janyth Contes, MD Anesthesia: Gen. Endotracheal tube Findings: She had a normal upper abdomen, normal pelvis, normal uterus tubes and ovaries.  Possible small spots of clear endometriosis left ovarian fossa.  Normal bladder and patent ureters by cystoscopy. Estimated blood loss: 200 cc Specimens: Uterus with tubes sent for routine pathology Complications: None  Procedure in detail: The patient was taken to the operating room and placed in the dorsosupine position. General anesthesia was induced and legs were placed in mobile stirrups and arms tucked to her sides. Abdomen and perineum were then prepped and draped in usual sterile fashion, bladder drained with a red Robinson catheter, Hulka tenaculum applied to the cervix for uterine manipulation. Infraumbilical skin was then infiltrated with quarter percent Marcaine and a 1 cm vertical incision was made. Veress needle was inserted into the peritoneal cavity and placement confirmed by the water drop test an opening pressure of 6 mm mercury. CO2 was insufflated to a pressure of 13 mm mercury and the Veress needle was removed. A 5 mm trocar was introduced with direct visualization. A 5 mm port was then placed on the each side also under direct visualization. Inspection revealed the above-mentioned findings. The distal right  Tube was grasped with a 5 mm tenaculum. The Harmonic Scalpel ACE was then used to take down the mesosalpinx, the right round ligament, uteroovarian ligament, right broad ligament and incised the anterior peritoneum across the anterior lower part of the uterus. Bleeding from the right uterine artery was controlled with the Harmonic scalpel.  A similar procedure was then performed on the left side  taking down the utero-ovarian pedicle, round ligament, broad ligament. Anterior peritoneum was incised across the anterior part of the uterus to meet the incision coming from the right side. At this point the uterus was fairly free and there is adequate hemostasis to proceed vaginally.  The legs were elevated in stirrups. A weighted speculum was inserted in the vagina. The cervix was grasped with a Ardis Hughs tenaculum. A dilute solution of Pitressin and Marcaine was infiltrated around the cervicovaginal junction which was then incised circumferentially with electrocautery. Sharp dissection was then used to further free the vagina from the cervix. Anterior peritoneum was identified and entered sharply. A Deaver retractor was then to retract the bladder anterior. Posterior cul-de-sac was identified and entered sharply. A Bonnano speculum was placed into the posterior cul-de-sac. Uterosacral ligaments were clamped transected and ligated with #1 chromic and tagged for later use. Cardinal ligaments, uterine arteries and the  remaining pedicles were taken down with the Harmonic scalpel.  Bleeding from the left uterine artery was controlled with one figure 8 suture of #1 Chromic. The uterosacral ligaments were plicated in the midline with 2-0 silk and the previously tagged uterosacral pedicles were also tied in the midline. The vaginal cuff was then closed in a vertical fashion with running locking 2-0 Vicryl with adequate closure and adequate hemostasis.  Cystoscopy was then performed which revealed normal bladder and patent ureters.  Bladder was drained again with a Red Robinson catheter.  Attention was turned back to the abdomen. Dr. Sandford Craze and the scrub tech and myself all changed gowns and gloves. The abdomen was reinsufflated. Laparoscope was reinserted and good visualization was achieved. The pelvis was irrigated and clots removed, no active bleeding identified. Both ureters were identified and found to be  below incision lines.  The 5 mm ports on each side were removed under direct visualization. The laparoscope was then removed and all gas was allowed to deflate from the abdomen. The umbilical trocar was then removed. Skin incisions were closed with interrupted subcuticular sutures of 4-0 Vicryl followed by Dermabond. The patient was awakened in the operating room and taken to the recovery room in stable condition after tolerating the procedure well. Counts were correct x2, she received Ancef 2 g IV the beginning of the procedure, she had PAS hose on throughout the procedure.

## 2014-06-25 NOTE — Anesthesia Postprocedure Evaluation (Signed)
  Anesthesia Post-op Note  Patient: [name]  Procedure(s) Performed: Procedure(s): LAPAROSCOPIC ASSISTED VAGINAL HYSTERECTOMY (N/A) BILATERAL SALPINGECTOMY (Bilateral) CYSTOSCOPY (N/A)  Patient Location: [facility] Unit  Anesthesia Type:General  Level of Consciousness: awake, alert  and oriented  Airway and Oxygen Therapy: Patient Spontanous Breathing  Post-op Pain: mild  Post-op Assessment: Post-op Vital signs reviewed and Patient's Cardiovascular Status Stable  Post-op Vital Signs: Reviewed and stable  Last Vitals:  Filed Vitals:   06/25/14 1308  BP: 107/55  Pulse: 70  Temp:   Resp: 16    Complications: No apparent anesthesia complications

## 2014-06-25 NOTE — Anesthesia Procedure Notes (Signed)
Procedure Name: Intubation Date/Time: 06/25/2014 9:08 AM Performed by: Tobin Chad Pre-anesthesia Checklist: Patient identified, Timeout performed, Emergency Drugs available, Suction available and Patient being monitored Patient Re-evaluated:Patient Re-evaluated prior to inductionOxygen Delivery Method: Simple face mask and Circle system utilized Preoxygenation: Pre-oxygenation with 100% oxygen Intubation Type: IV induction and Inhalational induction Ventilation: Mask ventilation without difficulty Laryngoscope Size: Mac and 3 Grade View: Grade II Tube type: Oral Tube size: 7.0 mm Number of attempts: 1 Dental Injury: Teeth and Oropharynx as per pre-operative assessment

## 2014-06-25 NOTE — Discharge Instructions (Signed)
Routine instructions for hysterectomy °

## 2014-06-25 NOTE — Interval H&P Note (Signed)
History and Physical Interval Note:  06/25/2014 8:35 AM  [name]  has presented today for surgery, with the diagnosis of Dysmenorrhea/Menorrhagia  The various methods of treatment have been discussed with the patient and family. After consideration of risks, benefits and other options for treatment, the patient has consented to  Procedure(s): LAPAROSCOPIC ASSISTED VAGINAL HYSTERECTOMY (N/A) BILATERAL SALPINGECTOMY (Bilateral) CYSTOSCOPY (N/A) as a surgical intervention .  The patient's history has been reviewed, patient examined, no change in status, stable for surgery.  I have reviewed the patient's chart and labs.  Questions were answered to the patient's satisfaction.     Nehal Shives D

## 2014-06-25 NOTE — Transfer of Care (Signed)
Immediate Anesthesia Transfer of Care Note  Patient: Myan Locatelli Bullis  Procedure(s) Performed: Procedure(s): LAPAROSCOPIC ASSISTED VAGINAL HYSTERECTOMY (N/A) BILATERAL SALPINGECTOMY (Bilateral) CYSTOSCOPY (N/A)  Patient Location: PACU  Anesthesia Type:General  Level of Consciousness: awake, alert , oriented and patient cooperative  Airway & Oxygen Therapy: Patient Spontanous Breathing and Patient connected to nasal cannula oxygen  Post-op Assessment: Report given to RN and Post -op Vital signs reviewed and stable  Post vital signs: Reviewed and stable  Last Vitals:  Filed Vitals:   06/25/14 0800  BP: 132/74  Pulse: 77  Temp: 36.3 C  Resp: 18    Complications: No apparent anesthesia complications

## 2014-06-25 NOTE — Progress Notes (Signed)
Post-op check Pain is ok, no n/v, voiding, walking.  Upset with social situation.  Wants to go home Afeb, VSS Abd- soft, incisions intact Will d/c home

## 2014-06-25 NOTE — Discharge Summary (Signed)
Physician Discharge Summary  Patient ID: [name] MRN: [id number] DOB/AGE: 07/11/1985 29 y.o.  Admit date: 06/25/2014 Discharge date: 06/25/2014  Admission Diagnoses:  Menorrhagia, dysmenorrhea, chronic pelvic pain  Discharge Diagnoses:  Same Active Problems:   S/P hysterectomy   Discharged Condition: good  Hospital Course: Underwent LAVH, bilateral salpingectomy and cystoscopy with 200 cc EBL.  Initially had some difficulty with pain control, but this improved and she was motivated to go home evening of surgery.   Discharge Exam: Blood pressure 107/55, pulse 70, temperature 98.2 F (36.8 C), temperature source Oral, resp. rate 16, height 5\' 2"  (1.575 m), weight 92.987 kg (205 lb), last menstrual period 06/15/2014, SpO2 98 %. General appearance: alert  Disposition: 01-Home or Self Care  Discharge Instructions    Diet - low sodium heart healthy    Complete by:  As directed      Increase activity slowly    Complete by:  As directed      Lifting restrictions    Complete by:  As directed   10 lbs     Sexual Activity Restrictions    Complete by:  As directed   Pelvic rest            Medication List    STOP taking these medications        acetaminophen-codeine 300-30 MG per tablet  Commonly known as:  TYLENOL #3     butorphanol 10 MG/ML nasal spray  Commonly known as:  STADOL      TAKE these medications        buPROPion 100 MG tablet  Commonly known as:  WELLBUTRIN  Take 100 mg by mouth 2 (two) times daily.     HYDROmorphone 2 MG tablet  Commonly known as:  DILAUDID  Take 1 tablet (2 mg total) by mouth every 3 (three) hours as needed for severe pain.     ibuprofen 600 MG tablet  Commonly known as:  ADVIL,MOTRIN  Take 1 tablet (600 mg total) by mouth every 6 (six) hours as needed (pain).     PRESCRIPTION MEDICATION  Place 1 suppository vaginally daily as needed (pelvic pain). Valium Suppository, Compounded at pharmacy           Follow-up  Information    Follow up with Lyndell Allaire D, MD. Schedule an appointment as soon as possible for a visit in 2 weeks.   Specialty:  Obstetrics and Gynecology   Contact information:   [address] [place] [address] [phone]       Signed: Clarene Duke 06/25/2014, 5:22 PM

## 2014-06-25 NOTE — Anesthesia Postprocedure Evaluation (Signed)
  Anesthesia Post-op Note  Patient: [name]  Procedure(s) Performed: Procedure(s) (LRB): LAPAROSCOPIC ASSISTED VAGINAL HYSTERECTOMY (N/A) BILATERAL SALPINGECTOMY (Bilateral) CYSTOSCOPY (N/A)  Patient Location: PACU  Anesthesia Type: General  Level of Consciousness: awake and alert   Airway and Oxygen Therapy: Patient Spontanous Breathing  Post-op Pain: mild  Post-op Assessment: Post-op Vital signs reviewed, Patient's Cardiovascular Status Stable, Respiratory Function Stable, Patent Airway and No signs of Nausea or vomiting  Last Vitals:  Filed Vitals:   06/25/14 0800  BP: 132/74  Pulse: 77  Temp: 36.3 C  Resp: 18    Post-op Vital Signs: stable   Complications: No apparent anesthesia complications

## 2014-06-25 NOTE — Progress Notes (Signed)
Pt verbalizes understanding of d/c instructions , medications, follow up appts, when to seek medical attn and belongings policy. IV was d/c without complications but did bleed, dressing was reinforced prior to d/c. Pt has no questions at this time. Pt was given a copy of d/c instructions and "Recovering from Surgery" book, prescription was sent with SO who took to pharmacy to have it filled. Pt was escorted to the main entrance by NT. Pts SO and friend are with her at this time. [name]

## 2014-06-25 NOTE — Addendum Note (Signed)
Addendum  created 06/25/14 1431 by Georgeanne Nim, CRNA   Modules edited: Notes Section   Notes Section:  File: [id number]

## 2014-06-25 NOTE — Plan of Care (Signed)
Problem: Phase II Progression Outcomes Goal: Foley discontinued Outcome: Not Applicable Date Met:  87/06/58 Removed prior to coming to this dept.

## 2014-06-26 ENCOUNTER — Encounter (HOSPITAL_COMMUNITY): Payer: Self-pay | Admitting: Obstetrics and Gynecology

## 2014-07-31 ENCOUNTER — Emergency Department (HOSPITAL_COMMUNITY)
Admission: EM | Admit: 2014-07-31 | Discharge: 2014-07-31 | Disposition: A | Discharge status: Home / Self Care | Payer: Medicaid Other | Attending: Emergency Medicine | Admitting: Emergency Medicine

## 2014-07-31 ENCOUNTER — Emergency Department (HOSPITAL_COMMUNITY): Payer: Medicaid Other

## 2014-07-31 ENCOUNTER — Encounter (HOSPITAL_COMMUNITY): Payer: Self-pay

## 2014-07-31 DIAGNOSIS — R1012 Left upper quadrant pain: Secondary | ICD-10-CM | POA: Insufficient documentation

## 2014-07-31 DIAGNOSIS — Z9079 Acquired absence of other genital organ(s): Secondary | ICD-10-CM | POA: Diagnosis not present

## 2014-07-31 DIAGNOSIS — Z9851 Tubal ligation status: Secondary | ICD-10-CM | POA: Diagnosis not present

## 2014-07-31 DIAGNOSIS — J45909 Unspecified asthma, uncomplicated: Secondary | ICD-10-CM | POA: Insufficient documentation

## 2014-07-31 DIAGNOSIS — Z87891 Personal history of nicotine dependence: Secondary | ICD-10-CM | POA: Insufficient documentation

## 2014-07-31 DIAGNOSIS — R1032 Left lower quadrant pain: Secondary | ICD-10-CM | POA: Insufficient documentation

## 2014-07-31 DIAGNOSIS — F419 Anxiety disorder, unspecified: Secondary | ICD-10-CM | POA: Insufficient documentation

## 2014-07-31 DIAGNOSIS — R1030 Lower abdominal pain, unspecified: Secondary | ICD-10-CM

## 2014-07-31 DIAGNOSIS — Z87442 Personal history of urinary calculi: Secondary | ICD-10-CM | POA: Diagnosis not present

## 2014-07-31 DIAGNOSIS — Z8619 Personal history of other infectious and parasitic diseases: Secondary | ICD-10-CM | POA: Diagnosis not present

## 2014-07-31 DIAGNOSIS — Z79899 Other long term (current) drug therapy: Secondary | ICD-10-CM | POA: Insufficient documentation

## 2014-07-31 DIAGNOSIS — Z8742 Personal history of other diseases of the female genital tract: Secondary | ICD-10-CM | POA: Diagnosis not present

## 2014-07-31 DIAGNOSIS — F329 Major depressive disorder, single episode, unspecified: Secondary | ICD-10-CM | POA: Diagnosis not present

## 2014-07-31 DIAGNOSIS — Z90710 Acquired absence of both cervix and uterus: Secondary | ICD-10-CM | POA: Insufficient documentation

## 2014-07-31 DIAGNOSIS — Z880 Allergy status to penicillin: Secondary | ICD-10-CM | POA: Diagnosis not present

## 2014-07-31 LAB — CBC WITH DIFFERENTIAL/PLATELET
Basophils Absolute: 0 10*3/uL (ref 0.0–0.1)
Basophils Relative: 1 % (ref 0–1)
Eosinophils Absolute: 0.2 10*3/uL (ref 0.0–0.7)
Eosinophils Relative: 4 % (ref 0–5)
HCT: 36.8 % (ref 36.0–46.0)
Hemoglobin: 12.2 g/dL (ref 12.0–15.0)
Lymphocytes Relative: 44 % (ref 12–46)
Lymphs Abs: 2.1 10*3/uL (ref 0.7–4.0)
MCH: 33.4 pg (ref 26.0–34.0)
MCHC: 33.2 g/dL (ref 30.0–36.0)
MCV: 100.8 fL — ABNORMAL HIGH (ref 78.0–100.0)
Monocytes Absolute: 0.4 10*3/uL (ref 0.1–1.0)
Monocytes Relative: 8 % (ref 3–12)
Neutro Abs: 2.1 10*3/uL (ref 1.7–7.7)
Neutrophils Relative %: 43 % (ref 43–77)
Platelets: 243 10*3/uL (ref 150–400)
RBC: 3.65 MIL/uL — ABNORMAL LOW (ref 3.87–5.11)
RDW: 13.9 % (ref 11.5–15.5)
WBC: 4.7 10*3/uL (ref 4.0–10.5)

## 2014-07-31 LAB — COMPREHENSIVE METABOLIC PANEL
ALT: 230 U/L — ABNORMAL HIGH (ref 0–35)
AST: 160 U/L — ABNORMAL HIGH (ref 0–37)
Albumin: 3.1 g/dL — ABNORMAL LOW (ref 3.5–5.2)
Alkaline Phosphatase: 57 U/L (ref 39–117)
Anion gap: 6 (ref 5–15)
BUN: 9 mg/dL (ref 6–23)
CO2: 26 mmol/L (ref 19–32)
Calcium: 8.4 mg/dL (ref 8.4–10.5)
Chloride: 107 mmol/L (ref 96–112)
Creatinine, Ser: 0.67 mg/dL (ref 0.50–1.10)
GFR calc Af Amer: >90 mL/min (ref >90)
GFR calc non Af Amer: >90 mL/min (ref >90)
Glucose, Bld: 94 mg/dL (ref 70–99)
Potassium: 4.1 mmol/L (ref 3.5–5.1)
Sodium: 139 mmol/L (ref 135–145)
Total Bilirubin: 0.3 mg/dL (ref 0.3–1.2)
Total Protein: 5.4 g/dL — ABNORMAL LOW (ref 6.0–8.3)

## 2014-07-31 LAB — URINALYSIS, ROUTINE W REFLEX MICROSCOPIC
Bilirubin Urine: NEGATIVE
Glucose, UA: NEGATIVE mg/dL
Ketones, ur: NEGATIVE mg/dL
Nitrite: NEGATIVE
Protein, ur: NEGATIVE mg/dL
Specific Gravity, Urine: 1.019 (ref 1.005–1.030)
Urobilinogen, UA: 0.2 mg/dL (ref 0.0–1.0)
pH: 5 (ref 5.0–8.0)

## 2014-07-31 LAB — URINE MICROSCOPIC-ADD ON

## 2014-07-31 LAB — LIPASE, BLOOD: Lipase: 19 U/L (ref 11–59)

## 2014-07-31 MED ORDER — SODIUM CHLORIDE 0.9 % IV BOLUS (SEPSIS)
1000.0000 mL | Freq: Once | INTRAVENOUS | Status: AC
  Administered 2014-07-31: 1000 mL via INTRAVENOUS

## 2014-07-31 MED ORDER — HYDROMORPHONE HCL 2 MG PO TABS: 1.0000 mg | ORAL_TABLET | Freq: Four times a day (QID) | ORAL | Status: DC | PRN

## 2014-07-31 MED ORDER — ONDANSETRON HCL 4 MG/2ML IJ SOLN
4.0000 mg | Freq: Once | INTRAMUSCULAR | Status: AC
  Administered 2014-07-31: 4 mg via INTRAVENOUS
  Filled 2014-07-31: qty 2

## 2014-07-31 MED ORDER — ONDANSETRON 4 MG PO TBDP
8.0000 mg | ORAL_TABLET | Freq: Once | ORAL | Status: AC
  Administered 2014-07-31: 8 mg via ORAL
  Filled 2014-07-31: qty 2

## 2014-07-31 MED ORDER — CEPHALEXIN 500 MG PO CAPS
500.0000 mg | ORAL_CAPSULE | Freq: Two times a day (BID) | ORAL | Status: AC
Start: 2014-07-31 — End: 2014-08-07

## 2014-07-31 MED ORDER — HYDROMORPHONE HCL 1 MG/ML IJ SOLN
0.5000 mg | Freq: Once | INTRAMUSCULAR | Status: AC
  Administered 2014-07-31: 0.5 mg via INTRAVENOUS
  Filled 2014-07-31: qty 1

## 2014-07-31 MED ORDER — ONDANSETRON 8 MG PO TBDP: 8.0000 mg | ORAL_TABLET | Freq: Once | ORAL | Status: DC

## 2014-07-31 NOTE — ED Provider Notes (Signed)
CSN: [id number]     Arrival date & time 07/31/14  0630 History   First MD Initiated Contact with Patient 07/31/14 [phone]     Chief Complaint  Patient presents with  . Flank Pain    HPI  Patient presents with ongoing abdominal pain, nausea, vomiting, fever. Symptoms began about one week ago, initially with suprapubic discomfort. About that time the patient saw her urologist. [place] was performed, demonstrating possible left renal stone. Since that time the patient has completed course of Bactrim but has increasing nausea, more frequent vomiting, and yesterday had fever of 102. Minimal relief with home narcotic use. No right-sided abdominal pain, no diarrhea, no hematuria However, there is a notable amount of discomfort with urination, and when not urinating in the lower abdomen.   Past Medical History  Diagnosis Date  . Endometriosis   . History of pelvic inflammatory disease   . History of genital warts   . Pelvic pain in female   . Mood disorder     UNSPECIFIED  . History of asthma     with bronchitis  . SVD (spontaneous vaginal delivery)   . Depression   . Anxiety   . History of kidney stones     passed stones, no surgery required   Past Surgical History  Procedure Laterality Date  . Ablation colpoclesis    . Laparoscopy N/A 08/18/2013    Procedure: LAPAROSCOPY DIAGNOSTIC, FULGERATION OF ENDOMETROSIS;  Surgeon: Cheri Fowler, MD;  Location: [facility];  Service: Gynecology;  Laterality: N/A;  . Dx laparoscopy/ aspiration right ovarian cyst and bx posterior cul-de-sac  05-27-2005  . Essure tubal ligation Bilateral 2011  . Cysto with hydrodistension N/A 12/01/2013    Procedure: CYSTOSCOPY/HYDRODISTENSION with instillation of marcaine and pyridium;  Surgeon: Ardis Hughs, MD;  Location: [facility];  Service: Urology;  Laterality: N/A;  . Wisdom tooth extraction    . Laparoscopic assisted vaginal hysterectomy N/A 06/25/2014    Procedure: LAPAROSCOPIC ASSISTED  VAGINAL HYSTERECTOMY;  Surgeon: Cheri Fowler, MD;  Location: [facility];  Service: Gynecology;  Laterality: N/A;  . Bilateral salpingectomy Bilateral 06/25/2014    Procedure: BILATERAL SALPINGECTOMY;  Surgeon: Cheri Fowler, MD;  Location: [facility];  Service: Gynecology;  Laterality: Bilateral;  . Cystoscopy N/A 06/25/2014    Procedure: CYSTOSCOPY;  Surgeon: Cheri Fowler, MD;  Location: [facility];  Service: Gynecology;  Laterality: N/A;   No family history on file. History  Substance Use Topics  . Smoking status: Former Smoker -- 0.50 packs/day for 4 years    Types: Cigarettes    Quit date: 11/27/2009  . Smokeless tobacco: Never Used  . Alcohol Use: No   OB History    Gravida Para Term Preterm AB TAB SAB Ectopic Multiple Living   2 2 2       2      Review of Systems  Constitutional:       Per HPI, otherwise negative  HENT:       Per HPI, otherwise negative  Respiratory:       Per HPI, otherwise negative  Cardiovascular:       Per HPI, otherwise negative  Gastrointestinal: Negative for vomiting.  Endocrine:       Negative aside from HPI  Genitourinary:       Neg aside from HPI   Musculoskeletal:       Per HPI, otherwise negative  Skin: Negative.   Neurological: Negative for syncope.      Allergies  Biaxin; Penicillins; and  Toradol  Home Medications   Prior to Admission medications   Medication Sig Start Date End Date Taking? Authorizing Provider  buPROPion (WELLBUTRIN) 100 MG tablet Take 100 mg by mouth 2 (two) times daily.   Yes Historical Provider, MD  ibuprofen (ADVIL,MOTRIN) 600 MG tablet Take 1 tablet (600 mg total) by mouth every 6 (six) hours as needed (pain). 06/25/14  Yes Cheri Fowler, MD  PRESCRIPTION MEDICATION Place 1 suppository vaginally daily as needed (pelvic pain). Valium Suppository, Compounded at pharmacy   Yes Historical Provider, MD  HYDROmorphone (DILAUDID) 2 MG tablet Take 1 tablet (2 mg total) by mouth every 3 (three) hours as needed for severe  pain. Patient not taking: Reported on 07/31/2014 06/25/14   Cheri Fowler, MD   BP 118/73 mmHg  Pulse 88  Temp(Src) 97.8 F (36.6 C) (Oral)  Resp 18  Ht 5\' 2"  (1.575 m)  Wt 175 lb (79.379 kg)  BMI 32.00 kg/m2  SpO2 100%  LMP  Physical Exam  Constitutional: She is oriented to person, place, and time. She appears well-developed and well-nourished. No distress.  HENT:  Head: Normocephalic and atraumatic.  Eyes: Conjunctivae and EOM are normal.  Cardiovascular: Normal rate and regular rhythm.   Pulmonary/Chest: Effort normal and breath sounds normal. No stridor. No respiratory distress.  Abdominal: She exhibits no distension.    Musculoskeletal: She exhibits no edema.  Neurological: She is alert and oriented to person, place, and time. No cranial nerve deficit.  Skin: Skin is warm and dry.  Psychiatric: She has a normal mood and affect.  Nursing note and vitals reviewed.   ED Course  Procedures (including critical care time) Labs Review Labs Reviewed  CBC WITH DIFFERENTIAL/PLATELET - Abnormal; Notable for the following:    RBC 3.65 (*)    MCV 100.8 (*)    All other components within normal limits  COMPREHENSIVE METABOLIC PANEL - Abnormal; Notable for the following:    Total Protein 5.4 (*)    Albumin 3.1 (*)    AST 160 (*)    ALT 230 (*)    All other components within normal limits  URINALYSIS, ROUTINE W REFLEX MICROSCOPIC - Abnormal; Notable for the following:    APPearance CLOUDY (*)    Hgb urine dipstick TRACE (*)    Leukocytes, UA LARGE (*)    All other components within normal limits  URINE MICROSCOPIC-ADD ON - Abnormal; Notable for the following:    Squamous Epithelial / LPF MANY (*)    Bacteria, UA MANY (*)    All other components within normal limits  URINE CULTURE  LIPASE, BLOOD    Imaging Review US Renal  07/31/2014   CLINICAL DATA:  LEFT flank pain, nausea, vomiting, history of kidney stones and multiple bladder infections  EXAM: RENAL/URINARY TRACT  ULTRASOUND COMPLETE  COMPARISON:  Ultrasound abdomen 06/20/2007  FINDINGS: Right Kidney:  Length: 12.6 cm. Normal cortical thickness and echogenicity. No mass, hydronephrosis or shadowing calcification.  Left Kidney:  Length: 12.4 cm. Normal cortical thickness and echogenicity. No mass, hydronephrosis or shadowing calcification.  Bladder:  Only partially distended, grossly unremarkable.  IMPRESSION: Normal renal ultrasound.   Electronically Signed   By: Lavonia Dana M.D.   On: 07/31/2014 08:40   Ct Renal Stone Study  07/31/2014   CLINICAL DATA:  29 year old female with left flank pain radiating to the back. Recent clinical history of UTI and bladder pain.  EXAM: CT ABDOMEN AND PELVIS WITHOUT CONTRAST  TECHNIQUE: Multidetector CT imaging of the abdomen and  pelvis was performed following the standard protocol without IV contrast.  COMPARISON:  Abdominal ultrasound 07/31/2014 ; prior CT abdomen/ pelvis 04/27/2005  FINDINGS: Lower Chest: The lung bases are clear. Visualized cardiac structures are within normal limits for size. No pericardial effusion. Unremarkable visualized distal thoracic esophagus.  Abdomen: Unenhanced CT was performed per clinician order. Lack of IV contrast limits sensitivity and specificity, especially for evaluation of abdominal/pelvic solid viscera. Within these limitations, unremarkable CT appearance of the stomach, duodenum, spleen, adrenal glands and pancreas. Normal hepatic contour and morphology. No discrete hepatic lesion. Gallbladder is unremarkable. No intra or extrahepatic biliary ductal dilatation.  Unremarkable appearance of the bilateral kidneys. No focal solid lesion, hydronephrosis or nephrolithiasis.  No evidence of obstruction or focal bowel wall thickening. Normal appendix in the right lower quadrant. The terminal ileum is unremarkable. No free fluid or suspicious adenopathy.  Pelvis: Surgical changes of hysterectomy. Both ovaries are unremarkable. The bladder is unremarkable.   Bones/Soft Tissues: No acute fracture or aggressive appearing lytic or blastic osseous lesion.  Vascular: Limited evaluation in the absence of intravenous contrast. No aneurysmal dilatation or calcified atherosclerotic plaque.  IMPRESSION: 1. Unremarkable CT scan of the abdomen and pelvis. Specifically, no evidence of nephro or ureterolithiasis. 2. Surgical changes of prior hysterectomy without complicating feature. The bilateral ovaries are unremarkable.   Electronically Signed   By: Jacqulynn Cadet M.D.   On: 07/31/2014 09:38    Chart review demonstrates no recent CT scan, ultrasound. Patient has hysterectomy.  Update: Patient continues to have left-sided pain. Ultrasound was not revealing. CT scan ordered.  9:56 AM I reviewed the results (including imaging as performed), agree with the interpretation  On repeat exam the patient appears better.  We reviewed all findings including elevated transaminase.   MDM   Final diagnoses:  Lower abdominal pain   this young female with prior hysterectomy, known pelvic floor dysfunction presents with new abdominal pain. Patient also has a history of recurrent urinary tract infection/cystitis, and kidney stone. Here, there is no evidence for sepsis or bacteremia, though there is suspicion for ongoing urinary tract infection versus cystitis. No evidence for kidney stone. Low suspicion for occult acute abdomen. Patient is mildly elevated transaminase, but no right upper quadrant pain, no persistent nausea, vomiting. Patient has previously scheduled follow-up, was discharged with analgesics, antibiotics, antibiotics pending urine culture results.    Carmin Muskrat, MD 07/31/14 [phone]

## 2014-07-31 NOTE — ED Notes (Signed)
Pt states went to urologist last week, had xray and was told had possible stone, continues to have pain and n/v, called today and unable to been seen again until next week, pt states she is also on ABT for bladder infection which she feels is worse.

## 2014-07-31 NOTE — Discharge Instructions (Signed)
As discussed, your evaluation today has been largely reassuring.  But, it is important that you monitor your condition carefully, and do not hesitate to return to the ED if you develop new, or concerning changes in your condition.  Please be sure to discuss your mildly elevated liver enzymes with your primary care physician.  Please be sure to keep your appointment with your urologist next week.

## 2014-08-01 LAB — URINE CULTURE: Colony Count: 15000

## 2014-12-10 ENCOUNTER — Encounter (HOSPITAL_COMMUNITY): Payer: Self-pay | Admitting: Emergency Medicine

## 2014-12-10 ENCOUNTER — Emergency Department (HOSPITAL_COMMUNITY)
Admission: EM | Admit: 2014-12-10 | Discharge: 2014-12-10 | Disposition: A | Discharge status: Home / Self Care | Payer: Medicaid Other | Attending: Emergency Medicine | Admitting: Emergency Medicine

## 2014-12-10 ENCOUNTER — Encounter (HOSPITAL_COMMUNITY): Payer: Self-pay

## 2014-12-10 DIAGNOSIS — J45909 Unspecified asthma, uncomplicated: Secondary | ICD-10-CM | POA: Insufficient documentation

## 2014-12-10 DIAGNOSIS — Z87442 Personal history of urinary calculi: Secondary | ICD-10-CM | POA: Insufficient documentation

## 2014-12-10 DIAGNOSIS — Z880 Allergy status to penicillin: Secondary | ICD-10-CM | POA: Insufficient documentation

## 2014-12-10 DIAGNOSIS — F329 Major depressive disorder, single episode, unspecified: Secondary | ICD-10-CM | POA: Diagnosis not present

## 2014-12-10 DIAGNOSIS — F419 Anxiety disorder, unspecified: Secondary | ICD-10-CM | POA: Insufficient documentation

## 2014-12-10 DIAGNOSIS — Z87891 Personal history of nicotine dependence: Secondary | ICD-10-CM | POA: Insufficient documentation

## 2014-12-10 DIAGNOSIS — F1910 Other psychoactive substance abuse, uncomplicated: Secondary | ICD-10-CM | POA: Diagnosis not present

## 2014-12-10 DIAGNOSIS — Z791 Long term (current) use of non-steroidal anti-inflammatories (NSAID): Secondary | ICD-10-CM | POA: Insufficient documentation

## 2014-12-10 DIAGNOSIS — Z79899 Other long term (current) drug therapy: Secondary | ICD-10-CM | POA: Insufficient documentation

## 2014-12-10 DIAGNOSIS — K088 Other specified disorders of teeth and supporting structures: Secondary | ICD-10-CM | POA: Insufficient documentation

## 2014-12-10 DIAGNOSIS — R1030 Lower abdominal pain, unspecified: Secondary | ICD-10-CM | POA: Diagnosis not present

## 2014-12-10 DIAGNOSIS — R110 Nausea: Secondary | ICD-10-CM | POA: Diagnosis not present

## 2014-12-10 DIAGNOSIS — K0889 Other specified disorders of teeth and supporting structures: Secondary | ICD-10-CM

## 2014-12-10 DIAGNOSIS — Z90710 Acquired absence of both cervix and uterus: Secondary | ICD-10-CM | POA: Diagnosis not present

## 2014-12-10 DIAGNOSIS — K2970 Gastritis, unspecified, without bleeding: Secondary | ICD-10-CM | POA: Insufficient documentation

## 2014-12-10 DIAGNOSIS — Z7289 Other problems related to lifestyle: Secondary | ICD-10-CM | POA: Diagnosis not present

## 2014-12-10 DIAGNOSIS — R1012 Left upper quadrant pain: Secondary | ICD-10-CM | POA: Diagnosis present

## 2014-12-10 DIAGNOSIS — Z8742 Personal history of other diseases of the female genital tract: Secondary | ICD-10-CM | POA: Insufficient documentation

## 2014-12-10 LAB — CBC
HCT: 40.7 % (ref 36.0–46.0)
Hemoglobin: 13.9 g/dL (ref 12.0–15.0)
MCH: 33.1 pg (ref 26.0–34.0)
MCHC: 34.2 g/dL (ref 30.0–36.0)
MCV: 96.9 fL (ref 78.0–100.0)
Platelets: 279 10*3/uL (ref 150–400)
RBC: 4.2 MIL/uL (ref 3.87–5.11)
RDW: 14 % (ref 11.5–15.5)
WBC: 7.2 10*3/uL (ref 4.0–10.5)

## 2014-12-10 MED ORDER — ACIDOPHILUS PROBIOTIC 10 MG PO TABS: 10.0000 mg | ORAL_TABLET | Freq: Three times a day (TID) | ORAL | Status: DC

## 2014-12-10 MED ORDER — OXYCODONE-ACETAMINOPHEN 5-325 MG PO TABS
1.0000 | ORAL_TABLET | Freq: Once | ORAL | Status: AC
  Administered 2014-12-10: 1 via ORAL
  Filled 2014-12-10: qty 1

## 2014-12-10 MED ORDER — ONDANSETRON 4 MG PO TBDP
4.0000 mg | ORAL_TABLET | Freq: Once | ORAL | Status: AC
  Administered 2014-12-10: 4 mg via ORAL
  Filled 2014-12-10: qty 1

## 2014-12-10 MED ORDER — IBUPROFEN 400 MG PO TABS
800.0000 mg | ORAL_TABLET | Freq: Once | ORAL | Status: AC
  Administered 2014-12-10: 800 mg via ORAL
  Filled 2014-12-10: qty 2

## 2014-12-10 MED ORDER — ONDANSETRON 4 MG PO TBDP: 4.0000 mg | ORAL_TABLET | Freq: Three times a day (TID) | ORAL | Status: DC | PRN

## 2014-12-10 MED ORDER — NAPROXEN 500 MG PO TABS: 500.0000 mg | ORAL_TABLET | Freq: Two times a day (BID) | ORAL | Status: DC

## 2014-12-10 MED ORDER — CLINDAMYCIN HCL 150 MG PO CAPS: 450.0000 mg | ORAL_CAPSULE | Freq: Three times a day (TID) | ORAL | Status: DC

## 2014-12-10 NOTE — ED Notes (Signed)
Pt reports she has been having left lower dental pain for a couple weeks.  Pt has previously been seen and presents prescription for clindamycin on arrival.

## 2014-12-10 NOTE — Discharge Instructions (Signed)
Dental Pain °A tooth ache may be caused by cavities (tooth decay). Cavities expose the nerve of the tooth to air and hot or cold temperatures. It may come from an infection or abscess (also called a boil or furuncle) around your tooth. It is also often caused by dental caries (tooth decay). This causes the pain you are having. °DIAGNOSIS  °Your caregiver can diagnose this problem by exam. °TREATMENT  °· If caused by an infection, it may be treated with medications which kill germs (antibiotics) and pain medications as prescribed by your caregiver. Take medications as directed. °· Only take over-the-counter or prescription medicines for pain, discomfort, or fever as directed by your caregiver. °· Whether the tooth ache today is caused by infection or dental disease, you should see your dentist as soon as possible for further care. °SEEK MEDICAL CARE IF: °The exam and treatment you received today has been provided on an emergency basis only. This is not a substitute for complete medical or dental care. If your problem worsens or new problems (symptoms) appear, and you are unable to meet with your dentist, call or return to this location. °SEEK IMMEDIATE MEDICAL CARE IF:  °· You have a fever. °· You develop redness and swelling of your face, jaw, or neck. °· You are unable to open your mouth. °· You have severe pain uncontrolled by pain medicine. °MAKE SURE YOU:  °· Understand these instructions. °· Will watch your condition. °· Will get help right away if you are not doing well or get worse. °Document Released: 03/30/2005 Document Revised: 06/22/2011 Document Reviewed: 11/16/2007 °[employer]® Patient Information ©2015 [employer]. This information is not intended to replace advice given to you by your health care provider. Make sure you discuss any questions you have with your health care provider. °Nausea and Vomiting °Nausea is a sick feeling that often comes before throwing up (vomiting). Vomiting is a reflex where  stomach contents come out of your mouth. Vomiting can cause severe loss of body fluids (dehydration). Children and elderly adults can become dehydrated quickly, especially if they also have diarrhea. Nausea and vomiting are symptoms of a condition or disease. It is important to find the cause of your symptoms. °CAUSES  °· Direct irritation of the stomach lining. This irritation can result from increased acid production (gastroesophageal reflux disease), infection, food poisoning, taking certain medicines (such as nonsteroidal anti-inflammatory drugs), alcohol use, or tobacco use. °· Signals from the brain. These signals could be caused by a headache, heat exposure, an inner ear disturbance, increased pressure in the brain from injury, infection, a tumor, or a concussion, pain, emotional stimulus, or metabolic problems. °· An obstruction in the gastrointestinal tract (bowel obstruction). °· Illnesses such as diabetes, hepatitis, gallbladder problems, appendicitis, kidney problems, cancer, sepsis, atypical symptoms of a heart attack, or eating disorders. °· Medical treatments such as chemotherapy and radiation. °· Receiving medicine that makes you sleep (general anesthetic) during surgery. °DIAGNOSIS °Your caregiver may ask for tests to be done if the problems do not improve after a few days. Tests may also be done if symptoms are severe or if the reason for the nausea and vomiting is not clear. Tests may include: °· Urine tests. °· Blood tests. °· Stool tests. °· Cultures (to look for evidence of infection). °· X-rays or other imaging studies. °Test results can help your caregiver make decisions about treatment or the need for additional tests. °TREATMENT °You need to stay well hydrated. Drink frequently but in small amounts. You may wish   to drink water, sports drinks, clear broth, or eat frozen ice pops or gelatin dessert to help stay hydrated. When you eat, eating slowly may help prevent nausea. There are also some  antinausea medicines that may help prevent nausea. °HOME CARE INSTRUCTIONS  °· Take all medicine as directed by your caregiver. °· If you do not have an appetite, do not force yourself to eat. However, you must continue to drink fluids. °· If you have an appetite, eat a normal diet unless your caregiver tells you differently. °¨ Eat a variety of complex carbohydrates (rice, wheat, potatoes, bread), lean meats, yogurt, fruits, and vegetables. °¨ Avoid high-fat foods because they are more difficult to digest. °· Drink enough water and fluids to keep your urine clear or pale yellow. °· If you are dehydrated, ask your caregiver for specific rehydration instructions. Signs of dehydration may include: °¨ Severe thirst. °¨ Dry lips and mouth. °¨ Dizziness. °¨ Dark urine. °¨ Decreasing urine frequency and amount. °¨ Confusion. °¨ Rapid breathing or pulse. °SEEK IMMEDIATE MEDICAL CARE IF:  °· You have blood or brown flecks (like coffee grounds) in your vomit. °· You have black or bloody stools. °· You have a severe headache or stiff neck. °· You are confused. °· You have severe abdominal pain. °· You have chest pain or trouble breathing. °· You do not urinate at least once every 8 hours. °· You develop cold or clammy skin. °· You continue to vomit for longer than 24 to 48 hours. °· You have a fever. °MAKE SURE YOU:  °· Understand these instructions. °· Will watch your condition. °· Will get help right away if you are not doing well or get worse. °Document Released: 03/30/2005 Document Revised: 06/22/2011 Document Reviewed: 08/27/2010 °[employer]® Patient Information ©2015 [employer]. This information is not intended to replace advice given to you by your health care provider. Make sure you discuss any questions you have with your health care provider. ° °

## 2014-12-10 NOTE — ED Notes (Signed)
Pt. reports intermittent left abdominal pain for several weeks with occasional nausea and vomitting , denies fever or chills.

## 2014-12-10 NOTE — ED Provider Notes (Signed)
CSN: [id number]    Arrival date & time 12/10/14  2104 History   This chart was scribed for non-physician practitioner, Waynetta Pean, PA-C working with Sharlett Iles, MD by Tula Nakayama, ED scribe. This patient was seen in room TR10C/TR10C and the patient's care was started at 9:53 PM   Chief Complaint  Patient presents with  . Dental Pain   The history is provided by the patient. No language interpreter was used.   HPI Comments: [name] is a 29 y.o. female who presents to the Emergency Department complaining of constant, gradually worsening, left lower dental pain that started 2 weeks ago and became sharp 5 days ago. She reports nausea secondary to the pain with intermittent vomiting, left ear pain that started a few weeks ago as associated symptoms. Pt has tried Naprosyn, Hydrocodone and BC powder intermittently without relief to her pain. Pt was seen by Urgent Care a few days ago who prescribed Clindamycin. She has been taking the antibiotics 4 times daily. She reports she has been able to keep down pain medication and clindamycin. Pt has tried following up with her dentist, but notes the earliest appointment is in 1 weeks. Pt denies fevers, purulent discharge from her mouth, hematemesis, new abdominal pain, double vision, ear discharge, HA, light-headedness, dizziness, neck stiffness and trouble swallowing as associated symptoms.   Past Medical History  Diagnosis Date  . Endometriosis   . History of pelvic inflammatory disease   . History of genital warts   . Pelvic pain in female   . Mood disorder     UNSPECIFIED  . History of asthma     with bronchitis  . SVD (spontaneous vaginal delivery)   . Depression   . Anxiety   . History of kidney stones     passed stones, no surgery required   Past Surgical History  Procedure Laterality Date  . Ablation colpoclesis    . Laparoscopy N/A 08/18/2013    Procedure: LAPAROSCOPY DIAGNOSTIC, FULGERATION OF ENDOMETROSIS;  Surgeon:  Cheri Fowler, MD;  Location: [facility];  Service: Gynecology;  Laterality: N/A;  . Dx laparoscopy/ aspiration right ovarian cyst and bx posterior cul-de-sac  05-27-2005  . Essure tubal ligation Bilateral 2011  . Cysto with hydrodistension N/A 12/01/2013    Procedure: CYSTOSCOPY/HYDRODISTENSION with instillation of marcaine and pyridium;  Surgeon: Ardis Hughs, MD;  Location: [facility];  Service: Urology;  Laterality: N/A;  . Wisdom tooth extraction    . Laparoscopic assisted vaginal hysterectomy N/A 06/25/2014    Procedure: LAPAROSCOPIC ASSISTED VAGINAL HYSTERECTOMY;  Surgeon: Cheri Fowler, MD;  Location: [facility];  Service: Gynecology;  Laterality: N/A;  . Bilateral salpingectomy Bilateral 06/25/2014    Procedure: BILATERAL SALPINGECTOMY;  Surgeon: Cheri Fowler, MD;  Location: [facility];  Service: Gynecology;  Laterality: Bilateral;  . Cystoscopy N/A 06/25/2014    Procedure: CYSTOSCOPY;  Surgeon: Cheri Fowler, MD;  Location: [facility];  Service: Gynecology;  Laterality: N/A;   History reviewed. No pertinent family history. Social History  Substance Use Topics  . Smoking status: Former Smoker -- 0.50 packs/day for 4 years    Types: Cigarettes    Quit date: 11/27/2009  . Smokeless tobacco: Never Used  . Alcohol Use: No   OB History    Gravida Para Term Preterm AB TAB SAB Ectopic Multiple Living   2 2 2       2      Review of Systems  Constitutional: Negative for fever and chills.  HENT:  Positive for dental problem and ear pain. Negative for drooling, ear discharge, facial swelling, mouth sores, nosebleeds, rhinorrhea, sore throat and trouble swallowing.   Eyes: Negative for visual disturbance.  Respiratory: Negative for cough.   Gastrointestinal: Positive for nausea and vomiting. Negative for abdominal pain, diarrhea and blood in stool.  Musculoskeletal: Negative for neck pain and neck stiffness.  Skin: Negative for rash.  Neurological: Negative for dizziness,  light-headedness, numbness and headaches.      Allergies  Biaxin; Penicillins; and Toradol  Home Medications   Prior to Admission medications   Medication Sig Start Date End Date Taking? Authorizing Provider  buPROPion (WELLBUTRIN) 100 MG tablet Take 100 mg by mouth 2 (two) times daily.    Historical Provider, MD  clindamycin (CLEOCIN) 150 MG capsule Take 3 capsules (450 mg total) by mouth 3 (three) times daily. 12/10/14   Waynetta Pean, PA-C  HYDROmorphone (DILAUDID) 2 MG tablet Take 0.5 tablets (1 mg total) by mouth every 6 (six) hours as needed for severe pain. 07/31/14   Carmin Muskrat, MD  ibuprofen (ADVIL,MOTRIN) 600 MG tablet Take 1 tablet (600 mg total) by mouth every 6 (six) hours as needed (pain). 06/25/14   Cheri Fowler, MD  Lactobacillus (ACIDOPHILUS PROBIOTIC) 10 MG TABS Take 10 mg by mouth 3 (three) times daily. 12/10/14   Waynetta Pean, PA-C  naproxen (NAPROSYN) 500 MG tablet Take 1 tablet (500 mg total) by mouth 2 (two) times daily with a meal. 12/10/14   Waynetta Pean, PA-C  ondansetron (ZOFRAN ODT) 4 MG disintegrating tablet Take 1 tablet (4 mg total) by mouth every 8 (eight) hours as needed for nausea or vomiting. 12/10/14   Waynetta Pean, PA-C  PRESCRIPTION MEDICATION Place 1 suppository vaginally daily as needed (pelvic pain). Valium Suppository, Compounded at pharmacy    Historical Provider, MD   BP 108/74 mmHg  Pulse 64  Temp(Src) 97.6 F (36.4 C) (Oral)  Resp 16  Ht 5\' 3"  (1.6 m)  Wt 190 lb (86.183 kg)  BMI 33.67 kg/m2  SpO2 100% Physical Exam  Constitutional: She is oriented to person, place, and time. She appears well-developed and well-nourished. No distress.  Non-toxic appearing.   HENT:  Head: Normocephalic and atraumatic.  Right Ear: External ear normal.  Left Ear: External ear normal.  Mouth/Throat: Oropharynx is clear and moist. No oropharyngeal exudate.  Tenderness to left lower molar, which is cracked.  No obvious signs of infection. No  discharge from the mouth. No facial swelling.  Uvula is midline without edema. Soft palate rises symmetrically. No tonsillar hypertrophy or exudates. Tongue protrusion is normal. No trismus.  Bilateral TMs pearly gray without erythema or loss of landmarks Partially edentulous Throats clear; no posterior oropharyngeal erythema or edema  No abscess  Eyes: Conjunctivae and EOM are normal. Pupils are equal, round, and reactive to light. Right eye exhibits no discharge. Left eye exhibits no discharge.  Neck: Normal range of motion. Neck supple. No JVD present. No tracheal deviation present.  Mild submandibular cervical lymphadenopathy Normal ROM of neck  Cardiovascular: Normal rate, regular rhythm, normal heart sounds and intact distal pulses.   Pulmonary/Chest: Effort normal and breath sounds normal. No respiratory distress. She has no wheezes. She has no rales.  Abdominal: Soft. She exhibits no distension. There is no tenderness. There is no guarding.  Lymphadenopathy:    She has cervical adenopathy.  Neurological: She is alert and oriented to person, place, and time. No cranial nerve deficit. Coordination normal.  Skin: Skin is warm and  dry. No rash noted. She is not diaphoretic. No erythema. No pallor.  Psychiatric: She has a normal mood and affect. Her behavior is normal.  Nursing note and vitals reviewed.   ED Course  Procedures   DIAGNOSTIC STUDIES: Oxygen Saturation is 97% on RA, normal by my interpretation.    COORDINATION OF CARE: 10:00 PM Discussed treatment plan with pt which includes Zofran and Ibuprofen. Will prescribe Naprosyn and pt agreed to plan.   Labs Review Labs Reviewed - No data to display  Imaging Review No results found.   EKG Interpretation None      Filed Vitals:   12/10/14 2110 12/10/14 2233  BP: 126/83 108/74  Pulse: 73 64  Temp: 97.7 F (36.5 C) 97.6 F (36.4 C)  TempSrc: Oral Oral  Resp: 16 16  Height: 5\' 3"  (1.6 m)   Weight: 190 lb (86.183  kg)   SpO2: 97% 100%     MDM   Meds given in ED:  Medications  ondansetron (ZOFRAN-ODT) disintegrating tablet 4 mg (4 mg Oral Given 12/10/14 2205)  ibuprofen (ADVIL,MOTRIN) tablet 800 mg (800 mg Oral Given 12/10/14 2205)  oxyCODONE-acetaminophen (PERCOCET/ROXICET) 5-325 MG per tablet 1 tablet (1 tablet Oral Given 12/10/14 2237)    New Prescriptions   CLINDAMYCIN (CLEOCIN) 150 MG CAPSULE    Take 3 capsules (450 mg total) by mouth 3 (three) times daily.   LACTOBACILLUS (ACIDOPHILUS PROBIOTIC) 10 MG TABS    Take 10 mg by mouth 3 (three) times daily.   NAPROXEN (NAPROSYN) 500 MG TABLET    Take 1 tablet (500 mg total) by mouth 2 (two) times daily with a meal.   ONDANSETRON (ZOFRAN ODT) 4 MG DISINTEGRATING TABLET    Take 1 tablet (4 mg total) by mouth every 8 (eight) hours as needed for nausea or vomiting.    Final diagnoses:  Pain, dental  Nausea   Patient with left lower toothache. No gross abscess.  Exam unconcerning for Ludwig's angina or spread of infection. Patient also reported nausea with increasing pain and has had vomiting. She reports that she has been able to keep down pain medication and antibiotics though. She received Zofran, percocet, and ibuprofen in the ED and tolerated this well without vomiting. Her abdomen is soft and non-tender to palpation. Will treat with penicillin and pain medicine.  Urged patient to follow-up with dentist Dr. Mariel Sleet. I advised the patient to follow-up with their primary care provider this week. I advised the patient to return to the emergency department with new or worsening symptoms or new concerns. The patient verbalized understanding and agreement with plan.    I personally performed the services described in this documentation, which was scribed in my presence. The recorded information has been reviewed and is accurate.    Waynetta Pean, PA-C 12/10/14 [place], MD 12/11/14 [phone]

## 2014-12-10 NOTE — ED Notes (Signed)
Pt stable, ambulatory, states understanding of discharge instructions 

## 2014-12-11 ENCOUNTER — Inpatient Hospital Stay (HOSPITAL_COMMUNITY): Payer: Medicaid Other

## 2014-12-11 ENCOUNTER — Encounter (HOSPITAL_COMMUNITY): Payer: Self-pay | Admitting: *Deleted

## 2014-12-11 ENCOUNTER — Inpatient Hospital Stay (HOSPITAL_COMMUNITY)
Admission: AD | Admit: 2014-12-11 | Discharge: 2014-12-11 | Disposition: A | Discharge status: Home / Self Care | Payer: Medicaid Other | Source: Ambulatory Visit | Attending: Family Medicine | Admitting: Family Medicine

## 2014-12-11 ENCOUNTER — Emergency Department (HOSPITAL_COMMUNITY)
Admission: EM | Admit: 2014-12-11 | Discharge: 2014-12-11 | Disposition: A | Discharge status: Home / Self Care | Payer: Medicaid Other | Source: Home / Self Care | Attending: Emergency Medicine | Admitting: Emergency Medicine

## 2014-12-11 DIAGNOSIS — F329 Major depressive disorder, single episode, unspecified: Secondary | ICD-10-CM | POA: Insufficient documentation

## 2014-12-11 DIAGNOSIS — F1910 Other psychoactive substance abuse, uncomplicated: Secondary | ICD-10-CM

## 2014-12-11 DIAGNOSIS — Z7289 Other problems related to lifestyle: Secondary | ICD-10-CM | POA: Insufficient documentation

## 2014-12-11 DIAGNOSIS — R1030 Lower abdominal pain, unspecified: Secondary | ICD-10-CM

## 2014-12-11 DIAGNOSIS — R1012 Left upper quadrant pain: Secondary | ICD-10-CM

## 2014-12-11 DIAGNOSIS — F419 Anxiety disorder, unspecified: Secondary | ICD-10-CM | POA: Insufficient documentation

## 2014-12-11 DIAGNOSIS — Z87442 Personal history of urinary calculi: Secondary | ICD-10-CM | POA: Insufficient documentation

## 2014-12-11 DIAGNOSIS — Z880 Allergy status to penicillin: Secondary | ICD-10-CM | POA: Insufficient documentation

## 2014-12-11 DIAGNOSIS — J45909 Unspecified asthma, uncomplicated: Secondary | ICD-10-CM | POA: Insufficient documentation

## 2014-12-11 DIAGNOSIS — Z90710 Acquired absence of both cervix and uterus: Secondary | ICD-10-CM | POA: Insufficient documentation

## 2014-12-11 DIAGNOSIS — Z87891 Personal history of nicotine dependence: Secondary | ICD-10-CM | POA: Insufficient documentation

## 2014-12-11 DIAGNOSIS — K2970 Gastritis, unspecified, without bleeding: Secondary | ICD-10-CM

## 2014-12-11 DIAGNOSIS — Z765 Malingerer [conscious simulation]: Secondary | ICD-10-CM

## 2014-12-11 LAB — COMPREHENSIVE METABOLIC PANEL
ALT: 47 U/L (ref 14–54)
AST: 36 U/L (ref 15–41)
Albumin: 4 g/dL (ref 3.5–5.0)
Alkaline Phosphatase: 54 U/L (ref 38–126)
Anion gap: 7 (ref 5–15)
BUN: 12 mg/dL (ref 6–20)
CO2: 25 mmol/L (ref 22–32)
Calcium: 9.2 mg/dL (ref 8.9–10.3)
Chloride: 110 mmol/L (ref 101–111)
Creatinine, Ser: 0.87 mg/dL (ref 0.44–1.00)
GFR calc Af Amer: >60 mL/min (ref >60)
GFR calc non Af Amer: >60 mL/min (ref >60)
Glucose, Bld: 94 mg/dL (ref 65–99)
Potassium: 4 mmol/L (ref 3.5–5.1)
Sodium: 142 mmol/L (ref 135–145)
Total Bilirubin: 0.3 mg/dL (ref 0.3–1.2)
Total Protein: 7.2 g/dL (ref 6.5–8.1)

## 2014-12-11 LAB — URINALYSIS, ROUTINE W REFLEX MICROSCOPIC
Bilirubin Urine: NEGATIVE
Glucose, UA: NEGATIVE mg/dL
Hgb urine dipstick: NEGATIVE
Ketones, ur: NEGATIVE mg/dL
Leukocytes, UA: NEGATIVE
Nitrite: NEGATIVE
Protein, ur: NEGATIVE mg/dL
Specific Gravity, Urine: >1.03 — ABNORMAL HIGH (ref 1.005–1.030)
Urobilinogen, UA: 0.2 mg/dL (ref 0.0–1.0)
pH: 5.5 (ref 5.0–8.0)

## 2014-12-11 LAB — POCT PREGNANCY, URINE: Preg Test, Ur: NEGATIVE

## 2014-12-11 LAB — LIPASE, BLOOD: Lipase: 15 U/L — ABNORMAL LOW (ref 22–51)

## 2014-12-11 MED ORDER — FAMOTIDINE 20 MG PO TABS: 20.0000 mg | ORAL_TABLET | Freq: Two times a day (BID) | ORAL | Status: DC

## 2014-12-11 MED ORDER — DICYCLOMINE HCL 20 MG PO TABS: 20.0000 mg | ORAL_TABLET | Freq: Two times a day (BID) | ORAL | Status: DC

## 2014-12-11 MED ORDER — SUCRALFATE 1 GM/10ML PO SUSP: 1.0000 g | Freq: Three times a day (TID) | ORAL | Status: DC

## 2014-12-11 MED ORDER — ONDANSETRON 4 MG PO TBDP
8.0000 mg | ORAL_TABLET | Freq: Once | ORAL | Status: AC
  Administered 2014-12-11: 8 mg via ORAL
  Filled 2014-12-11: qty 2

## 2014-12-11 MED ORDER — OXYCODONE-ACETAMINOPHEN 5-325 MG PO TABS
2.0000 | ORAL_TABLET | Freq: Once | ORAL | Status: AC
  Administered 2014-12-11: 2 via ORAL
  Filled 2014-12-11: qty 2

## 2014-12-11 MED ORDER — DICYCLOMINE HCL 10 MG PO CAPS
10.0000 mg | ORAL_CAPSULE | Freq: Once | ORAL | Status: AC
  Administered 2014-12-11: 10 mg via ORAL
  Filled 2014-12-11: qty 1

## 2014-12-11 MED ORDER — GI COCKTAIL ~~LOC~~
30.0000 mL | Freq: Once | ORAL | Status: AC
  Administered 2014-12-11: 30 mL via ORAL
  Filled 2014-12-11: qty 30

## 2014-12-11 NOTE — Discharge Instructions (Signed)

## 2014-12-11 NOTE — Discharge Instructions (Signed)

## 2014-12-11 NOTE — ED Notes (Signed)
PT requesting to speak with someone in charge, refusing to leave until she does.  Charge notified, [employer] called.

## 2014-12-11 NOTE — MAU Note (Signed)
Patient presents to mau with c/o of severe abdominal pain that has gotten worse over the last two days. Reports a recent hysterectomy. First noticed the pain 2 weeks ago but was dull. States does have a history of ovarian cysts. Patient also reports she may have a

## 2014-12-11 NOTE — ED Provider Notes (Signed)
CSN: [id number]     Arrival date & time 12/10/14  2249 History   First MD Initiated Contact with Patient 12/11/14 0118     Chief Complaint  Patient presents with  . Abdominal Pain     (Consider location/radiation/quality/duration/timing/severity/associated sxs/prior Treatment) HPI Comments: 29 year old female with a history of endometriosis, mood disorder, depression, and anxiety presents to the emergency department for evaluation of abdominal pain. Patient checked back into the emergency department immediately following discharge after being seen for dental pain. She is now claiming that she has pain in her abdomen which has been present for 2 weeks. She states the pain is located in her left upper quadrant. It was intermittent at first and has become constant over the last week, per patient. She states that she has had nausea as well as vomiting and she feels dehydrated. She states that she has been having normal bowel movements. Her last bowel movement was earlier today. She denies any associated fever, urinary symptoms, and hematemesis abdominal surgical history significant for hysterectomy.  Patient is a 29 y.o. female presenting with abdominal pain. The history is provided by the patient. No language interpreter was used.  Abdominal Pain Associated symptoms: nausea and vomiting   Associated symptoms: no diarrhea and no fever     Past Medical History  Diagnosis Date  . Endometriosis   . History of pelvic inflammatory disease   . History of genital warts   . Pelvic pain in female   . Mood disorder     UNSPECIFIED  . History of asthma     with bronchitis  . SVD (spontaneous vaginal delivery)   . Depression   . Anxiety   . History of kidney stones     passed stones, no surgery required   Past Surgical History  Procedure Laterality Date  . Ablation colpoclesis    . Laparoscopy N/A 08/18/2013    Procedure: LAPAROSCOPY DIAGNOSTIC, FULGERATION OF ENDOMETROSIS;  Surgeon: Cheri Fowler, MD;  Location: [facility];  Service: Gynecology;  Laterality: N/A;  . Dx laparoscopy/ aspiration right ovarian cyst and bx posterior cul-de-sac  05-27-2005  . Essure tubal ligation Bilateral 2011  . Cysto with hydrodistension N/A 12/01/2013    Procedure: CYSTOSCOPY/HYDRODISTENSION with instillation of marcaine and pyridium;  Surgeon: Ardis Hughs, MD;  Location: [facility];  Service: Urology;  Laterality: N/A;  . Wisdom tooth extraction    . Laparoscopic assisted vaginal hysterectomy N/A 06/25/2014    Procedure: LAPAROSCOPIC ASSISTED VAGINAL HYSTERECTOMY;  Surgeon: Cheri Fowler, MD;  Location: [facility];  Service: Gynecology;  Laterality: N/A;  . Bilateral salpingectomy Bilateral 06/25/2014    Procedure: BILATERAL SALPINGECTOMY;  Surgeon: Cheri Fowler, MD;  Location: [facility];  Service: Gynecology;  Laterality: Bilateral;  . Cystoscopy N/A 06/25/2014    Procedure: CYSTOSCOPY;  Surgeon: Cheri Fowler, MD;  Location: [facility];  Service: Gynecology;  Laterality: N/A;   No family history on file. Social History  Substance Use Topics  . Smoking status: Former Smoker -- 0.50 packs/day for 4 years    Types: Cigarettes    Quit date: 11/27/2009  . Smokeless tobacco: Never Used  . Alcohol Use: No   OB History    Gravida Para Term Preterm AB TAB SAB Ectopic Multiple Living   2 2 2       2       Review of Systems  Constitutional: Negative for fever.  Gastrointestinal: Positive for nausea, vomiting and abdominal pain. Negative for diarrhea.  All other systems  reviewed and are negative.   Allergies  Biaxin; Penicillins; and Toradol  Home Medications   Prior to Admission medications   Medication Sig Start Date End Date Taking? Authorizing Provider  buPROPion (WELLBUTRIN) 100 MG tablet Take 100 mg by mouth 2 (two) times daily.    Historical Provider, MD  clindamycin (CLEOCIN) 150 MG capsule Take 3 capsules (450 mg total) by mouth 3 (three) times daily. 12/10/14    Waynetta Pean, PA-C  dicyclomine (BENTYL) 20 MG tablet Take 1 tablet (20 mg total) by mouth 2 (two) times daily. 12/11/14   Antonietta Breach, PA-C  famotidine (PEPCID) 20 MG tablet Take 1 tablet (20 mg total) by mouth 2 (two) times daily. 12/11/14   Antonietta Breach, PA-C  HYDROmorphone (DILAUDID) 2 MG tablet Take 0.5 tablets (1 mg total) by mouth every 6 (six) hours as needed for severe pain. 07/31/14   Carmin Muskrat, MD  ibuprofen (ADVIL,MOTRIN) 600 MG tablet Take 1 tablet (600 mg total) by mouth every 6 (six) hours as needed (pain). 06/25/14   Cheri Fowler, MD  Lactobacillus (ACIDOPHILUS PROBIOTIC) 10 MG TABS Take 10 mg by mouth 3 (three) times daily. 12/10/14   Waynetta Pean, PA-C  naproxen (NAPROSYN) 500 MG tablet Take 1 tablet (500 mg total) by mouth 2 (two) times daily with a meal. 12/10/14   Waynetta Pean, PA-C  ondansetron (ZOFRAN ODT) 4 MG disintegrating tablet Take 1 tablet (4 mg total) by mouth every 8 (eight) hours as needed for nausea or vomiting. 12/10/14   Waynetta Pean, PA-C  PRESCRIPTION MEDICATION Place 1 suppository vaginally daily as needed (pelvic pain). Valium Suppository, Compounded at pharmacy    Historical Provider, MD  sucralfate (CARAFATE) 1 GM/10ML suspension Take 10 mLs (1 g total) by mouth 4 (four) times daily -  with meals and at bedtime. 12/11/14   Antonietta Breach, PA-C   BP 122/84 mmHg  Pulse 67  Temp(Src) 97.6 F (36.4 C) (Oral)  Resp 16  Ht 5\' 3"  (1.6 m)  Wt 190 lb (86.183 kg)  BMI 33.67 kg/m2  SpO2 100%  LMP 06/06/2014 (Approximate)   Physical Exam  Constitutional: She is oriented to person, place, and time. She appears well-developed and well-nourished. No distress.  HENT:  Head: Normocephalic and atraumatic.  Eyes: Conjunctivae and EOM are normal. No scleral icterus.  Neck: Normal range of motion.  Cardiovascular: Normal rate, regular rhythm and intact distal pulses.   Pulmonary/Chest: Effort normal. No respiratory distress. She has no wheezes.  Respirations  even and unlabored  Abdominal: Soft. She exhibits no distension. There is tenderness. There is no rebound.  Mild TTP in the LUQ on deep palpation. Abdomen soft. No peritoneal signs.  Musculoskeletal: Normal range of motion.  Neurological: She is alert and oriented to person, place, and time. She exhibits normal muscle tone. Coordination normal.  Skin: Skin is warm and dry. No rash noted. She is not diaphoretic. No erythema. No pallor.  Psychiatric: She has a normal mood and affect. Her behavior is normal.  Nursing note and vitals reviewed.   ED Course  Procedures (including critical care time) Labs Review Labs Reviewed  LIPASE, BLOOD - Abnormal; Notable for the following:    Lipase 15 (*)    All other components within normal limits  COMPREHENSIVE METABOLIC PANEL  CBC    Imaging Review No results found.   I have personally reviewed and evaluated these images and lab results as part of my medical decision-making.   EKG Interpretation None  MDM   Final diagnoses:  Left upper quadrant pain  Gastritis    29 year old female presents to the emergency Department complaining of abdominal pain 2 weeks. She checked into the emergency department immediately following discharge at 2300 after being seen for dental pain. Per prior provider, patient was told when she only received 1 tablet of Percocet in the ED. Patient's abdominal pain has been ongoing over the past 2 weeks. Doubt emergent process. Patient has no fever or leukocytosis today. Her abdomen is soft with some mild focal tenderness on deep palpation in the left upper quadrant. Liver and kidney function preserved. Lipase normal.  Symptoms likely secondary to gastritis. Have counseled the patient on outpatient gastroenterology follow-up. Patient expresses concern over dehydration, though exhibits no clinical signs of dehydration; no AKI from severe dehydration, no tachycardia. Patient given prescriptions for Carafate, Bentyl,  and Pepcid to add to her prescriptions given from her prior visit today. Return precautions given at discharge. Patient discharged in good condition.   Filed Vitals:   12/10/14 2258 12/11/14 0115 12/11/14 0132  BP: 116/65 122/84   Pulse: 67    Temp: 97.6 F (36.4 C)  97.6 F (36.4 C)  TempSrc: Oral  Oral  Resp: 16    Height: 5\' 3"  (1.6 m)    Weight: 190 lb (86.183 kg)    SpO2: 100%       Antonietta Breach, PA-C 12/11/14 0140  April Palumbo, MD 12/11/14 [phone]

## 2014-12-11 NOTE — MAU Note (Signed)
Patient confirmed has a ride home prior to pain medication administration.

## 2014-12-11 NOTE — MAU Provider Note (Signed)
History     CSN: [id number]  Arrival date and time: 12/11/14 0231   None     Chief Complaint  Patient presents with  . Abdominal Pain   HPI Comments: [name] is a 29 y.o. [id number] who presents today with abdominal pain. She was seen at the ED twice today, and this is her third visit in less than 12 hours. Once for dental pain and once for abdominal pain. She also states that she was seen by her PCP earlier in the day as well. She states that she has had the pain for about two weeks. She states that intercourse is very painful. She states that she has vaginal valium suppositories.   Abdominal Pain This is a new problem. The current episode started 1 to 4 weeks ago. The onset quality is gradual. The problem occurs constantly. The problem has been unchanged. The pain is located in the suprapubic region. The pain is at a severity of 9/10. The quality of the pain is cramping. The abdominal pain does not radiate. Associated symptoms include nausea and vomiting. Pertinent negatives include no constipation, diarrhea, dysuria, fever or frequency. Nothing aggravates the pain. The pain is relieved by nothing. She has tried nothing for the symptoms.    Past Medical History  Diagnosis Date  . Endometriosis   . History of pelvic inflammatory disease   . History of genital warts   . Pelvic pain in female   . Mood disorder     UNSPECIFIED  . History of asthma     with bronchitis  . SVD (spontaneous vaginal delivery)   . Depression   . Anxiety   . History of kidney stones     passed stones, no surgery required    Past Surgical History  Procedure Laterality Date  . Ablation colpoclesis    . Laparoscopy N/A 08/18/2013    Procedure: LAPAROSCOPY DIAGNOSTIC, FULGERATION OF ENDOMETROSIS;  Surgeon: Cheri Fowler, MD;  Location: [facility];  Service: Gynecology;  Laterality: N/A;  . Dx laparoscopy/ aspiration right ovarian cyst and bx posterior cul-de-sac  05-27-2005  . Essure tubal ligation  Bilateral 2011  . Cysto with hydrodistension N/A 12/01/2013    Procedure: CYSTOSCOPY/HYDRODISTENSION with instillation of marcaine and pyridium;  Surgeon: Ardis Hughs, MD;  Location: [facility];  Service: Urology;  Laterality: N/A;  . Wisdom tooth extraction    . Laparoscopic assisted vaginal hysterectomy N/A 06/25/2014    Procedure: LAPAROSCOPIC ASSISTED VAGINAL HYSTERECTOMY;  Surgeon: Cheri Fowler, MD;  Location: [facility];  Service: Gynecology;  Laterality: N/A;  . Bilateral salpingectomy Bilateral 06/25/2014    Procedure: BILATERAL SALPINGECTOMY;  Surgeon: Cheri Fowler, MD;  Location: [facility];  Service: Gynecology;  Laterality: Bilateral;  . Cystoscopy N/A 06/25/2014    Procedure: CYSTOSCOPY;  Surgeon: Cheri Fowler, MD;  Location: [facility];  Service: Gynecology;  Laterality: N/A;    History reviewed. No pertinent family history.  Social History  Substance Use Topics  . Smoking status: Former Smoker -- 0.50 packs/day for 4 years    Types: Cigarettes    Quit date: 11/27/2009  . Smokeless tobacco: Never Used  . Alcohol Use: No    Allergies:  Allergies  Allergen Reactions  . Biaxin [Clarithromycin] Rash  . Penicillins Rash  . Toradol [Ketorolac Tromethamine] Rash    Tolerates ibuprofen    Prescriptions prior to admission  Medication Sig Dispense Refill Last Dose  . buPROPion (WELLBUTRIN) 100 MG tablet Take 100 mg by mouth 2 (two) times  daily.   07/30/2014 at Unknown time  . clindamycin (CLEOCIN) 150 MG capsule Take 3 capsules (450 mg total) by mouth 3 (three) times daily. 90 capsule 0   . dicyclomine (BENTYL) 20 MG tablet Take 1 tablet (20 mg total) by mouth 2 (two) times daily. 20 tablet 0   . famotidine (PEPCID) 20 MG tablet Take 1 tablet (20 mg total) by mouth 2 (two) times daily. 30 tablet 0   . HYDROmorphone (DILAUDID) 2 MG tablet Take 0.5 tablets (1 mg total) by mouth every 6 (six) hours as needed for severe pain. 15 tablet 0   . ibuprofen (ADVIL,MOTRIN)  600 MG tablet Take 1 tablet (600 mg total) by mouth every 6 (six) hours as needed (pain). 30 tablet 0 07/30/2014 at Unknown time  . Lactobacillus (ACIDOPHILUS PROBIOTIC) 10 MG TABS Take 10 mg by mouth 3 (three) times daily. 30 tablet 0   . naproxen (NAPROSYN) 500 MG tablet Take 1 tablet (500 mg total) by mouth 2 (two) times daily with a meal. 30 tablet 0   . ondansetron (ZOFRAN ODT) 4 MG disintegrating tablet Take 1 tablet (4 mg total) by mouth every 8 (eight) hours as needed for nausea or vomiting. 10 tablet 0   . PRESCRIPTION MEDICATION Place 1 suppository vaginally daily as needed (pelvic pain). Valium Suppository, Compounded at pharmacy   last month  . sucralfate (CARAFATE) 1 GM/10ML suspension Take 10 mLs (1 g total) by mouth 4 (four) times daily -  with meals and at bedtime. 420 mL 0     Review of Systems  Constitutional: Negative for fever.  Gastrointestinal: Positive for nausea, vomiting and abdominal pain. Negative for diarrhea and constipation.  Genitourinary: Negative for dysuria, urgency and frequency.   Physical Exam   Blood pressure 123/78, pulse 66, temperature 98.6 F (37 C), temperature source Oral, resp. rate 16, height 5\' 3"  (1.6 m), weight 87.181 kg (192 lb 3.2 oz), last menstrual period 06/06/2014, SpO2 99 %.  Physical Exam  Nursing note and vitals reviewed. Constitutional: She is oriented to person, place, and time. She appears well-developed and well-nourished. No distress.  HENT:  Head: Normocephalic.  Cardiovascular: Normal rate.   Respiratory: Effort normal.  GI: Soft. There is no tenderness. There is no rebound.  Neurological: She is alert and oriented to person, place, and time.  Skin: Skin is warm and dry.  Psychiatric: She has a normal mood and affect.   Results for orders placed or performed during the hospital encounter of 12/11/14 (from the past 24 hour(s))  Urinalysis, Routine w reflex microscopic (not at [facility])     Status: Abnormal   Collection Time:  12/11/14  2:49 AM  Result Value Ref Range   Color, Urine YELLOW YELLOW   APPearance HAZY (A) CLEAR   Specific Gravity, Urine >1.030 (H) 1.005 - 1.030   pH 5.5 5.0 - 8.0   Glucose, UA NEGATIVE NEGATIVE mg/dL   Hgb urine dipstick NEGATIVE NEGATIVE   Bilirubin Urine NEGATIVE NEGATIVE   Ketones, ur NEGATIVE NEGATIVE mg/dL   Protein, ur NEGATIVE NEGATIVE mg/dL   Urobilinogen, UA 0.2 0.0 - 1.0 mg/dL   Nitrite NEGATIVE NEGATIVE   Leukocytes, UA NEGATIVE NEGATIVE  Pregnancy, urine POC     Status: None   Collection Time: 12/11/14  3:08 AM  Result Value Ref Range   Preg Test, Ur NEGATIVE NEGATIVE   US Transvaginal Non-ob  12/11/2014   CLINICAL DATA:  Left upper quadrant abdominal pain, 2 weeks duration.  EXAM: TRANSABDOMINAL  AND TRANSVAGINAL ULTRASOUND OF PELVIS  DOPPLER ULTRASOUND OF OVARIES  TECHNIQUE: Both transabdominal and transvaginal ultrasound examinations of the pelvis were performed. Transabdominal technique was performed for global imaging of the pelvis including uterus, ovaries, adnexal regions, and pelvic cul-de-sac.  It was necessary to proceed with endovaginal exam following the transabdominal exam to visualize the ovaries. Color and duplex Doppler ultrasound was utilized to evaluate blood flow to the ovaries.  COMPARISON:  None.  FINDINGS: Uterus  Hysterectomy.  Right ovary  Measurements: 2.9 x 2.2 x 1.8 cm. Normal appearance/no adnexal mass.  Left ovary  Measurements: 3.1 x 2.2 x 2.5 cm. Normal appearance/no adnexal mass.  Pulsed Doppler evaluation of both ovaries demonstrates normal low-resistance arterial and venous waveforms.  Other findings  No free fluid.  IMPRESSION: Normal ovaries. Doppler confirms intact perfusion of both ovaries. No abnormal pelvic fluid collections.   Electronically Signed   By: Andreas Newport M.D.   On: 12/11/2014 04:50   US Pelvis Complete  12/11/2014   CLINICAL DATA:  Left upper quadrant abdominal pain, 2 weeks duration.  EXAM: TRANSABDOMINAL AND  TRANSVAGINAL ULTRASOUND OF PELVIS  DOPPLER ULTRASOUND OF OVARIES  TECHNIQUE: Both transabdominal and transvaginal ultrasound examinations of the pelvis were performed. Transabdominal technique was performed for global imaging of the pelvis including uterus, ovaries, adnexal regions, and pelvic cul-de-sac.  It was necessary to proceed with endovaginal exam following the transabdominal exam to visualize the ovaries. Color and duplex Doppler ultrasound was utilized to evaluate blood flow to the ovaries.  COMPARISON:  None.  FINDINGS: Uterus  Hysterectomy.  Right ovary  Measurements: 2.9 x 2.2 x 1.8 cm. Normal appearance/no adnexal mass.  Left ovary  Measurements: 3.1 x 2.2 x 2.5 cm. Normal appearance/no adnexal mass.  Pulsed Doppler evaluation of both ovaries demonstrates normal low-resistance arterial and venous waveforms.  Other findings  No free fluid.  IMPRESSION: Normal ovaries. Doppler confirms intact perfusion of both ovaries. No abnormal pelvic fluid collections.   Electronically Signed   By: Andreas Newport M.D.   On: 12/11/2014 04:50   [place] Art/ven Flow Abd Pelv Doppler  12/11/2014   CLINICAL DATA:  Left upper quadrant abdominal pain, 2 weeks duration.  EXAM: TRANSABDOMINAL AND TRANSVAGINAL ULTRASOUND OF PELVIS  DOPPLER ULTRASOUND OF OVARIES  TECHNIQUE: Both transabdominal and transvaginal ultrasound examinations of the pelvis were performed. Transabdominal technique was performed for global imaging of the pelvis including uterus, ovaries, adnexal regions, and pelvic cul-de-sac.  It was necessary to proceed with endovaginal exam following the transabdominal exam to visualize the ovaries. Color and duplex Doppler ultrasound was utilized to evaluate blood flow to the ovaries.  COMPARISON:  None.  FINDINGS: Uterus  Hysterectomy.  Right ovary  Measurements: 2.9 x 2.2 x 1.8 cm. Normal appearance/no adnexal mass.  Left ovary  Measurements: 3.1 x 2.2 x 2.5 cm. Normal appearance/no adnexal mass.  Pulsed Doppler  evaluation of both ovaries demonstrates normal low-resistance arterial and venous waveforms.  Other findings  No free fluid.  IMPRESSION: Normal ovaries. Doppler confirms intact perfusion of both ovaries. No abnormal pelvic fluid collections.   Electronically Signed   By: Andreas Newport M.D.   On: 12/11/2014 04:50    MAU Course  Procedures  MDM Patient has refused toradol and ibuprofen for her pain.  Patient had two percocet, states pain is better now. She is requesting a rx for home use of percocet. Patient advised to FU with PCP for further pain management.   Assessment and Plan   1. Abdominal  pain, lower   2. Drug-seeking behavior    DC home Comfort measures reviewed  RX: none  Return to MAU as needed FU with OB/GYN os PCP as needed    Follow-up Information    Follow up with [facility].   Why:  If symptoms worsen   Contact information:   [place] [phone]         Mathis Bud 12/11/2014, 5:17 AM

## 2014-12-11 NOTE — MAU Note (Signed)
Patient declines ibuprofen at this time. States "I know it will not work."

## 2014-12-11 NOTE — MAU Note (Addendum)
Patient stating she wants something for pain prior to ultrasound and would like to hold off on ultrasound until examined. CNM made aware; percocet order received.

## 2015-01-07 ENCOUNTER — Emergency Department (HOSPITAL_COMMUNITY): Payer: Medicaid Other

## 2015-01-07 ENCOUNTER — Encounter (HOSPITAL_COMMUNITY): Payer: Self-pay | Admitting: Family Medicine

## 2015-01-07 ENCOUNTER — Emergency Department (HOSPITAL_COMMUNITY)
Admission: EM | Admit: 2015-01-07 | Discharge: 2015-01-07 | Disposition: A | Discharge status: Home / Self Care | Payer: Medicaid Other | Attending: Emergency Medicine | Admitting: Emergency Medicine

## 2015-01-07 DIAGNOSIS — R1013 Epigastric pain: Secondary | ICD-10-CM | POA: Diagnosis not present

## 2015-01-07 DIAGNOSIS — J45909 Unspecified asthma, uncomplicated: Secondary | ICD-10-CM | POA: Diagnosis not present

## 2015-01-07 DIAGNOSIS — Z8619 Personal history of other infectious and parasitic diseases: Secondary | ICD-10-CM | POA: Diagnosis not present

## 2015-01-07 DIAGNOSIS — Z79899 Other long term (current) drug therapy: Secondary | ICD-10-CM | POA: Insufficient documentation

## 2015-01-07 DIAGNOSIS — R197 Diarrhea, unspecified: Secondary | ICD-10-CM | POA: Diagnosis not present

## 2015-01-07 DIAGNOSIS — R1011 Right upper quadrant pain: Secondary | ICD-10-CM | POA: Diagnosis present

## 2015-01-07 DIAGNOSIS — R112 Nausea with vomiting, unspecified: Secondary | ICD-10-CM | POA: Diagnosis not present

## 2015-01-07 DIAGNOSIS — F419 Anxiety disorder, unspecified: Secondary | ICD-10-CM | POA: Insufficient documentation

## 2015-01-07 DIAGNOSIS — Z87891 Personal history of nicotine dependence: Secondary | ICD-10-CM | POA: Diagnosis not present

## 2015-01-07 DIAGNOSIS — F329 Major depressive disorder, single episode, unspecified: Secondary | ICD-10-CM | POA: Diagnosis not present

## 2015-01-07 DIAGNOSIS — Z880 Allergy status to penicillin: Secondary | ICD-10-CM | POA: Insufficient documentation

## 2015-01-07 DIAGNOSIS — Z8742 Personal history of other diseases of the female genital tract: Secondary | ICD-10-CM | POA: Insufficient documentation

## 2015-01-07 DIAGNOSIS — Z87442 Personal history of urinary calculi: Secondary | ICD-10-CM | POA: Insufficient documentation

## 2015-01-07 LAB — LIPASE, BLOOD: Lipase: 20 U/L — ABNORMAL LOW (ref 22–51)

## 2015-01-07 LAB — URINALYSIS, ROUTINE W REFLEX MICROSCOPIC
Bilirubin Urine: NEGATIVE
Glucose, UA: NEGATIVE mg/dL
Hgb urine dipstick: NEGATIVE
Ketones, ur: 15 mg/dL — AB
Leukocytes, UA: NEGATIVE
Nitrite: NEGATIVE
Protein, ur: NEGATIVE mg/dL
Specific Gravity, Urine: 1.021 (ref 1.005–1.030)
Urobilinogen, UA: 0.2 mg/dL (ref 0.0–1.0)
pH: 8 (ref 5.0–8.0)

## 2015-01-07 LAB — COMPREHENSIVE METABOLIC PANEL
ALT: 46 U/L (ref 14–54)
AST: 34 U/L (ref 15–41)
Albumin: 3.7 g/dL (ref 3.5–5.0)
Alkaline Phosphatase: 47 U/L (ref 38–126)
Anion gap: 8 (ref 5–15)
BUN: 16 mg/dL (ref 6–20)
CO2: 26 mmol/L (ref 22–32)
Calcium: 9.2 mg/dL (ref 8.9–10.3)
Chloride: 109 mmol/L (ref 101–111)
Creatinine, Ser: 1.03 mg/dL — ABNORMAL HIGH (ref 0.44–1.00)
GFR calc Af Amer: >60 mL/min (ref >60)
GFR calc non Af Amer: >60 mL/min (ref >60)
Glucose, Bld: 103 mg/dL — ABNORMAL HIGH (ref 65–99)
Potassium: 4.1 mmol/L (ref 3.5–5.1)
Sodium: 143 mmol/L (ref 135–145)
Total Bilirubin: 0.3 mg/dL (ref 0.3–1.2)
Total Protein: 6.6 g/dL (ref 6.5–8.1)

## 2015-01-07 LAB — CBC
HCT: 39.2 % (ref 36.0–46.0)
Hemoglobin: 13.3 g/dL (ref 12.0–15.0)
MCH: 33.3 pg (ref 26.0–34.0)
MCHC: 33.9 g/dL (ref 30.0–36.0)
MCV: 98.2 fL (ref 78.0–100.0)
Platelets: 257 10*3/uL (ref 150–400)
RBC: 3.99 MIL/uL (ref 3.87–5.11)
RDW: 14.2 % (ref 11.5–15.5)
WBC: 6.8 10*3/uL (ref 4.0–10.5)

## 2015-01-07 MED ORDER — HYDROCODONE-ACETAMINOPHEN 5-325 MG PO TABS: 2.0000 | ORAL_TABLET | ORAL | Status: DC | PRN

## 2015-01-07 MED ORDER — HYDROMORPHONE HCL 1 MG/ML IJ SOLN
1.0000 mg | Freq: Once | INTRAMUSCULAR | Status: DC
Start: 2015-01-07 — End: 2015-01-07

## 2015-01-07 MED ORDER — ONDANSETRON HCL 4 MG/2ML IJ SOLN
4.0000 mg | Freq: Once | INTRAMUSCULAR | Status: AC
  Administered 2015-01-07: 4 mg via INTRAVENOUS
  Filled 2015-01-07: qty 2

## 2015-01-07 MED ORDER — SODIUM CHLORIDE 0.9 % IV BOLUS (SEPSIS)
1000.0000 mL | Freq: Once | INTRAVENOUS | Status: AC
  Administered 2015-01-07: 1000 mL via INTRAVENOUS

## 2015-01-07 MED ORDER — MORPHINE SULFATE (PF) 4 MG/ML IV SOLN
4.0000 mg | Freq: Once | INTRAVENOUS | Status: AC
  Administered 2015-01-07: 4 mg via INTRAVENOUS
  Filled 2015-01-07: qty 1

## 2015-01-07 MED ORDER — GI COCKTAIL ~~LOC~~
30.0000 mL | Freq: Once | ORAL | Status: AC
  Administered 2015-01-07: 30 mL via ORAL
  Filled 2015-01-07: qty 30

## 2015-01-07 MED ORDER — ONDANSETRON HCL 4 MG PO TABS: 4.0000 mg | ORAL_TABLET | Freq: Four times a day (QID) | ORAL | Status: DC

## 2015-01-07 NOTE — ED Notes (Signed)
Provider at bedside

## 2015-01-07 NOTE — ED Notes (Signed)
Pt here for RUQ pain radiating into back since last night. sts N,V,D.

## 2015-01-07 NOTE — Discharge Instructions (Signed)
Abdominal Pain Follow up with a primary care physician using the resource guide below. Return for inability to tolerate fluids by mouth or increased abdominal pain. Many things can cause abdominal pain. Usually, abdominal pain is not caused by a disease and will improve without treatment. It can often be observed and treated at home. Your health care provider will do a physical exam and possibly order blood tests and X-rays to help determine the seriousness of your pain. However, in many cases, more time must pass before a clear cause of the pain can be found. Before that point, your health care provider may not know if you need more testing or further treatment. HOME CARE INSTRUCTIONS  Monitor your abdominal pain for any changes. The following actions may help to alleviate any discomfort you are experiencing:  Only take over-the-counter or prescription medicines as directed by your health care provider.  Do not take laxatives unless directed to do so by your health care provider.  Try a clear liquid diet (broth, tea, or water) as directed by your health care provider. Slowly move to a bland diet as tolerated. SEEK MEDICAL CARE IF:  You have unexplained abdominal pain.  You have abdominal pain associated with nausea or diarrhea.  You have pain when you urinate or have a bowel movement.  You experience abdominal pain that wakes you in the night.  You have abdominal pain that is worsened or improved by eating food.  You have abdominal pain that is worsened with eating fatty foods.  You have a fever. SEEK IMMEDIATE MEDICAL CARE IF:   Your pain does not go away within 2 hours.  You keep throwing up (vomiting).  Your pain is felt only in portions of the abdomen, such as the right side or the left lower portion of the abdomen.  You pass bloody or black tarry stools. MAKE SURE YOU:  Understand these instructions.   Will watch your condition.   Will get help right away if you are  not doing well or get worse.  Document Released: 01/07/2005 Document Revised: 04/04/2013 Document Reviewed: 12/07/2012 [facility] Patient Information 2015 [place], [place]. This information is not intended to replace advice given to you by your health care provider. Make sure you discuss any questions you have with your health care provider.  Emergency Department Resource Guide 1) Find a Doctor and Pay Out of Pocket Although you won't have to find out who is covered by your insurance plan, it is a good idea to ask around and get recommendations. You will then need to call the office and see if the doctor you have chosen will accept you as a new patient and what types of options they offer for patients who are self-pay. Some doctors offer discounts or will set up payment plans for their patients who do not have insurance, but you will need to ask so you aren't surprised when you get to your appointment.  2) Contact Your Local Health Department Not all health departments have doctors that can see patients for sick visits, but many do, so it is worth a call to see if yours does. If you don't know where your local health department is, you can check in your phone book. The CDC also has a tool to help you locate your state's health department, and many state websites also have listings of all of their local health departments.  3) Find a [facility] If your illness is not likely to be very severe or complicated, you  may want to try a walk in clinic. These are popping up all over the country in pharmacies, drugstores, and shopping centers. They're usually staffed by nurse practitioners or physician assistants that have been trained to treat common illnesses and complaints. They're usually fairly quick and inexpensive. However, if you have serious medical issues or chronic medical problems, these are probably not your best option.  No Primary Care Doctor: - Call Health Connect at  [phone] - they can help  you locate a primary care doctor that  accepts your insurance, provides certain services, etc. - Physician Referral Service- [phone]  Chronic Pain Problems: Organization         Address  Phone   Notes  [facility]  [phone] Patients need to be referred by their primary care doctor.   Medication Assistance: Organization         Address  Phone   Notes  [facility] Medication [facility] [address] [place], [place] [address] [phone] --Must be a resident of [facility] -- Must have NO insurance coverage whatsoever (no Medicaid/ Medicare, etc.) -- The pt. MUST have a primary care doctor that directs their care regularly and follows them in the community   MedAssist  [phone]   [employer]  [phone]    Agencies that provide inexpensive medical care: Organization         Address  Phone   Notes  [place]  [phone]   [facility]    [phone]   [facility] [place], [place] [address] [phone]   [place] [address], [place] [phone]   Planned Parenthood    [phone]   [facility]    [phone]   [place] [address], [place] Phone:  [phone], Fax:  [phone] Hours of Operation:  9 am - 6 pm, M-F.  Also accepts Medicaid/Medicare and self-pay.  [facility] for [address], [address], [place] Phone: [phone], Fax: [phone]. Hours of Operation:  8:30 am - 5:30 pm, M-F.  Also accepts Medicaid and self-pay.  [facility] [place] [address], [place] Phone: [phone]   [place], [place], [place] [phone], Ext. [phone] Mondays & Thursdays: 7-9 AM.  First 15 patients are seen on a first come, first serve basis.    [place] Providers:  Organization         Address  Phone   Notes  [facility] [address], [place] [phone] Also accepts self-pay patients.  [facility] [address], [place]  [phone]   [place], [address], [place] [phone]   [facility] [address] [place] [phone]   Lucianne Lei [address], [place]   [phone] Only accepts [place] Access [place] patients after they have their name applied to their card.   Self-Pay (no insurance) in [facility]:  Organization         Address  Phone   Notes  Sickle Cell Patients, [facility] [place] [phone]   [facility] [place] (  [facility] [place]  [place], [address], [place] [phone]   [facility]/Dr. Osei-Bonsu  [address], [place] or [address], [place] [phone] Phone number for both [place] and [place] locations is the same.  [facility] [address], [place] [phone]   [facility] [address] [place] or [address] [phone]   [facility] [address] [place] [phone], phone; [phone], fax Sees patients 1st and 3rd Saturday of every month.  Must not qualify for public or private insurance (i.e. Medicaid, Medicare, [place] Health Choice, Veterans' Benefits)  Household income should be no more than 200% of the poverty level The clinic cannot treat you if you are pregnant or think you are pregnant  Sexually transmitted diseases are not treated at the clinic.    Dental Care: Organization         Address  Phone  Notes  [facility] Department of [facility] [place] [phone] Accepts children up to age 30 who are enrolled in [place] or [place]; pregnant women with a Medicaid card; and children who have applied for Medicaid or [place] Health Choice, but were declined, whose parents can pay a reduced fee at time of service.  [facility] Department of [facility]  [address], [place] [phone] Accepts children up to age 47 who are enrolled in [place] or [place]; pregnant women with a Medicaid card; and children who have applied for Medicaid or [place] Health Choice, but were declined, whose parents can pay a reduced fee at time of service.  [place] Adult Dental Access PROGRAM  [place] [phone] Patients are seen by appointment only. Walk-ins are not accepted. [place] will see patients 69 years of age and older. Monday - Tuesday (8am-5pm) Most Wednesdays (8:30-5pm) $30 per visit, cash only  [facility] Adult Dental Access PROGRAM  [address], [facility] [phone] Patients are seen by appointment only. Walk-ins are not accepted. [place] will see patients 75 years of age and older. One Wednesday Evening (Monthly: Volunteer Based).  $30 per visit, cash only  [place]  [phone] for adults; Children under age 11, call Graduate Pediatric Dentistry at [phone]. Children aged 61-14, please call [phone] to request a pediatric application.  Dental services are provided in all areas of dental care including fillings, crowns and bridges, complete and partial dentures, implants, gum treatment, root canals, and extractions. Preventive care is also provided. Treatment is provided to both adults and children. Patients are selected via a lottery and there is often a waiting list.   [facility] [address] [place]  [phone] [email or web address]   [facility] [address] [place], [place] [phone], Ext. [phone] Second and Fourth Thursday of each month, opens at 6:30 AM; Clinic ends at 9 AM.  Patients are seen on a first-come first-served basis, and a limited number are seen during each clinic.   [facility]  [address] Hillard Danker [place], [place] [phone]   Eligibility Requirements You must have lived in [place], [place], or [place] counties for at least the last three months.   You cannot be eligible for  state or Museum/gallery conservator, including [employer], [place], or [employer].   You generally cannot be eligible for healthcare insurance through your employer.    How to apply: Eligibility screenings are held every Tuesday and Wednesday afternoon from 1:00 pm until 4:00 pm. You do not need an appointment for the interview!  [facility] [address] [place], [place]   [place]  [place] Department  [place]  [phone]    Behavioral Health Resources in the Community: Intensive Outpatient Programs Organization         Address  Phone  Notes  [place]. [address] [place], [place] [phone]   [facility] Outpatient [address], [place], [place]   ADS: Alcohol & Drug Svcs [address], [place], [place]   [place] [address]  [place], [place] or [phone]   Substance Abuse Resources Organization         Address  Phone  Notes  Alcohol and Drug Services  [phone]   [place]  [phone]   [place]   Chinita Pester  [phone]   Residential & Outpatient Substance Abuse Program  [phone]   Psychological Services Organization         Address  Phone  Notes  [facility]  [place]  [phone]   [place] [address] [place] or [phone]    Mobile Crisis Teams Organization         Address  Phone  Notes  [facility], Mobile Crisis Care Unit  [phone]   [facility] Psychotherapeutic Services  [address] [place], [place]   [place] Levels [address], [place] [phone]    Self-Help/Support Groups Organization         Address  Phone             Notes  [place]. of [place] - variety of support groups  [place] Call for more information  Narcotics Anonymous (NA), [facility]aring [facility] [address], [employer] [place]  2 meetings at this location   Special educational needs teacher         Address  Phone  Notes  [facility]idential Treatment [place],    [place]  [phone]   [facility]  [address], [place] [address], [place], [place]   [place], [place] [phone] Admissions: 8am-3pm M-F  Incentives Substance [place] [address],    [place], [place] [phone]   [facility] [address] Jadene Pierini [place], [place]   [facility] [address]  [place], [place]   [facility] - Intensive Outpatient [address], [place], [place]   [facility] [place].,  [place], [place] [phone] or [phone]   [facility] Services (RTS) [address] [place], [place] Accepts Medicaid  [facility] [place] [address],  [place] [phone] Substance Abuse/Addiction Treatment   [facility] Organization         Address  Phone  Notes  [facility]  [phone]   Domenic Schwab, PhD [place], [address] [place], [place]   [phone] or  [phone]) [phone]   [facility]   [address] [place], [place] [phone]   [place] [address], [place], [place] [phone] Insurance/Medicaid/sponsorship through [facility] and Families [address], [address]                                    [place], [place] [phone] [place] [address]   [place], [place] [phone]    Dr. Adele Schilder  [phone]   [facility] Dept. 1) [address] [place] 2) [place] 3)  [place] [address], [place] [phone]  [phone]   [place] [phone] or [phone] (After Hours)

## 2015-01-07 NOTE — ED Notes (Signed)
Pt alert x4 respirations easy non labored.  

## 2015-01-07 NOTE — ED Provider Notes (Signed)
CSN: [id number]     Arrival date & time 01/07/15  0809 History   First MD Initiated Contact with Patient 01/07/15 1002     Chief Complaint  Patient presents with  . Abdominal Pain     (Consider location/radiation/quality/duration/timing/severity/associated sxs/prior Treatment) Patient is a 29 y.o. female presenting with abdominal pain. The history is provided by the patient. No language interpreter was used.  Abdominal Pain Associated symptoms: diarrhea, nausea and vomiting   Associated symptoms: no chills, no dysuria, no fever and no hematuria   [name] is a 29 year old female with a history of pelvic inflammatory disease, endometriosis, anxiety, and kidney stones who presents for right upper quadrant and epigastric abdominal pain that began around 9 PM last night. She describes it as intermittent stabbing pain that radiates between her scapula. She also mentioned that she had bilious vomiting 1 yesterday but without blood.  She also had diarrhea yesterday. She denies any fever, chills, chest pain, shortness of breath, constipation, hematochezia, dysuria, hematuria, urinary frequency, vaginal discharge or odor, vaginal bleeding. She denies any recent alcohol use. Status post hysterectomy.  Past Medical History  Diagnosis Date  . Endometriosis   . History of pelvic inflammatory disease   . History of genital warts   . Pelvic pain in female   . Mood disorder     UNSPECIFIED  . History of asthma     with bronchitis  . SVD (spontaneous vaginal delivery)   . Depression   . Anxiety   . History of kidney stones     passed stones, no surgery required   Past Surgical History  Procedure Laterality Date  . Ablation colpoclesis    . Laparoscopy N/A 08/18/2013    Procedure: LAPAROSCOPY DIAGNOSTIC, FULGERATION OF ENDOMETROSIS;  Surgeon: Cheri Fowler, MD;  Location: [facility];  Service: Gynecology;  Laterality: N/A;  . Dx laparoscopy/ aspiration right ovarian cyst and bx posterior  cul-de-sac  05-27-2005  . Essure tubal ligation Bilateral 2011  . Cysto with hydrodistension N/A 12/01/2013    Procedure: CYSTOSCOPY/HYDRODISTENSION with instillation of marcaine and pyridium;  Surgeon: Ardis Hughs, MD;  [facility];  Service: Urology;  Laterality: N/A;  . Wisdom tooth extraction    . Laparoscopic assisted vaginal hysterectomy N/A 06/25/2014    Procedure: LAPAROSCOPIC ASSISTED VAGINAL HYSTERECTOMY;  Surgeon: Cheri Fowler, MD;  Location: [facility];  Service: Gynecology;  Laterality: N/A;  . Bilateral salpingectomy Bilateral 06/25/2014    Procedure: BILATERAL SALPINGECTOMY;  Surgeon: Cheri Fowler, MD;  Location: [facility];  Service: Gynecology;  Laterality: Bilateral;  . Cystoscopy N/A 06/25/2014    Procedure: CYSTOSCOPY;  Surgeon: Cheri Fowler, MD;  Location: [facility];  Service: Gynecology;  Laterality: N/A;   History reviewed. No pertinent family history. Social History  Substance Use Topics  . Smoking status: Former Smoker -- 0.50 packs/day for 4 years    Types: Cigarettes    Quit date: 11/27/2009  . Smokeless tobacco: Never Used  . Alcohol Use: No   OB History    Gravida Para Term Preterm AB TAB SAB Ectopic Multiple Living   2 2 2       2      Review of Systems  Constitutional: Negative for fever and chills.  Gastrointestinal: Positive for nausea, vomiting, abdominal pain and diarrhea. Negative for blood in stool.  Genitourinary: Negative for dysuria, hematuria, flank pain and difficulty urinating.  All other systems reviewed and are negative.     Allergies  Biaxin; Penicillins; and Toradol  Home Medications   Prior to Admission medications   Medication Sig Start Date End Date Taking? Authorizing Provider  buPROPion (WELLBUTRIN) 100 MG tablet Take 100 mg by mouth 2 (two) times daily.   Yes Historical Provider, MD  ibuprofen (ADVIL,MOTRIN) 600 MG tablet Take 1 tablet (600 mg total) by mouth every 6 (six) hours as needed (pain).  06/25/14  Yes Cheri Fowler, MD  HYDROcodone-acetaminophen (NORCO/VICODIN) 5-325 MG per tablet Take 2 tablets by mouth every 4 (four) hours as needed. 01/07/15   Nelta Caudill Patel-Mills, PA-C  ondansetron (ZOFRAN) 4 MG tablet Take 1 tablet (4 mg total) by mouth every 6 (six) hours. 01/07/15   Betzabe Bevans Patel-Mills, PA-C   BP 105/41 mmHg  Pulse 85  Temp(Src) 98.3 F (36.8 C) (Oral)  Resp 22  SpO2 99%  LMP 06/06/2014 (Approximate) Physical Exam  Constitutional: She is oriented to person, place, and time. She appears well-developed and well-nourished.  HENT:  Head: Normocephalic and atraumatic.  Eyes: Conjunctivae are normal.  Neck: Normal range of motion. Neck supple.  Cardiovascular: Normal rate, regular rhythm and normal heart sounds.   Pulmonary/Chest: Effort normal and breath sounds normal.  Abdominal: Soft. She exhibits no distension. There is tenderness in the right upper quadrant and epigastric area. There is no rebound, no guarding and no CVA tenderness.  Tenderness to palpation of the right upper quadrant and epigastrium. No guarding or rebound. Abdomen is soft and nondistended. No CVA tenderness bilaterally.  Striae along the lower abdomen.  Musculoskeletal: Normal range of motion.  Neurological: She is alert and oriented to person, place, and time.  Skin: Skin is warm and dry.  Nursing note and vitals reviewed.   ED Course  Procedures (including critical care time) Labs Review Labs Reviewed  LIPASE, BLOOD - Abnormal; Notable for the following:    Lipase 20 (*)    All other components within normal limits  COMPREHENSIVE METABOLIC PANEL - Abnormal; Notable for the following:    Glucose, Bld 103 (*)    Creatinine, Ser 1.03 (*)    All other components within normal limits  URINALYSIS, ROUTINE W REFLEX MICROSCOPIC (NOT AT [facility]) - Abnormal; Notable for the following:    APPearance CLOUDY (*)    Ketones, ur 15 (*)    All other components within normal limits  CBC    Imaging  Review US Abdomen Limited  01/07/2015   CLINICAL DATA:  Right upper quadrant pain since last night  EXAM: US ABDOMEN LIMITED - RIGHT UPPER QUADRANT  COMPARISON:  None.  FINDINGS: Gallbladder:  No gallstones or wall thickening visualized. No sonographic Murphy sign noted.  Common bile duct:  Diameter: 6.1 mm  Liver:  No focal lesion identified. Within normal limits in parenchymal echogenicity.  IMPRESSION: Normal right upper quadrant ultrasound.   Electronically Signed   By: Kathreen Devoid   On: 01/07/2015 12:16     EKG Interpretation None      MDM   Final diagnoses:  Nausea vomiting and diarrhea  Epigastric abdominal pain  She presents for right upper quadrant and epigastric abdominal pain that radiates to her scapula and N/V/D since yesterday. I doubt this is an ulcer or pancreatitis. Due to the reproducible nature of her pain and ultrasound of the gallbladder was obtained which is negative for cholecystitis or cholelithiasis. Her symptoms are consistent with gastroenteritis.  Medications  morphine 4 MG/ML injection 4 mg (4 mg Intravenous Given 01/07/15 1044)  ondansetron (ZOFRAN) injection 4 mg (4 mg Intravenous Given 01/07/15 1043)  sodium chloride 0.9 % bolus 1,000 mL (1,000 mLs Intravenous New Bag/Given 01/07/15 1043)  gi cocktail (Maalox,Lidocaine,Donnatal) (30 mLs Oral Given 01/07/15 1251)  I discussed eating a bland diet for 48 hours and drinking plenty of fluids. I also discussed return precautions as well as follow-up and she verbally agrees with the plan. Rx: zofran, norco Filed Vitals:   01/07/15 1133  BP: 105/41  Pulse: 85  Temp:   Resp: [address] PA-C 01/07/15 1929  Blanchie Dessert, MD 01/09/15 1315

## 2015-01-07 NOTE — ED Notes (Signed)
Patient stated does not need to urinate at this time.

## 2015-02-12 ENCOUNTER — Emergency Department (HOSPITAL_COMMUNITY): Payer: Medicaid Other

## 2015-02-12 ENCOUNTER — Emergency Department (HOSPITAL_COMMUNITY)
Admission: EM | Admit: 2015-02-12 | Discharge: 2015-02-12 | Disposition: A | Discharge status: Home / Self Care | Payer: Medicaid Other | Attending: Emergency Medicine | Admitting: Emergency Medicine

## 2015-02-12 ENCOUNTER — Encounter (HOSPITAL_COMMUNITY): Payer: Self-pay | Admitting: *Deleted

## 2015-02-12 DIAGNOSIS — Y998 Other external cause status: Secondary | ICD-10-CM | POA: Diagnosis not present

## 2015-02-12 DIAGNOSIS — Y9389 Activity, other specified: Secondary | ICD-10-CM | POA: Diagnosis not present

## 2015-02-12 DIAGNOSIS — Z87891 Personal history of nicotine dependence: Secondary | ICD-10-CM | POA: Insufficient documentation

## 2015-02-12 DIAGNOSIS — Z880 Allergy status to penicillin: Secondary | ICD-10-CM | POA: Insufficient documentation

## 2015-02-12 DIAGNOSIS — J45909 Unspecified asthma, uncomplicated: Secondary | ICD-10-CM | POA: Diagnosis not present

## 2015-02-12 DIAGNOSIS — W172XXA Fall into hole, initial encounter: Secondary | ICD-10-CM | POA: Diagnosis not present

## 2015-02-12 DIAGNOSIS — F419 Anxiety disorder, unspecified: Secondary | ICD-10-CM | POA: Insufficient documentation

## 2015-02-12 DIAGNOSIS — Z8742 Personal history of other diseases of the female genital tract: Secondary | ICD-10-CM | POA: Diagnosis not present

## 2015-02-12 DIAGNOSIS — S93402A Sprain of unspecified ligament of left ankle, initial encounter: Secondary | ICD-10-CM | POA: Diagnosis not present

## 2015-02-12 DIAGNOSIS — Z79899 Other long term (current) drug therapy: Secondary | ICD-10-CM | POA: Diagnosis not present

## 2015-02-12 DIAGNOSIS — F329 Major depressive disorder, single episode, unspecified: Secondary | ICD-10-CM | POA: Diagnosis not present

## 2015-02-12 DIAGNOSIS — Z8619 Personal history of other infectious and parasitic diseases: Secondary | ICD-10-CM | POA: Diagnosis not present

## 2015-02-12 DIAGNOSIS — Z87442 Personal history of urinary calculi: Secondary | ICD-10-CM | POA: Insufficient documentation

## 2015-02-12 DIAGNOSIS — Y9289 Other specified places as the place of occurrence of the external cause: Secondary | ICD-10-CM | POA: Insufficient documentation

## 2015-02-12 DIAGNOSIS — S99912A Unspecified injury of left ankle, initial encounter: Secondary | ICD-10-CM | POA: Diagnosis present

## 2015-02-12 MED ORDER — ONDANSETRON 4 MG PO TBDP
4.0000 mg | ORAL_TABLET | Freq: Once | ORAL | Status: AC
  Administered 2015-02-12: 4 mg via ORAL
  Filled 2015-02-12: qty 1

## 2015-02-12 MED ORDER — HYDROCODONE-ACETAMINOPHEN 5-325 MG PO TABS: 1.0000 | ORAL_TABLET | Freq: Four times a day (QID) | ORAL | Status: DC | PRN

## 2015-02-12 MED ORDER — OXYCODONE-ACETAMINOPHEN 5-325 MG PO TABS
1.0000 | ORAL_TABLET | Freq: Once | ORAL | Status: AC
  Administered 2015-02-12: 1 via ORAL
  Filled 2015-02-12: qty 1

## 2015-02-12 NOTE — ED Notes (Signed)
PT reports she stepped in a hole last night while out with childern for trick-or -treat. Pt also reports falling in the AM when getting up to go to bathroom . Lt ankle red and swollen . Pt moves all toes.

## 2015-02-12 NOTE — ED Provider Notes (Signed)
CSN: [id number]     Arrival date & time 02/12/15  0736 History   First MD Initiated Contact with Patient 02/12/15 [phone]     Chief Complaint  Patient presents with  . Ankle Pain  . Fall     (Consider location/radiation/quality/duration/timing/severity/associated sxs/prior Treatment) HPI Comments: Patient presents to the emergency department with chief complaint left ankle pain. She states that she was out trick or treating with her kids last night, when she stepped in a hole, rolling her ankle, and falling to the ground. She states that she was able to ambulate home. States that she has had persistent pain through the evening. She has tried gentle stretching as well as taking a bath with no relief. Her symptoms are aggravated with movement and palpation. She denies any other injuries.  The history is provided by the patient. No language interpreter was used.    Past Medical History  Diagnosis Date  . Endometriosis   . History of pelvic inflammatory disease   . History of genital warts   . Pelvic pain in female   . Mood disorder (HCC)     UNSPECIFIED  . History of asthma     with bronchitis  . SVD (spontaneous vaginal delivery)   . Depression   . Anxiety   . History of kidney stones     passed stones, no surgery required  . Asthma    Past Surgical History  Procedure Laterality Date  . Ablation colpoclesis    . Laparoscopy N/A 08/18/2013    Procedure: LAPAROSCOPY DIAGNOSTIC, FULGERATION OF ENDOMETROSIS;  Surgeon: Cheri Fowler, MD;  Location: [facility];  Service: Gynecology;  Laterality: N/A;  . Dx laparoscopy/ aspiration right ovarian cyst and bx posterior cul-de-sac  05-27-2005  . Essure tubal ligation Bilateral 2011  . Cysto with hydrodistension N/A 12/01/2013    Procedure: CYSTOSCOPY/HYDRODISTENSION with instillation of marcaine and pyridium;  Surgeon: Ardis Hughs, MD;  Location: [facility];  Service: Urology;  Laterality: N/A;  . Wisdom tooth extraction     . Laparoscopic assisted vaginal hysterectomy N/A 06/25/2014    Procedure: LAPAROSCOPIC ASSISTED VAGINAL HYSTERECTOMY;  Surgeon: Cheri Fowler, MD;  Location: [facility];  Service: Gynecology;  Laterality: N/A;  . Bilateral salpingectomy Bilateral 06/25/2014    Procedure: BILATERAL SALPINGECTOMY;  Surgeon: Cheri Fowler, MD;  Location: [facility];  Service: Gynecology;  Laterality: Bilateral;  . Cystoscopy N/A 06/25/2014    Procedure: CYSTOSCOPY;  Surgeon: Cheri Fowler, MD;  Location: [facility];  Service: Gynecology;  Laterality: N/A;   History reviewed. No pertinent family history. Social History  Substance Use Topics  . Smoking status: Former Smoker -- 0.50 packs/day for 4 years    Types: Cigarettes    Quit date: 11/27/2009  . Smokeless tobacco: Never Used  . Alcohol Use: No   OB History    Gravida Para Term Preterm AB TAB SAB Ectopic Multiple Living   2 2 2       2      Review of Systems  Constitutional: Negative for fever and chills.  Respiratory: Negative for shortness of breath.   Cardiovascular: Negative for chest pain.  Gastrointestinal: Negative for nausea, vomiting, diarrhea and constipation.  Genitourinary: Negative for dysuria.  Musculoskeletal: Positive for joint swelling and arthralgias.  All other systems reviewed and are negative.     Allergies  Biaxin; Penicillins; and Toradol  Home Medications   Prior to Admission medications   Medication Sig Start Date End Date Taking? Authorizing Provider  buPROPion [facility])  100 MG tablet Take 100 mg by mouth 2 (two) times daily.    Historical Provider, MD  HYDROcodone-acetaminophen (NORCO/VICODIN) 5-325 MG per tablet Take 2 tablets by mouth every 4 (four) hours as needed. 01/07/15   Hanna Patel-Mills, PA-C  ibuprofen (ADVIL,MOTRIN) 600 MG tablet Take 1 tablet (600 mg total) by mouth every 6 (six) hours as needed (pain). 06/25/14   Cheri Fowler, MD  ondansetron (ZOFRAN) 4 MG tablet Take 1 tablet (4 mg total) by mouth every 6  (six) hours. 01/07/15   Hanna Patel-Mills, PA-C   BP 143/93 mmHg  Pulse 100  Temp(Src) 98 F (36.7 C) (Oral)  Resp 18  Ht 5\' 3"  (1.6 m)  Wt 187 lb (84.823 kg)  BMI 33.13 kg/m2  SpO2 99%  LMP 06/06/2014 (Approximate) Physical Exam  Constitutional: She is oriented to person, place, and time. She appears well-developed and well-nourished.  HENT:  Head: Normocephalic and atraumatic.  Eyes: Conjunctivae and EOM are normal.  Neck: Normal range of motion.  Cardiovascular: Normal rate and intact distal pulses.   Intact distal pulses with brisk capillary refill  Pulmonary/Chest: Effort normal.  Abdominal: She exhibits no distension.  Musculoskeletal: Normal range of motion.  Left ankle tender to palpation over the ATFL, and CFL, no bony abnormality or deformity, moderate swelling laterally, range of motion and strength limited secondary to pain  Neurological: She is alert and oriented to person, place, and time.  Sensation intact  Skin: Skin is dry.  Psychiatric: She has a normal mood and affect. Her behavior is normal. Judgment and thought content normal.  Nursing note and vitals reviewed.   ED Course  Procedures (including critical care time)  Imaging Review Dg Ankle Complete Left  02/12/2015  CLINICAL DATA:  Pain and swelling of the left ankle secondary to a twisting injury and fall last night. EXAM: LEFT ANKLE COMPLETE - 3+ VIEW COMPARISON:  09/05/2006 FINDINGS: There is no fracture or dislocation. There is an ankle joint effusion with prominent soft tissue swelling laterally. IMPRESSION: Effusion and soft tissue swelling.  No osseous abnormality. Electronically Signed   By: Lorriane Shire M.D.   On: 02/12/2015 08:22   I have personally reviewed and evaluated these images and lab results as part of my medical decision-making.    MDM   Final diagnoses:  Ankle sprain, left, initial encounter    Patient with left ankle pain. Plain films are negative for fracture, but she does  have an effusion. Suspicion for sprain. Will recommend orthopedic follow-up. Will give ankle ASO, and crutches and pain medicine. Follow-up instructions given. Non-weightbearing until ortho follow-up or pain free. Patient understands and agrees the plan. She is stable and ready for discharge.   Montine Circle, PA-C 02/12/15 9450  Davonna Belling, MD 02/13/15 [phone]

## 2015-02-12 NOTE — Discharge Instructions (Signed)
Ankle Sprain  An ankle sprain is an injury to the strong, fibrous tissues (ligaments) that hold the bones of your ankle joint together.   CAUSES  An ankle sprain is usually caused by a fall or by twisting your ankle. Ankle sprains most commonly occur when you step on the outer edge of your foot, and your ankle turns inward. People who participate in sports are more prone to these types of injuries.   SYMPTOMS    Pain in your ankle. The pain may be present at rest or only when you are trying to stand or walk.   Swelling.   Bruising. Bruising may develop immediately or within 1 to 2 days after your injury.   Difficulty standing or walking, particularly when turning corners or changing directions.  DIAGNOSIS   Your caregiver will ask you details about your injury and perform a physical exam of your ankle to determine if you have an ankle sprain. During the physical exam, your caregiver will press on and apply pressure to specific areas of your foot and ankle. Your caregiver will try to move your ankle in certain ways. An X-ray exam may be done to be sure a bone was not broken or a ligament did not separate from one of the bones in your ankle (avulsion fracture).   TREATMENT   Certain types of braces can help stabilize your ankle. Your caregiver can make a recommendation for this. Your caregiver may recommend the use of medicine for pain. If your sprain is severe, your caregiver may refer you to a surgeon who helps to restore function to parts of your skeletal system (orthopedist) or a physical therapist.  HOME CARE INSTRUCTIONS    Apply ice to your injury for 1-2 days or as directed by your caregiver. Applying ice helps to reduce inflammation and pain.    Put ice in a plastic bag.    Place a towel between your skin and the bag.    Leave the ice on for 15-20 minutes at a time, every 2 hours while you are awake.   Only take over-the-counter or prescription medicines for pain, discomfort, or fever as directed by  your caregiver.   Elevate your injured ankle above the level of your heart as much as possible for 2-3 days.   If your caregiver recommends crutches, use them as instructed. Gradually put weight on the affected ankle. Continue to use crutches or a cane until you can walk without feeling pain in your ankle.   If you have a plaster splint, wear the splint as directed by your caregiver. Do not rest it on anything harder than a pillow for the first 24 hours. Do not put weight on it. Do not get it wet. You may take it off to take a shower or bath.   You may have been given an elastic bandage to wear around your ankle to provide support. If the elastic bandage is too tight (you have numbness or tingling in your foot or your foot becomes cold and blue), adjust the bandage to make it comfortable.   If you have an air splint, you may blow more air into it or let air out to make it more comfortable. You may take your splint off at night and before taking a shower or bath. Wiggle your toes in the splint several times per day to decrease swelling.  SEEK MEDICAL CARE IF:    You have rapidly increasing bruising or swelling.   Your toes feel   extremely cold or you lose feeling in your foot.   Your pain is not relieved with medicine.  SEEK IMMEDIATE MEDICAL CARE IF:   Your toes are numb or blue.   You have severe pain that is increasing.  MAKE SURE YOU:    Understand these instructions.   Will watch your condition.   Will get help right away if you are not doing well or get worse.     This information is not intended to replace advice given to you by your health care provider. Make sure you discuss any questions you have with your health care provider.     Document Released: 03/30/2005 Document Revised: 04/20/2014 Document Reviewed: 04/11/2011  Elsevier Interactive Patient Education 2016 [employer]

## 2015-02-16 ENCOUNTER — Encounter (HOSPITAL_COMMUNITY): Payer: Self-pay | Admitting: *Deleted

## 2015-02-16 ENCOUNTER — Emergency Department (HOSPITAL_COMMUNITY)
Admission: EM | Admit: 2015-02-16 | Discharge: 2015-02-16 | Disposition: A | Discharge status: Home / Self Care | Payer: Medicaid Other | Attending: Emergency Medicine | Admitting: Emergency Medicine

## 2015-02-16 ENCOUNTER — Emergency Department (HOSPITAL_COMMUNITY): Payer: Medicaid Other

## 2015-02-16 DIAGNOSIS — F329 Major depressive disorder, single episode, unspecified: Secondary | ICD-10-CM | POA: Diagnosis not present

## 2015-02-16 DIAGNOSIS — Z87442 Personal history of urinary calculi: Secondary | ICD-10-CM | POA: Insufficient documentation

## 2015-02-16 DIAGNOSIS — Z79899 Other long term (current) drug therapy: Secondary | ICD-10-CM | POA: Insufficient documentation

## 2015-02-16 DIAGNOSIS — Z87891 Personal history of nicotine dependence: Secondary | ICD-10-CM | POA: Insufficient documentation

## 2015-02-16 DIAGNOSIS — S93402D Sprain of unspecified ligament of left ankle, subsequent encounter: Secondary | ICD-10-CM

## 2015-02-16 DIAGNOSIS — Z8742 Personal history of other diseases of the female genital tract: Secondary | ICD-10-CM | POA: Diagnosis not present

## 2015-02-16 DIAGNOSIS — Z8619 Personal history of other infectious and parasitic diseases: Secondary | ICD-10-CM | POA: Diagnosis not present

## 2015-02-16 DIAGNOSIS — Z880 Allergy status to penicillin: Secondary | ICD-10-CM | POA: Insufficient documentation

## 2015-02-16 DIAGNOSIS — W1839XD Other fall on same level, subsequent encounter: Secondary | ICD-10-CM | POA: Insufficient documentation

## 2015-02-16 DIAGNOSIS — J45909 Unspecified asthma, uncomplicated: Secondary | ICD-10-CM | POA: Insufficient documentation

## 2015-02-16 DIAGNOSIS — F419 Anxiety disorder, unspecified: Secondary | ICD-10-CM | POA: Diagnosis not present

## 2015-02-16 DIAGNOSIS — M25572 Pain in left ankle and joints of left foot: Secondary | ICD-10-CM | POA: Diagnosis present

## 2015-02-16 MED ORDER — IBUPROFEN 800 MG PO TABS: 800.0000 mg | ORAL_TABLET | Freq: Three times a day (TID) | ORAL | Status: DC

## 2015-02-16 MED ORDER — HYDROCODONE-ACETAMINOPHEN 5-325 MG PO TABS
1.0000 | ORAL_TABLET | Freq: Once | ORAL | Status: AC
  Administered 2015-02-16: 1 via ORAL
  Filled 2015-02-16: qty 1

## 2015-02-16 NOTE — ED Notes (Signed)
Spoke with patient regarding discharge prescription. Pt is requesting medication stronger than ibuprofen, specifically percocet or vicodin. Spoke with Jarrett Soho PA who states patient received a narcotic prescription for this injury a few days ago and we would not be prescribing any further narcotics for her ankle sprain. Pt was given pain medication dose here and discharged with ibuprofen, also given crutches and cam walker to further support ankle and reduce pain. Explain RICE to patient for pain control as well. Pt verbalized understanding, discharged via wheelchair.

## 2015-02-16 NOTE — ED Notes (Signed)
Pt was here on 11/1 for left ankle injury. Unable to get ortho appt and is out of pain meds. Also fell yesterday and thinks she re-injured her ankle.

## 2015-02-16 NOTE — ED Provider Notes (Signed)
CSN: [id number]     Arrival date & time 02/16/15  0909 History  By signing my name below, I, [name], attest that this documentation has been prepared under the direction and in the presence of [employer], PA-C. Electronically Signed: Rayna Parker, ED Scribe. 02/16/2015. 9:59 AM.   Chief Complaint  Patient presents with  . Ankle Pain   The history is provided by the patient and medical records. No language interpreter was used.    HPI Comments: [name] is a 29 y.o. female who presents to the Emergency Department complaining of constant, moderate, left ankle pain with onset on 10/31. Pt was seen on 11/1 at [facility] for the same complaint further noting that she fell yesterday and believes she re-injured the affected ankle. She denies having gone to her othro appointment and notes being out of her prescribed pain medication. She notes a radiation of her pain up the affected leg and confirms having been using her crutches to ambulate. Pt notes taking ibuprofen at 6:30 am which provided some mild relief. She denies knee pain or any other associated symptoms at this time.   Past Medical History  Diagnosis Date  . Endometriosis   . History of pelvic inflammatory disease   . History of genital warts   . Pelvic pain in female   . Mood disorder (HCC)     UNSPECIFIED  . History of asthma     with bronchitis  . SVD (spontaneous vaginal delivery)   . Depression   . Anxiety   . History of kidney stones     passed stones, no surgery required  . Asthma    Past Surgical History  Procedure Laterality Date  . Ablation colpoclesis    . Laparoscopy N/A 08/18/2013    Procedure: LAPAROSCOPY DIAGNOSTIC, FULGERATION OF ENDOMETROSIS;  Surgeon: Cheri Fowler, MD;  Location: [facility];  Service: Gynecology;  Laterality: N/A;  . Dx laparoscopy/ aspiration right ovarian cyst and bx posterior cul-de-sac  05-27-2005  . Essure tubal ligation Bilateral 2011  . Cysto with hydrodistension  N/A 12/01/2013    Procedure: CYSTOSCOPY/HYDRODISTENSION with instillation of marcaine and pyridium;  Surgeon: Ardis Hughs, MD;  Location: [facility];  Service: Urology;  Laterality: N/A;  . Wisdom tooth extraction    . Laparoscopic assisted vaginal hysterectomy N/A 06/25/2014    Procedure: LAPAROSCOPIC ASSISTED VAGINAL HYSTERECTOMY;  Surgeon: Cheri Fowler, MD;  Location: [place] [facility];  Service: Gynecology;  Laterality: N/A;  . Bilateral salpingectomy Bilateral 06/25/2014    Procedure: BILATERAL SALPINGECTOMY;  Surgeon: Cheri Fowler, MD;  Location: [place] [facility];  Service: Gynecology;  Laterality: Bilateral;  . Cystoscopy N/A 06/25/2014    Procedure: CYSTOSCOPY;  Surgeon: Cheri Fowler, MD;  Location: [place] [facility];  Service: Gynecology;  Laterality: N/A;   History reviewed. No pertinent family history. Social History  Substance Use Topics  . Smoking status: Former Smoker -- 0.50 packs/day for 4 years    Types: Cigarettes    Quit date: 11/27/2009  . Smokeless tobacco: Never Used  . Alcohol Use: No   OB History    Gravida Para Term Preterm AB TAB SAB Ectopic Multiple Living   2 2 2       2      Review of Systems  Constitutional: Negative for fever and chills.  Gastrointestinal: Negative for nausea and vomiting.  Musculoskeletal: Positive for joint swelling and arthralgias. Negative for back pain, neck pain and neck stiffness.  Skin: Negative for wound.  Neurological: Negative for  numbness.  Hematological: Does not bruise/bleed easily.  Psychiatric/Behavioral: The patient is not nervous/anxious.   All other systems reviewed and are negative.  Allergies  Biaxin; Penicillins; and Toradol  Home Medications   Prior to Admission medications   Medication Sig Start Date End Date Taking? Authorizing Provider  buPROPion (WELLBUTRIN) 100 MG tablet Take 100 mg by mouth 2 (two) times daily.    Historical Provider, MD  HYDROcodone-acetaminophen (NORCO/VICODIN) 5-325 MG tablet Take 1-2  tablets by mouth every 6 (six) hours as needed. 02/12/15   Montine Circle, PA-C  ibuprofen (ADVIL,MOTRIN) 800 MG tablet Take 1 tablet (800 mg total) by mouth 3 (three) times daily. 02/16/15   Kymberley Raz, PA-C  ondansetron (ZOFRAN) 4 MG tablet Take 1 tablet (4 mg total) by mouth every 6 (six) hours. 01/07/15   Hanna Patel-Mills, PA-C   BP 112/70 mmHg  Pulse 115  Temp(Src) 98.1 F (36.7 C) (Oral)  Resp 18  SpO2 100%  LMP 06/06/2014 (Approximate) Physical Exam  Constitutional: She appears well-developed and well-nourished. No distress.  HENT:  Head: Normocephalic and atraumatic.  Eyes: Conjunctivae are normal.  Neck: Normal range of motion.  Cardiovascular: Normal rate, regular rhythm and intact distal pulses.   Capillary refill < 3 sec  Pulmonary/Chest: Effort normal and breath sounds normal.  Musculoskeletal: She exhibits tenderness. She exhibits no edema.  ROM: significantly decreased ROM due to pain and poor effort ecchymosis and swelling circumferentially around ankle and extending into forefoot; FROM of toes  Neurological: She is alert. Coordination normal.  Sensation  Strength 4/5 due to pain and poor effort  Skin: Skin is warm and dry. She is not diaphoretic.  No tenting of the skin  Psychiatric: She has a normal mood and affect.  Nursing note and vitals reviewed.  ED Course  Procedures  DIAGNOSTIC STUDIES: Oxygen Saturation is 100% on RA, normal by my interpretation.    COORDINATION OF CARE: 9:54 AM Pt presents today due to left ankle pain. Discussed treatment plan with pt at bedside including an x-ray of the affected region and reevaluation based on results. Pt agreed to plan.  Labs Review Labs Reviewed - No data to display  Imaging Review Dg Ankle Complete Left  02/16/2015  CLINICAL DATA:  29 year old female with persistent pain and swelling about the lateral aspect of the ankle after stepping in a hole while track were treating 5 days ago. Re-injury  yesterday. EXAM: LEFT ANKLE COMPLETE - 3+ VIEW COMPARISON:  Prior radiographs 02/12/2015 FINDINGS: Diffuse soft tissue swelling circumferentially about the ankle. Positive for ankle joint effusion similar compared to the recent prior imaging. However, there is no evidence of fracture or malalignment. Normal bony mineralization. No lytic or blastic osseous lesion. IMPRESSION: Persistent ankle joint effusion with increased circumferential soft tissue swelling. No evidence of acute fracture or malalignment. Electronically Signed   By: Jacqulynn Cadet M.D.   On: 02/16/2015 10:17   I have personally reviewed and evaluated these images as part of my medical decision-making.   EKG Interpretation None      MDM   Final diagnoses:  Ankle sprain, left, subsequent encounter   [name] presents with persistent left ankle pain. Reports new injury yesterday. Also reporting that she is out of her pain medication and needs refill.  Patient X-Ray negative for obvious fracture or dislocation. Pain managed in ED. Pt advised to follow up with orthopedics if symptoms persist for possibility of missed fracture diagnosis. Patient given CAM walker while in ED, conservative therapy  recommended and discussed. Patient is to use ibuprofen or Tylenol for pain control. Do not feel that further treatment of her ankle sprain with narcotics is safe. Patient is to be non-weightbearing until she follows up with orthopedics. Patient will be dc home & is agreeable with above plan.  BP 112/70 mmHg  Pulse 115  Temp(Src) 98.1 F (36.7 C) (Oral)  Resp 18  SpO2 100%  LMP 06/06/2014 (Approximate)    [facility], PA-C 02/16/15 [place], MD 02/17/15 [phone]

## 2015-02-16 NOTE — Discharge Instructions (Signed)
1. Medications: alternate naprosyn and tylenol for pain control, usual home medications 2. Treatment: rest, ice, elevate and use brace, drink plenty of fluids, gentle stretching 3. Follow Up: Please followup with orthopedics as directed or your PCP in 1 week if no improvement for discussion of your diagnoses and further evaluation after today's visit; if you do not have a primary care doctor use the resource guide provided to find one; Please return to the ER for worsening symptoms or other concerns    Emergency Department Resource Guide 1) Find a Doctor and Pay Out of Pocket Although you won't have to find out who is covered by your insurance plan, it is a good idea to ask around and get recommendations. You will then need to call the office and see if the doctor you have chosen will accept you as a new patient and what types of options they offer for patients who are self-pay. Some doctors offer discounts or will set up payment plans for their patients who do not have insurance, but you will need to ask so you aren't surprised when you get to your appointment.  2) Contact Your Local Health Department Not all health departments have doctors that can see patients for sick visits, but many do, so it is worth a call to see if yours does. If you don't know where your local health department is, you can check in your phone book. The CDC also has a tool to help you locate your state's health department, and many state websites also have listings of all of their local health departments.  3) Find a [facility] If your illness is not likely to be very severe or complicated, you may want to try a walk in clinic. These are popping up all over the country in pharmacies, drugstores, and shopping centers. They're usually staffed by nurse practitioners or physician assistants that have been trained to treat common illnesses and complaints. They're usually fairly quick and inexpensive. However, if you have  serious medical issues or chronic medical problems, these are probably not your best option.  No Primary Care Doctor: - Call Health Connect at  [phone] - they can help you locate a primary care doctor that  accepts your insurance, provides certain services, etc. - Physician Referral Service- [phone]  Chronic Pain Problems: Organization         Address  Phone   Notes  [facility]  [phone] Patients need to be referred by their primary care doctor.   Medication Assistance: Organization         Address  Phone   Notes  [facility] Medication [facility] [address] [place], [place] [address] [phone] --Must be a resident of [facility] -- Must have NO insurance coverage whatsoever (no Medicaid/ Medicare, etc.) -- The pt. MUST have a primary care doctor that directs their care regularly and follows them in the community   [facility]ist  [phone]   [employer]  [phone]    Agencies that provide inexpensive medical care: Organization         Address  Phone   Notes  [place]  [phone]   [facility]    [phone]   [facility] [place], [place] [address] [phone]   [place] [address], [place] [phone]   Planned Parenthood    [phone]  [facility]    [phone]   [facility]  [address], [place] Phone:  [phone], Fax:  [phone] Hours of Operation:  9 am - 6 pm, M-F.  Also accepts Medicaid/Medicare and self-pay.  [facility] for [place], [address], [place] Phone: [phone], Fax: [phone]. Hours of Operation:  8:30 am - 5:30 pm, M-F.  Also accepts Medicaid and self-pay.  [facility] [place] [address] [place] Phone: [phone]   [place],  [place], [place] [phone], Ext. [phone] Mondays & Thursdays: 7-9 AM.  First 15 patients are seen on a first come, first serve basis.    Tool Providers:  Organization         Address  Phone   Notes  [facility] [address], [place] [phone] Also accepts self-pay patients.  [facility] [id number] [place], [place]  [phone]   [place], [address], [place] [phone]   [facility] [address] [place] [phone]   Lucianne Lei [address], [place]   [phone] Only accepts [place] Access [place] patients after they have their name applied to their card.   Self-Pay (no insurance) in [facility]:  Organization         Address  Phone   Notes  Sickle Cell Patients, [facility] [place] [phone]   [facility] [place] [phone]   [facility] [place]  [place], [place], [place] [phone]   [facility]/Dr. Osei-Bonsu  [address] [place] or [address], [place] [phone] Phone number for both [place] and [place] locations is the same.  [facility] [address], [place] [phone]   [facility] [address] [place] or [address] [phone]   [facility] [address], [place] [phone], phone; [phone], fax Sees patients 1st and 3rd Saturday of every month.  Must not qualify for public or private insurance (i.e. Medicaid, Medicare, [place] Health Choice, Veterans' Benefits)  Household income should be no more than 200% of the poverty level The clinic cannot treat you if you are pregnant or think you are pregnant   Sexually transmitted diseases are not treated at the clinic.    Dental Care: Organization         Address  Phone  Notes  [facility] [phone] Accepts children up to age 36 who are enrolled in [place] or [place]; pregnant women with a Medicaid card; and children who have applied for Medicaid or [place] Health Choice, but were declined, whose parents can pay a reduced fee at time of service.  [facility] Department of [facility]  [address], [place] [phone] Accepts children up to age 16 who are enrolled in [place] or [place]; pregnant women with a Medicaid card; and children who have applied for Medicaid or [place] Health Choice, but were declined, whose parents can pay a reduced fee at time  of service.  [place] [facility] Access PROGRAM  [place] [phone] Patients are seen by appointment only. Walk-ins are not accepted. [place] will see patients 21 years of age and older. Monday - Tuesday (8am-5pm) Most Wednesdays (8:30-5pm) $30 per visit, cash only  [facility] Adult Dental Access PROGRAM  [address], [facility] [phone] Patients are seen by appointment only. Walk-ins are not accepted. [place] will see patients 10 years of age and older. One Wednesday Evening (Monthly: Volunteer Based).  $30 per visit, cash only  [place]  [phone] for adults; Children under age 43, call Graduate Pediatric Dentistry at [phone]. Children aged 59-14, please call [phone] to request a pediatric application.  Dental services are provided in all areas of dental care including fillings, crowns and bridges, complete and partial dentures, implants, gum treatment, root canals, and extractions. Preventive care is also provided. Treatment is provided to both adults and children. Patients  are selected via a lottery and there is often a waiting list.   [facility] [address] [place]  [phone] [email or web address]   [facility] [address] [place], [place] [phone], Ext. [phone] Second and Fourth Thursday of each month, opens at 6:30 AM; Clinic ends at 9 AM.  Patients are seen on a first-come first-served basis, and a limited number are seen during each clinic.   [facility]  [address] Hillard Danker [place], [place] [phone]   Eligibility Requirements You must have lived in [place], [place], or [place] counties for at least the last three months.   You cannot be eligible for state or federal sponsored [employer], including [employer], [place], or [employer].   You generally cannot be eligible for healthcare insurance through your employer.    How to apply: Eligibility screenings are held every Tuesday and Wednesday afternoon from 1:00 pm until 4:00 pm. You do not need an appointment for the interview!  [facility] [address] [place], [place]   [place]  [place] Department  [place]  [phone]    Behavioral Health Resources in the Community: Intensive Outpatient Programs Organization         Address  Phone  Notes  [place]. [address] [place], [place] [phone]   [facility] [address] [place], [place]   ADS: [facility] [address] [place], [place]   [place] [address]  [place], [place] or [phone]   Substance Abuse Resources Organization         Address  Phone  Notes  Alcohol and Drug Services  [phone]   [place]  [phone]   [facility] [place]   Chinita Pester  [phone]    Residential & Outpatient Substance Abuse Program  [phone]   Psychological Services Organization         Address  Phone  Notes  [facility] [place]  [place]  [phone]   [place] [address] [place] [phone] or [phone]    Mobile Crisis Teams Organization         Address  Phone  Notes  [facility], Mobile Crisis Care Unit  [phone]   Assertive Psychotherapeutic  Services  [address] [place], [place]   [facility] [address], [place] [phone]    Self-Help/Support Groups Organization         Address  Phone             Notes  [place]. of [place] - variety of support groups  [place] Call for more information  Narcotics Anonymous (NA), [facility] [address], [employer] [place]  2 meetings at this location   Special educational needs teacher         Address  Phone  Notes  [facility] Residential Treatment [place],    [place]  [phone]   [facility]  [address] [place] [phone], [place], [place]   [place], [place] [phone] Admissions: 8am-3pm M-F  Incentives Substance [place] [address],    [place], [place] [phone]   [facility] [address] [place], [place], [place]   [facility] [address],  [place], [place]   [facility] - Intensive Outpatient [address] Kristeen [address], [place], [place]   [facility] ([place]  [place], [place] [phone] or [phone]   [facility] (RTS) [address], [place], [place] Accepts Medicaid  [facility] [place] [address] [place] [address],  [place] [phone] Substance Abuse/Addiction Treatment   [facility] Organization         Address  Phone  Notes  [facility]  [phone]   Domenic Schwab, PhD [address] Arlis Porta [place], [place]   [phone] or [phone]   [place], [place] [phone]   [facility] [address] [place], [place] [phone] Insurance/Medicaid/sponsorship through [facility] [address] [place]                                    [place], [place] [phone] [place] [address], [place] [phone]    Dr. Adele Schilder  [phone]   [facility] Dept. 1) [address] [place] 2) [place] 3)  [place] [address], [place] [phone]  [phone]   [place] [phone] or [phone] (After Hours)

## 2015-02-18 ENCOUNTER — Emergency Department (HOSPITAL_COMMUNITY)
Admission: EM | Admit: 2015-02-18 | Discharge: 2015-02-18 | Discharge status: Against Medical Advice | Payer: Medicaid Other | Attending: Emergency Medicine | Admitting: Emergency Medicine

## 2015-02-18 ENCOUNTER — Emergency Department (HOSPITAL_COMMUNITY): Payer: Medicaid Other

## 2015-02-18 ENCOUNTER — Other Ambulatory Visit (HOSPITAL_COMMUNITY): Payer: Medicaid Other

## 2015-02-18 ENCOUNTER — Encounter (HOSPITAL_COMMUNITY): Payer: Self-pay | Admitting: Emergency Medicine

## 2015-02-18 DIAGNOSIS — F419 Anxiety disorder, unspecified: Secondary | ICD-10-CM | POA: Diagnosis not present

## 2015-02-18 DIAGNOSIS — Y92002 Bathroom of unspecified non-institutional (private) residence single-family (private) house as the place of occurrence of the external cause: Secondary | ICD-10-CM | POA: Insufficient documentation

## 2015-02-18 DIAGNOSIS — J45909 Unspecified asthma, uncomplicated: Secondary | ICD-10-CM | POA: Diagnosis not present

## 2015-02-18 DIAGNOSIS — Z880 Allergy status to penicillin: Secondary | ICD-10-CM | POA: Diagnosis not present

## 2015-02-18 DIAGNOSIS — Z79899 Other long term (current) drug therapy: Secondary | ICD-10-CM | POA: Diagnosis not present

## 2015-02-18 DIAGNOSIS — S99912A Unspecified injury of left ankle, initial encounter: Secondary | ICD-10-CM | POA: Diagnosis present

## 2015-02-18 DIAGNOSIS — F329 Major depressive disorder, single episode, unspecified: Secondary | ICD-10-CM | POA: Diagnosis not present

## 2015-02-18 DIAGNOSIS — W010XXA Fall on same level from slipping, tripping and stumbling without subsequent striking against object, initial encounter: Secondary | ICD-10-CM | POA: Insufficient documentation

## 2015-02-18 DIAGNOSIS — Z8742 Personal history of other diseases of the female genital tract: Secondary | ICD-10-CM | POA: Diagnosis not present

## 2015-02-18 DIAGNOSIS — M25572 Pain in left ankle and joints of left foot: Secondary | ICD-10-CM

## 2015-02-18 DIAGNOSIS — Z87891 Personal history of nicotine dependence: Secondary | ICD-10-CM | POA: Insufficient documentation

## 2015-02-18 DIAGNOSIS — F39 Unspecified mood [affective] disorder: Secondary | ICD-10-CM | POA: Diagnosis not present

## 2015-02-18 DIAGNOSIS — Z87442 Personal history of urinary calculi: Secondary | ICD-10-CM | POA: Diagnosis not present

## 2015-02-18 DIAGNOSIS — Y93E1 Activity, personal bathing and showering: Secondary | ICD-10-CM | POA: Diagnosis not present

## 2015-02-18 DIAGNOSIS — Y998 Other external cause status: Secondary | ICD-10-CM | POA: Diagnosis not present

## 2015-02-18 MED ORDER — OXYCODONE-ACETAMINOPHEN 5-325 MG PO TABS: 1.0000 | ORAL_TABLET | Freq: Once | ORAL | Status: DC

## 2015-02-18 NOTE — ED Provider Notes (Signed)
CSN: [id number]     Arrival date & time 02/18/15  0806 History   First MD Initiated Contact with Patient 02/18/15 0815     Chief Complaint  Patient presents with  . Ankle Pain     (Consider location/radiation/quality/duration/timing/severity/associated sxs/prior Treatment) HPI [name] is a 29 y.o. female with history of chronic pelvic pain, presents to emergency department complaining of left ankle and left foot pain. Patient states she fell about a week ago and sprained her ankle. She has been seen in emergency department twice for this, had negative x-rays, was placed in a cam walker and was told to follow-up with orthopedic specialist. Patient states she is unable to follow-up with orthopedic specialist at this time because she needs a referral from primary care provider. Patient states she has an appointment with her doctor this week. She states that 2 days ago she was taking a shower and slipped and fell reinjuring her ankle. She states she is unable to walk on her leg. She reports swelling and bruising to the ankle. She is using Cam Walker and crutches and taking ibuprofen and Tylenol which is not helping her. Patient is requesting pain medications until she is able to see a specialist.  Past Medical History  Diagnosis Date  . Endometriosis   . History of pelvic inflammatory disease   . History of genital warts   . Pelvic pain in female   . Mood disorder (HCC)     UNSPECIFIED  . History of asthma     with bronchitis  . SVD (spontaneous vaginal delivery)   . Depression   . Anxiety   . History of kidney stones     passed stones, no surgery required  . Asthma    Past Surgical History  Procedure Laterality Date  . Ablation colpoclesis    . Laparoscopy N/A 08/18/2013    Procedure: LAPAROSCOPY DIAGNOSTIC, FULGERATION OF ENDOMETROSIS;  Surgeon: Cheri Fowler, MD;  Location: [facility];  Service: Gynecology;  Laterality: N/A;  . Dx laparoscopy/ aspiration right ovarian cyst and bx  posterior cul-de-sac  05-27-2005  . Ess[place] tubal ligation Bilateral 2011  . Cysto with hydrodistension N/A 12/01/2013    Procedure: CYSTOSCOPY/HYDRODISTENSION with instillation of marcaine and pyridium;  Surgeon: Ardis Hughs, MD;  [facility];  Service: Urology;  Laterality: N/A;  . Wisdom tooth extraction    . Laparoscopic assisted vaginal hysterectomy N/A 06/25/2014    Procedure: LAPAROSCOPIC ASSISTED VAGINAL HYSTERECTOMY;  Surgeon: Cheri Fowler, MD;  Location: [facility];  Service: Gynecology;  Laterality: N/A;  . Bilateral salpingectomy Bilateral 06/25/2014    Procedure: BILATERAL SALPINGECTOMY;  Surgeon: Cheri Fowler, MD;  Location: [facility];  Service: Gynecology;  Laterality: Bilateral;  . Cystoscopy N/A 06/25/2014    Procedure: CYSTOSCOPY;  Surgeon: Cheri Fowler, MD;  Location: [facility];  Service: Gynecology;  Laterality: N/A;   No family history on file. Social History  Substance Use Topics  . Smoking status: Former Smoker -- 0.50 packs/day for 4 years    Types: Cigarettes    Quit date: 11/27/2009  . Smokeless tobacco: Never Used  . Alcohol Use: No   OB History    Gravida Para Term Preterm AB TAB SAB Ectopic Multiple Living   2 2 2       2      Review of Systems  Constitutional: Negative for fever and chills.  Musculoskeletal: Positive for myalgias, joint swelling and arthralgias.  Neurological: Negative for weakness and numbness.  Allergies  Biaxin; Penicillins; and Toradol  Home Medications   Prior to Admission medications   Medication Sig Start Date End Date Taking? Authorizing Provider  buPROPion (WELLBUTRIN) 100 MG tablet Take 100 mg by mouth 2 (two) times daily.    Historical Provider, MD  HYDROcodone-acetaminophen (NORCO/VICODIN) 5-325 MG tablet Take 1-2 tablets by mouth every 6 (six) hours as needed. 02/12/15   Montine Circle, PA-C  ibuprofen (ADVIL,MOTRIN) 800 MG tablet Take 1 tablet (800 mg total) by mouth 3 (three) times  daily. 02/16/15   Hannah Muthersbaugh, PA-C  ondansetron (ZOFRAN) 4 MG tablet Take 1 tablet (4 mg total) by mouth every 6 (six) hours. 01/07/15   Hanna Patel-Mills, PA-C   BP 133/74 mmHg  Pulse 117  Temp(Src) 98.2 F (36.8 C) (Oral)  Resp 18  LMP 06/06/2014 (Approximate) Physical Exam  Constitutional: She appears well-developed and well-nourished. No distress.  Eyes: Conjunctivae are normal.  Neck: Neck supple.  Musculoskeletal:  Swelling noted to the left ankle and foot. No obvious bruising. Tender to palpation over lateral and medial malleoli. Diffuse tenderness over the foot. Patient unable to move ankle due to pain. Tenderness to palpation over left lateral knee joint. Full range of motion of the knee. Dorsal pedal pulses intact. Cap refill less than 2 seconds distally in her toes. Foot is warm and pink.  Neurological: She is alert.  Skin: Skin is warm and dry.  Nursing note and vitals reviewed.   ED Course  Procedures (including critical care time) Labs Review Labs Reviewed - No data to display  Imaging Review Dg Ankle Complete Left  02/16/2015  CLINICAL DATA:  29 year old female with persistent pain and swelling about the lateral aspect of the ankle after stepping in a hole while track were treating 5 days ago. Re-injury yesterday. EXAM: LEFT ANKLE COMPLETE - 3+ VIEW COMPARISON:  Prior radiographs 02/12/2015 FINDINGS: Diffuse soft tissue swelling circumferentially about the ankle. Positive for ankle joint effusion similar compared to the recent prior imaging. However, there is no evidence of fracture or malalignment. Normal bony mineralization. No lytic or blastic osseous lesion. IMPRESSION: Persistent ankle joint effusion with increased circumferential soft tissue swelling. No evidence of acute fracture or malalignment. Electronically Signed   By: Jacqulynn Cadet M.D.   On: 02/16/2015 10:17   I have personally reviewed and evaluated these images and lab results as part of my  medical decision-making.   EKG Interpretation None      MDM   Final diagnoses:  Ankle pain, left   8:36 AM Patient seen and examined. Patient's third visit for left ankle pain in the last week. Apparently injured it a week ago, now stating that she fell again in the shower when she was not wearing a boot and having worsening pain. Patient states she's taken ibuprofen and Tylenol for pain with no improvement in her symptoms. I have evaluated patient and placed x-rays of the ankle and knee. Patient requesting something stronger for pain. Initially ordered Percocet. I have reviewed patient's records. There is multiple charts in her record that suggests drug-seeking behavior. I looked patient up on [place] controlled substance database. Patient received regular prescription for Stadol and just received a prescription for hydrocodone on 02/12/2015 in emergency department and tramadol on 02/13/2015 from her gynecologist. I questioned patient about these prescriptions, and patient stated that she only takes pain medicine as needed for pelvic pain and painful intercourse. Have asked her why she has not taken these medications for this ankle pain and  she said it's because does not on its for. She states that she still has tramadol at home from aprescribed to her 5 days ago. I have been explained to her that I do not feel comfortable prescribing her any more pain medication since she already has Stadol and tramadol and Vicodin at home. Patient then stated that I was accusing her of drug-seeking behavior. I simply explained to her that because she already has 3 prescribed medications at home, that are all narcotics, I do not feel comfortable giving her any more controlled substances. I explained to her that we will still do x-rays and explained to her she will need to keep her ankle elevated, ice, stay off of it at home and follow-up with orthopedic specialist as she was referred. Patient then became upset  and said that she would have to call hospital administration because we are denying her treatment. Patient then said, "I'm just cannot leave and go to my dentist appointment." Patient left [place] without waiting for her x-rays.   Filed Vitals:   02/18/15 0819  BP: 133/74  Pulse: 117  Temp: 98.2 F (36.8 C)  TempSrc: Oral  Resp: [address] PA-C 02/18/15 0848  Forde Dandy, MD 02/18/15 [phone]

## 2015-02-18 NOTE — ED Notes (Signed)
Per pt, states she sprained left ankle on Halloween-states increased pain-states she fell again and re-injured it over the weekend

## 2015-02-18 NOTE — ED Notes (Signed)
Per PA, states patient left AMA because she was not given pain meds

## 2015-02-21 ENCOUNTER — Ambulatory Visit: Payer: Medicaid Other | Attending: Family Medicine | Admitting: Family Medicine

## 2015-02-21 ENCOUNTER — Encounter: Payer: Self-pay | Admitting: Family Medicine

## 2015-02-21 VITALS — BP 117/77 | HR 110 | Temp 98.3°F | Resp 16 | Ht 63.0 in | Wt 189.0 lb

## 2015-02-21 DIAGNOSIS — Z880 Allergy status to penicillin: Secondary | ICD-10-CM | POA: Insufficient documentation

## 2015-02-21 DIAGNOSIS — S93402A Sprain of unspecified ligament of left ankle, initial encounter: Secondary | ICD-10-CM | POA: Insufficient documentation

## 2015-02-21 DIAGNOSIS — Z8742 Personal history of other diseases of the female genital tract: Secondary | ICD-10-CM | POA: Insufficient documentation

## 2015-02-21 DIAGNOSIS — R102 Pelvic and perineal pain: Secondary | ICD-10-CM | POA: Insufficient documentation

## 2015-02-21 DIAGNOSIS — G8929 Other chronic pain: Secondary | ICD-10-CM | POA: Diagnosis not present

## 2015-02-21 DIAGNOSIS — M25572 Pain in left ankle and joints of left foot: Secondary | ICD-10-CM | POA: Diagnosis present

## 2015-02-21 DIAGNOSIS — W19XXXA Unspecified fall, initial encounter: Secondary | ICD-10-CM | POA: Insufficient documentation

## 2015-02-21 DIAGNOSIS — Z87891 Personal history of nicotine dependence: Secondary | ICD-10-CM | POA: Diagnosis not present

## 2015-02-21 DIAGNOSIS — F419 Anxiety disorder, unspecified: Secondary | ICD-10-CM | POA: Insufficient documentation

## 2015-02-21 DIAGNOSIS — S93409A Sprain of unspecified ligament of unspecified ankle, initial encounter: Secondary | ICD-10-CM | POA: Insufficient documentation

## 2015-02-21 MED ORDER — ACETAMINOPHEN-CODEINE #3 300-30 MG PO TABS: 1.0000 | ORAL_TABLET | Freq: Three times a day (TID) | ORAL | Status: DC | PRN

## 2015-02-21 NOTE — Progress Notes (Addendum)
Subjective:    Patient ID: [name], female    DOB: [birth year], 29 y.o.   MRN: [id number]  HPI 29 year old female with a history of anxiety, chronic pelvic pain who comes in for an ED follow-up for left ankle pain which she has had since 02/11/15 after a fall on Halloween.  She has had two x-rays of her left ankle which revealed edema of the ankle and no fracture and she has been using a cam walker, crutches and taking ibuprofen and tramadol with no relief in symptoms. Since the initial trauma she has fallen twice on the ankle and fell while getting out of the bath tub on one occasion and reports symptoms are getting worse with pain rated at 8/10 and associated edema. She is requesting a referral to orthopedics and something stronger for pain. She works as a Biomedical scientist at [employer] and has been unable to return to work.   Past Medical History  Diagnosis Date  . Endometriosis   . History of pelvic inflammatory disease   . History of genital warts   . Pelvic pain in female   . Mood disorder (HCC)     UNSPECIFIED  . History of asthma     with bronchitis  . SVD (spontaneous vaginal delivery)   . Depression   . Anxiety   . History of kidney stones     passed stones, no surgery required  . Asthma     Past Surgical History  Procedure Laterality Date  . Ablation colpoclesis    . Laparoscopy N/A 08/18/2013    Procedure: LAPAROSCOPY DIAGNOSTIC, FULGERATION OF ENDOMETROSIS;  Surgeon: Cheri Fowler, MD;  Location: [facility];  Service: Gynecology;  Laterality: N/A;  . Dx laparoscopy/ aspiration right ovarian cyst and bx posterior cul-de-sac  05-27-2005  . Essure tubal ligation Bilateral 2011  . Cysto with hydrodistension N/A 12/01/2013    Procedure: CYSTOSCOPY/HYDRODISTENSION with instillation of marcaine and pyridium;  Surgeon: Ardis Hughs, MD;  Location: [facility];  Service: Urology;  Laterality: N/A;  . Wisdom tooth extraction    . Laparoscopic assisted vaginal  hysterectomy N/A 06/25/2014    Procedure: LAPAROSCOPIC ASSISTED VAGINAL HYSTERECTOMY;  Surgeon: Cheri Fowler, MD;  Location: [facility];  Service: Gynecology;  Laterality: N/A;  . Bilateral salpingectomy Bilateral 06/25/2014    Procedure: BILATERAL SALPINGECTOMY;  Surgeon: Cheri Fowler, MD;  Location: [facility];  Service: Gynecology;  Laterality: Bilateral;  . Cystoscopy N/A 06/25/2014    Procedure: CYSTOSCOPY;  Surgeon: Cheri Fowler, MD;  Location: [facility];  Service: Gynecology;  Laterality: N/A;    Social History   Social History  . Marital Status: Divorced    Spouse Name: N/A  . Number of Children: N/A  . Years of Education: N/A   Occupational History  . Not on file.   Social History Main Topics  . Smoking status: Former Smoker -- 0.50 packs/day for 4 years    Types: Cigarettes    Quit date: 11/27/2009  . Smokeless tobacco: Never Used  . Alcohol Use: No  . Drug Use: No  . Sexual Activity: Yes    Birth Control/ Protection: Surgical     Comment: Essure Tubal   Other Topics Concern  . Not on file   Social History Narrative    Allergies  Allergen Reactions  . Biaxin [Clarithromycin] Rash  . Penicillins Rash  . Toradol [Ketorolac Tromethamine] Rash    Tolerates ibuprofen    Current Outpatient Prescriptions on File Prior to Visit  Medication  Sig Dispense Refill  . buPROPion (WELLBUTRIN) 100 MG tablet Take 100 mg by mouth 2 (two) times daily.    Marland Kitchen HYDROcodone-acetaminophen (NORCO/VICODIN) 5-325 MG tablet Take 1-2 tablets by mouth every 6 (six) hours as needed. (Patient not taking: Reported on 02/21/2015) 12 tablet 0  . ibuprofen (ADVIL,MOTRIN) 800 MG tablet Take 1 tablet (800 mg total) by mouth 3 (three) times daily. (Patient not taking: Reported on 02/21/2015) 21 tablet 0  . ondansetron (ZOFRAN) 4 MG tablet Take 1 tablet (4 mg total) by mouth every 6 (six) hours. (Patient not taking: Reported on 02/21/2015) 12 tablet 0   No current facility-administered medications on file  prior to visit.      Review of Systems  Constitutional: Negative for activity change and appetite change.  HENT: Negative for sinus pressure and sore throat.   Respiratory: Negative for chest tightness, shortness of breath and wheezing.   Gastrointestinal: Negative for abdominal pain, constipation and abdominal distention.  Genitourinary: Negative.   Musculoskeletal:       See history of present illness  Psychiatric/Behavioral: Negative for behavioral problems and dysphoric mood.       Objective: Filed Vitals:   02/21/15 0917 02/21/15 0919  BP:  117/77  Pulse:  110  Temp:  98.3 F (36.8 C)  TempSrc:  Oral  Resp:  16  Height: 5\' 3"  (1.6 m)   Weight: 189 lb (85.73 kg)   SpO2:  97%      Physical Exam  Constitutional: She is oriented to person, place, and time. She appears well-developed and well-nourished.  Cardiovascular: Normal heart sounds and intact distal pulses.  Tachycardia present.   No murmur heard. Pulmonary/Chest: Effort normal and breath sounds normal. She has no wheezes. She has no rales. She exhibits no tenderness.  Abdominal: Soft. Bowel sounds are normal. She exhibits no distension and no mass. There is no tenderness.  Musculoskeletal: She exhibits edema (2+ nonpitting edema of left ankle) and tenderness (Tenderness to palpation of the lateral and medial malleolusand on all range of motions.).  Neurological: She is alert and oriented to person, place, and time.   CLINICAL DATA: 29 year old female with persistent pain and swelling about the lateral aspect of the ankle after stepping in a hole while track were treating 5 days ago. Re-injury yesterday.  EXAM: LEFT ANKLE COMPLETE - 3+ VIEW  COMPARISON: Prior radiographs 02/12/2015  FINDINGS: Diffuse soft tissue swelling circumferentially about the ankle. Positive for ankle joint effusion similar compared to the recent prior imaging. However, there is no evidence of fracture or malalignment. Normal  bony mineralization. No lytic or blastic osseous lesion.  IMPRESSION: Persistent ankle joint effusion with increased circumferential soft tissue swelling.  No evidence of acute fracture or malalignment.   Electronically Signed  By: Jacqulynn Cadet M.D.  On: 02/16/2015 10:17        Assessment & Plan:  Left ankle sprain: X-rays so far have been unrevealing Advised to continue with left ankle brace, elevate the ankle and stay off left foot as much as possible. Crutches for weightbearing. Continue tramadol and ibuprofen have given a few tablets of Tylenol No. 3 since she states tramadol is not working. I have reviewed the [place] controlled substances Registry and have discussed my concerns with her and the fact that she is on multiple benzos; side effects of medications discussed and we will have to be cautious because to prescribing controlled substances to her.  Anxiety: Currently on Wellbutrin and Xanax which she received from [facility]  urgent care.  Chronic pelvic pain: Uses diazepam suppositories prescribed by her GYN.  This note has been created with Surveyor, quantity. Any transcriptional errors are unintentional.

## 2015-02-21 NOTE — Patient Instructions (Signed)
Ankle Sprain  An ankle sprain is an injury to the strong, fibrous tissues (ligaments) that hold the bones of your ankle joint together.   CAUSES  An ankle sprain is usually caused by a fall or by twisting your ankle. Ankle sprains most commonly occur when you step on the outer edge of your foot, and your ankle turns inward. People who participate in sports are more prone to these types of injuries.   SYMPTOMS    Pain in your ankle. The pain may be present at rest or only when you are trying to stand or walk.   Swelling.   Bruising. Bruising may develop immediately or within 1 to 2 days after your injury.   Difficulty standing or walking, particularly when turning corners or changing directions.  DIAGNOSIS   Your caregiver will ask you details about your injury and perform a physical exam of your ankle to determine if you have an ankle sprain. During the physical exam, your caregiver will press on and apply pressure to specific areas of your foot and ankle. Your caregiver will try to move your ankle in certain ways. An X-ray exam may be done to be sure a bone was not broken or a ligament did not separate from one of the bones in your ankle (avulsion fracture).   TREATMENT   Certain types of braces can help stabilize your ankle. Your caregiver can make a recommendation for this. Your caregiver may recommend the use of medicine for pain. If your sprain is severe, your caregiver may refer you to a surgeon who helps to restore function to parts of your skeletal system (orthopedist) or a physical therapist.  HOME CARE INSTRUCTIONS    Apply ice to your injury for 1-2 days or as directed by your caregiver. Applying ice helps to reduce inflammation and pain.    Put ice in a plastic bag.    Place a towel between your skin and the bag.    Leave the ice on for 15-20 minutes at a time, every 2 hours while you are awake.   Only take over-the-counter or prescription medicines for pain, discomfort, or fever as directed by  your caregiver.   Elevate your injured ankle above the level of your heart as much as possible for 2-3 days.   If your caregiver recommends crutches, use them as instructed. Gradually put weight on the affected ankle. Continue to use crutches or a cane until you can walk without feeling pain in your ankle.   If you have a plaster splint, wear the splint as directed by your caregiver. Do not rest it on anything harder than a pillow for the first 24 hours. Do not put weight on it. Do not get it wet. You may take it off to take a shower or bath.   You may have been given an elastic bandage to wear around your ankle to provide support. If the elastic bandage is too tight (you have numbness or tingling in your foot or your foot becomes cold and blue), adjust the bandage to make it comfortable.   If you have an air splint, you may blow more air into it or let air out to make it more comfortable. You may take your splint off at night and before taking a shower or bath. Wiggle your toes in the splint several times per day to decrease swelling.  SEEK MEDICAL CARE IF:    You have rapidly increasing bruising or swelling.   Your toes feel   extremely cold or you lose feeling in your foot.   Your pain is not relieved with medicine.  SEEK IMMEDIATE MEDICAL CARE IF:   Your toes are numb or blue.   You have severe pain that is increasing.  MAKE SURE YOU:    Understand these instructions.   Will watch your condition.   Will get help right away if you are not doing well or get worse.     This information is not intended to replace advice given to you by your health care provider. Make sure you discuss any questions you have with your health care provider.     Document Released: 03/30/2005 Document Revised: 04/20/2014 Document Reviewed: 04/11/2011  Elsevier Interactive Patient Education 2016 [employer]

## 2015-02-21 NOTE — Progress Notes (Signed)
PT's here for ED f/up for Left ankle sprain.   Pt describes pain as constant, throbbing, sharp. Pain rated 8.5/10  Pt states that she has fallen 2x's since ED d/c. She thinks she may have re injured her ankle.  Pt needs a referral to [facility].

## 2015-02-25 ENCOUNTER — Ambulatory Visit (INDEPENDENT_AMBULATORY_CARE_PROVIDER_SITE_OTHER): Payer: Medicaid Other | Admitting: Family Medicine

## 2015-02-25 ENCOUNTER — Encounter: Payer: Self-pay | Admitting: Family Medicine

## 2015-02-25 VITALS — BP 109/76 | HR 111 | Ht 63.0 in | Wt 189.0 lb

## 2015-02-25 DIAGNOSIS — S93402A Sprain of unspecified ligament of left ankle, initial encounter: Secondary | ICD-10-CM | POA: Diagnosis present

## 2015-02-25 NOTE — Patient Instructions (Signed)
You have a Grade 3 ankle sprain. Ice the area for 15 minutes at a time, 3-4 times a day Aleve 2 tabs twice a day with food OR ibuprofen 3 tabs three times a day with food for pain and inflammation. Ok to take the tylenol with codeine as needed in addition to this. Elevate above the level of your heart when possible Crutches if needed to help with walking Bear weight when tolerated Use laceup ankle brace to help with stability while you recover from this injury. Come out of the boot/brace twice a day to do Up/down and alphabet exercises 2-3 sets of each. You can consider starting the theraband strengthening exercises in a week if you're feeling well otherwise wait until you see me back. Consider physical therapy for strengthening and balance exercises. Follow up with me in 2 weeks.

## 2015-02-26 NOTE — Assessment & Plan Note (Signed)
independent review of radiographs shows no acute fracture, other bony abnormalities.  Consistent with Grade 3 sprain.  Shown home exercises to do daily.  Icing, elevation, nsaids regularly.  Switch to ASO if tolerated.  Crutches if needed.  F/u in 2 weeks for reevaluation.

## 2015-02-26 NOTE — Progress Notes (Signed)
PCP: No primary care provider on file. Consultation requested by: Arnoldo Morale MD  Subjective:   HPI: Patient is a 29 y.o. female here for left ankle injury.  Patient reports on 10/31 when out trick or treating she accidentally stepped in a hold and inverted her left ankle. Able to bear weight initially but not at 2am when she woke up in the middle of the night. + swelling and bruising. Has history of ankle instability though usually right ankle is the problem. She was wearing a cam walker on 11/7 when she got up out of a recliner and tripped on something, came out of boot and reinjured this ankle. Icing, taking tylenol #3, wearing boot. Has been elevating also. Pain is sharp, lateral ankle. No skin changes, fever, other complaints.  Past Medical History  Diagnosis Date  . Endometriosis   . History of pelvic inflammatory disease   . History of genital warts   . Pelvic pain in female   . Mood disorder (HCC)     UNSPECIFIED  . History of asthma     with bronchitis  . SVD (spontaneous vaginal delivery)   . Depression   . Anxiety   . History of kidney stones     passed stones, no surgery required  . Asthma     Current Outpatient Prescriptions on File Prior to Visit  Medication Sig Dispense Refill  . acetaminophen-codeine (TYLENOL #3) 300-30 MG tablet Take 1 tablet by mouth every 8 (eight) hours as needed for moderate pain. 30 tablet 0  . ALPRAZolam (XANAX) 1 MG tablet Take 1 mg by mouth 3 (three) times daily.    Marland Kitchen buPROPion (WELLBUTRIN) 100 MG tablet Take 100 mg by mouth 2 (two) times daily.    Marland Kitchen HYDROcodone-acetaminophen (NORCO/VICODIN) 5-325 MG tablet Take 1-2 tablets by mouth every 6 (six) hours as needed. (Patient not taking: Reported on 02/21/2015) 12 tablet 0  . ibuprofen (ADVIL,MOTRIN) 800 MG tablet Take 1 tablet (800 mg total) by mouth 3 (three) times daily. (Patient not taking: Reported on 02/21/2015) 21 tablet 0  . ondansetron (ZOFRAN) 4 MG tablet Take 1 tablet (4 mg  total) by mouth every 6 (six) hours. (Patient not taking: Reported on 02/21/2015) 12 tablet 0   No current facility-administered medications on file prior to visit.    Past Surgical History  Procedure Laterality Date  . Ablation colpoclesis    . Laparoscopy N/A 08/18/2013    Procedure: LAPAROSCOPY DIAGNOSTIC, FULGERATION OF ENDOMETROSIS;  Surgeon: Cheri Fowler, MD;  Location: [facility];  Service: Gynecology;  Laterality: N/A;  . Dx laparoscopy/ aspiration right ovarian cyst and bx posterior cul-de-sac  05-27-2005  . Essure tubal ligation Bilateral 2011  . Cysto with hydrodistension N/A 12/01/2013    Procedure: CYSTOSCOPY/HYDRODISTENSION with instillation of marcaine and pyridium;  Surgeon: Ardis Hughs, MD;  Location: [facility];  Service: Urology;  Laterality: N/A;  . Wisdom tooth extraction    . Laparoscopic assisted vaginal hysterectomy N/A 06/25/2014    Procedure: LAPAROSCOPIC ASSISTED VAGINAL HYSTERECTOMY;  Surgeon: Cheri Fowler, MD;  Location: [facility];  Service: Gynecology;  Laterality: N/A;  . Bilateral salpingectomy Bilateral 06/25/2014    Procedure: BILATERAL SALPINGECTOMY;  Surgeon: Cheri Fowler, MD;  Location: [facility];  Service: Gynecology;  Laterality: Bilateral;  . Cystoscopy N/A 06/25/2014    Procedure: CYSTOSCOPY;  Surgeon: Cheri Fowler, MD;  Location: [facility];  Service: Gynecology;  Laterality: N/A;    Allergies  Allergen Reactions  . Biaxin [Clarithromycin] Rash  .  Penicillins Rash  . Toradol [Ketorolac Tromethamine] Rash    Tolerates ibuprofen    Social History   Social History  . Marital Status: Divorced    Spouse Name: N/A  . Number of Children: N/A  . Years of Education: N/A   Occupational History  . Not on file.   Social History Main Topics  . Smoking status: Former Smoker -- 0.50 packs/day for 4 years    Types: Cigarettes    Quit date: 11/27/2009  . Smokeless tobacco: Never Used  . Alcohol Use: No  . Drug Use: No  . Sexual  Activity: Yes    Birth Control/ Protection: Surgical     Comment: Essure Tubal   Other Topics Concern  . Not on file   Social History Narrative    No family history on file.  BP 109/76 mmHg  Pulse 111  Ht 5\' 3"  (1.6 m)  Wt 189 lb (85.73 kg)  BMI 33.49 kg/m2  LMP 06/06/2014 (Approximate)  Review of Systems: See HPI above.    Objective:  Physical Exam:  Gen: NAD, comfortable in exam room.  Left ankle: Mod swelling, no bruising or other deformity. FROM TTP over ATFL, ant ankle joint.  No other tenderness. 2+ ant drawer, trace talar tilt.   Negative syndesmotic compression. Thompsons test negative. NV intact distally.  Right ankle: FROM without pain.    Assessment & Plan:  1. Left ankle injury - independent review of radiographs shows no acute fracture, other bony abnormalities.  Consistent with Grade 3 sprain.  Shown home exercises to do daily.  Icing, elevation, nsaids regularly.  Switch to ASO if tolerated.  Crutches if needed.  F/u in 2 weeks for reevaluation.

## 2015-03-04 ENCOUNTER — Telehealth: Payer: Self-pay | Admitting: Family Medicine

## 2015-03-04 ENCOUNTER — Encounter: Payer: Self-pay | Admitting: *Deleted

## 2015-03-04 NOTE — Telephone Encounter (Signed)
It's ok to write her out for another week through 03/11/15 - would see her for office visit that day.  Thanks!

## 2015-03-04 NOTE — Telephone Encounter (Signed)
Spoke to patient and told her letter was written. Appointment scheduled for 03-05-15.

## 2015-03-05 ENCOUNTER — Ambulatory Visit: Payer: Medicaid Other | Admitting: Family Medicine

## 2015-03-11 ENCOUNTER — Ambulatory Visit: Payer: Medicaid Other | Admitting: Family Medicine

## 2015-04-15 ENCOUNTER — Encounter (HOSPITAL_BASED_OUTPATIENT_CLINIC_OR_DEPARTMENT_OTHER): Payer: Self-pay | Admitting: Emergency Medicine

## 2015-04-15 ENCOUNTER — Emergency Department (HOSPITAL_BASED_OUTPATIENT_CLINIC_OR_DEPARTMENT_OTHER): Payer: Medicaid Other

## 2015-04-15 ENCOUNTER — Emergency Department (HOSPITAL_BASED_OUTPATIENT_CLINIC_OR_DEPARTMENT_OTHER)
Admission: EM | Admit: 2015-04-15 | Discharge: 2015-04-15 | Disposition: A | Discharge status: Home / Self Care | Payer: Medicaid Other | Attending: Emergency Medicine | Admitting: Emergency Medicine

## 2015-04-15 DIAGNOSIS — F419 Anxiety disorder, unspecified: Secondary | ICD-10-CM | POA: Diagnosis not present

## 2015-04-15 DIAGNOSIS — Z87891 Personal history of nicotine dependence: Secondary | ICD-10-CM | POA: Insufficient documentation

## 2015-04-15 DIAGNOSIS — Z9851 Tubal ligation status: Secondary | ICD-10-CM | POA: Diagnosis not present

## 2015-04-15 DIAGNOSIS — J45909 Unspecified asthma, uncomplicated: Secondary | ICD-10-CM | POA: Insufficient documentation

## 2015-04-15 DIAGNOSIS — Z791 Long term (current) use of non-steroidal anti-inflammatories (NSAID): Secondary | ICD-10-CM | POA: Insufficient documentation

## 2015-04-15 DIAGNOSIS — Z880 Allergy status to penicillin: Secondary | ICD-10-CM | POA: Insufficient documentation

## 2015-04-15 DIAGNOSIS — R102 Pelvic and perineal pain: Secondary | ICD-10-CM | POA: Insufficient documentation

## 2015-04-15 DIAGNOSIS — F39 Unspecified mood [affective] disorder: Secondary | ICD-10-CM | POA: Diagnosis not present

## 2015-04-15 DIAGNOSIS — Z87442 Personal history of urinary calculi: Secondary | ICD-10-CM | POA: Diagnosis not present

## 2015-04-15 DIAGNOSIS — M79605 Pain in left leg: Secondary | ICD-10-CM

## 2015-04-15 DIAGNOSIS — Z79899 Other long term (current) drug therapy: Secondary | ICD-10-CM | POA: Diagnosis not present

## 2015-04-15 DIAGNOSIS — Z8742 Personal history of other diseases of the female genital tract: Secondary | ICD-10-CM | POA: Insufficient documentation

## 2015-04-15 DIAGNOSIS — Z3202 Encounter for pregnancy test, result negative: Secondary | ICD-10-CM | POA: Diagnosis not present

## 2015-04-15 DIAGNOSIS — F329 Major depressive disorder, single episode, unspecified: Secondary | ICD-10-CM | POA: Diagnosis not present

## 2015-04-15 DIAGNOSIS — Z8619 Personal history of other infectious and parasitic diseases: Secondary | ICD-10-CM | POA: Insufficient documentation

## 2015-04-15 DIAGNOSIS — R1032 Left lower quadrant pain: Secondary | ICD-10-CM

## 2015-04-15 LAB — URINALYSIS, ROUTINE W REFLEX MICROSCOPIC
Bilirubin Urine: NEGATIVE
Glucose, UA: NEGATIVE mg/dL
Hgb urine dipstick: NEGATIVE
Ketones, ur: NEGATIVE mg/dL
Leukocytes, UA: NEGATIVE
Nitrite: NEGATIVE
Protein, ur: NEGATIVE mg/dL
Specific Gravity, Urine: 1.023 (ref 1.005–1.030)
pH: 6.5 (ref 5.0–8.0)

## 2015-04-15 LAB — WET PREP, GENITAL
Sperm: NONE SEEN
Trich, Wet Prep: NONE SEEN
Yeast Wet Prep HPF POC: NONE SEEN

## 2015-04-15 LAB — PREGNANCY, URINE: Preg Test, Ur: NEGATIVE

## 2015-04-15 MED ORDER — ONDANSETRON 4 MG PO TBDP
4.0000 mg | ORAL_TABLET | Freq: Once | ORAL | Status: AC
  Administered 2015-04-15: 4 mg via ORAL
  Filled 2015-04-15: qty 1

## 2015-04-15 MED ORDER — ACETAMINOPHEN 500 MG PO TABS
1000.0000 mg | ORAL_TABLET | Freq: Once | ORAL | Status: AC
  Administered 2015-04-15: 1000 mg via ORAL
  Filled 2015-04-15: qty 2

## 2015-04-15 NOTE — ED Notes (Signed)
[facility] contacted for transport

## 2015-04-15 NOTE — ED Provider Notes (Signed)
CSN: [id number]     Arrival date & time 04/15/15  [id number] History   First MD Initiated Contact with Patient 04/15/15 [phone]     Chief Complaint  Patient presents with  . Abdominal Pain  . [facility] patient      (Consider location/radiation/quality/duration/timing/severity/associated sxs/prior Treatment) HPI   Blood pressure 119/90, pulse 88, temperature 98.6 F (37 C), temperature source Oral, resp. rate 16, height 5\' 3"  (1.6 m), last menstrual period 06/06/2014, SpO2 100 %.  [name] is a 30 y.o. female complaining of 7 out of 10 left lower quadrant pain onset 1 week ago with associated possible vaginal bleeding. Patient states that she noticed blood on her underwear and also blood when she wipes herself after urination, states she is quite sure that this is not coming from the rectum, she denies diarrhea, melena, rectal discomfort. States that she's been taking ibuprofen and Tylenol with some relief. She had 2 Tylenol and 4 ibuprofen this morning. She reports nausea with no emesis. Denies dysuria, urinary frequency, foul-smelling or frequent urination, abnormal vaginal discharge. States she's not concerned about STDs, she was checked at the beginning of her detox program which was in November. Patient is at [facility] for drug rehab.   Past Medical History  Diagnosis Date  . Endometriosis   . History of pelvic inflammatory disease   . History of genital warts   . Pelvic pain in female   . Mood disorder (HCC)     UNSPECIFIED  . History of asthma     with bronchitis  . SVD (spontaneous vaginal delivery)   . Depression   . Anxiety   . History of kidney stones     passed stones, no surgery required  . Asthma    Past Surgical History  Procedure Laterality Date  . Ablation colpoclesis    . Laparoscopy N/A 08/18/2013    Procedure: LAPAROSCOPY DIAGNOSTIC, FULGERATION OF ENDOMETROSIS;  Surgeon: Cheri Fowler, MD;  Location: [facility];  Service: Gynecology;  Laterality: N/A;  . Dx  laparoscopy/ aspiration right ovarian cyst and bx posterior cul-de-sac  05-27-2005  . Essure tubal ligation Bilateral 2011  . Cysto with hydrodistension N/A 12/01/2013    Procedure: CYSTOSCOPY/HYDRODISTENSION with instillation of marcaine and pyridium;  Surgeon: Ardis Hughs, MD;  Location: [facility];  Service: Urology;  Laterality: N/A;  . Wisdom tooth extraction    . Laparoscopic assisted vaginal hysterectomy N/A 06/25/2014    Procedure: LAPAROSCOPIC ASSISTED VAGINAL HYSTERECTOMY;  Surgeon: Cheri Fowler, MD;  Location: [facility];  Service: Gynecology;  Laterality: N/A;  . Bilateral salpingectomy Bilateral 06/25/2014    Procedure: BILATERAL SALPINGECTOMY;  Surgeon: Cheri Fowler, MD;  Location: [facility];  Service: Gynecology;  Laterality: Bilateral;  . Cystoscopy N/A 06/25/2014    Procedure: CYSTOSCOPY;  Surgeon: Cheri Fowler, MD;  Location: [facility];  Service: Gynecology;  Laterality: N/A;   History reviewed. No pertinent family history. Social History  Substance Use Topics  . Smoking status: Former Smoker -- 0.50 packs/day for 4 years    Types: Cigarettes    Quit date: 11/27/2009  . Smokeless tobacco: Never Used  . Alcohol Use: No   OB History    Gravida Para Term Preterm AB TAB SAB Ectopic Multiple Living   2 2 2       2      Review of Systems  10 systems reviewed and found to be negative, except as noted in the HPI.   Allergies  Biaxin; Penicillins; and Toradol  Home Medications   Prior to Admission medications   Medication Sig Start Date End Date Taking? Authorizing Provider  carbamazepine (TEGRETOL) 200 MG tablet Take 200 mg by mouth 3 (three) times daily.   Yes Historical Provider, MD  gabapentin (NEURONTIN) 300 MG capsule Take 300 mg by mouth 3 (three) times daily.   Yes Historical Provider, MD  hydrOXYzine (VISTARIL) 25 MG capsule Take 25 mg by mouth 3 (three) times daily as needed.   Yes Historical Provider, MD  ibuprofen (ADVIL,MOTRIN) 800 MG  tablet Take 1 tablet (800 mg total) by mouth 3 (three) times daily. 02/16/15  Yes Hannah Muthersbaugh, PA-C   BP 116/73 mmHg  Pulse 77  Temp(Src) 98.6 F (37 C) (Oral)  Resp 18  Ht 5\' 3"  (1.6 m)  SpO2 100%  LMP 06/06/2014 (Approximate) Physical Exam  Constitutional: She is oriented to person, place, and time. She appears well-developed and well-nourished. No distress.  HENT:  Head: Normocephalic and atraumatic.  Mouth/Throat: Oropharynx is clear and moist.  Eyes: Conjunctivae and EOM are normal. Pupils are equal, round, and reactive to light.  Neck: Normal range of motion.  Cardiovascular: Normal rate, regular rhythm and intact distal pulses.   Pulmonary/Chest: Effort normal and breath sounds normal. No stridor.  Abdominal: Soft. Bowel sounds are normal. She exhibits no distension and no mass. There is tenderness. There is no rebound and no guarding.  Mild left lower quadrant tenderness to palpation with no guarding or rebound  Genitourinary:  Pelvic exam chaperoned by technician: No rashes or lesions, no abnormal vaginal discharge no cervical motion tenderness. Very mild left adnexal tenderness  Musculoskeletal: Normal range of motion.  Neurological: She is alert and oriented to person, place, and time.  Skin: She is not diaphoretic.  Psychiatric: She has a normal mood and affect.  Nursing note and vitals reviewed.   ED Course  Procedures (including critical care time) Labs Review Labs Reviewed  WET PREP, GENITAL - Abnormal; Notable for the following:    Clue Cells Wet Prep HPF POC PRESENT (*)    WBC, Wet Prep HPF POC MANY (*)    All other components within normal limits  URINALYSIS, ROUTINE W REFLEX MICROSCOPIC (NOT AT [facility]) - Abnormal; Notable for the following:    APPearance CLOUDY (*)    All other components within normal limits  PREGNANCY, URINE  GC/CHLAMYDIA PROBE AMP ([facility]) NOT AT [facility]    Imaging Review US Transvaginal Non-ob  04/15/2015  CLINICAL DATA:   30 year old female with history of left lower quadrant abdominal pain. EXAM: TRANSABDOMINAL AND TRANSVAGINAL ULTRASOUND OF PELVIS DOPPLER ULTRASOUND OF OVARIES TECHNIQUE: Both transabdominal and transvaginal ultrasound examinations of the pelvis were performed. Transabdominal technique was performed for global imaging of the pelvis including uterus, ovaries, adnexal regions, and pelvic cul-de-sac. It was necessary to proceed with endovaginal exam following the transabdominal exam to visualize the adnexal regions. Color and duplex Doppler ultrasound was utilized to evaluate blood flow to the ovaries. COMPARISON:  12/11/2014. FINDINGS: Uterus Status post hysterectomy. Endometrium N/A Right ovary Could not be visualized secondary to overlying bowel. Left ovary Measurements: 4.5 x 3.2 x 3.4 cm. Multiple small follicles. Normal appearance/no adnexal mass. Pulsed Doppler evaluation of both ovaries demonstrates normal low-resistance arterial and venous waveforms. Other findings No abnormal free fluid. IMPRESSION: 1. No acute findings. 2. Limited examination which was unable to visualize the right ovary. No large right adnexal mass identified. 3. Status post hysterectomy. Electronically Signed   By: Mauri Brooklyn.D.  On: 04/15/2015 12:57   US Pelvis Complete  04/15/2015  CLINICAL DATA:  30 year old female with history of left lower quadrant abdominal pain. EXAM: TRANSABDOMINAL AND TRANSVAGINAL ULTRASOUND OF PELVIS DOPPLER ULTRASOUND OF OVARIES TECHNIQUE: Both transabdominal and transvaginal ultrasound examinations of the pelvis were performed. Transabdominal technique was performed for global imaging of the pelvis including uterus, ovaries, adnexal regions, and pelvic cul-de-sac. It was necessary to proceed with endovaginal exam following the transabdominal exam to visualize the adnexal regions. Color and duplex Doppler ultrasound was utilized to evaluate blood flow to the ovaries. COMPARISON:  12/11/2014. FINDINGS:  Uterus Status post hysterectomy. Endometrium N/A Right ovary Could not be visualized secondary to overlying bowel. Left ovary Measurements: 4.5 x 3.2 x 3.4 cm. Multiple small follicles. Normal appearance/no adnexal mass. Pulsed Doppler evaluation of both ovaries demonstrates normal low-resistance arterial and venous waveforms. Other findings No abnormal free fluid. IMPRESSION: 1. No acute findings. 2. Limited examination which was unable to visualize the right ovary. No large right adnexal mass identified. 3. Status post hysterectomy. Electronically Signed   By: Vinnie Langton M.D.   On: 04/15/2015 12:57   I have personally reviewed and evaluated these images and lab results as part of my medical decision-making.   EKG Interpretation None      MDM   Final diagnoses:  Pelvic pain in female    Filed Vitals:   04/15/15 0936 04/15/15 1315  BP: 119/90 116/73  Pulse: 88 77  Temp: 98.6 F (37 C)   TempSrc: Oral   Resp: 16 18  Height: 5\' 3"  (1.6 m)   SpO2: 100% 100%    Medications  ondansetron (ZOFRAN-ODT) disintegrating tablet 4 mg (4 mg Oral Given 04/15/15 1106)  acetaminophen (TYLENOL) tablet 1,000 mg (1,000 mg Oral Given 04/15/15 1106)    [name] is 30 y.o. female presenting with left lower quadrant pain and blood which she noticed on the toilet paper after she wiped with urination. She also had some on her underwear. She is concerned because she status post partial hysterectomy, no urinary symptoms, UA is not suggestive of infection. On my pelvic exam I don't see any blood in the vaginal vault. Wet prep with many white blood cells however patient is unconcerned about STDs, she states she was recently tested and has not had any unprotected sex since that time. She does not report any abnormal vaginal discharge, no indication to treat BV based on the clue cells with lack of symptoms. Repeat abdominal exam remains nonsurgical. I have advised this patient that it is possible that the  blood may be coming from the rectum she is adamant it is not she declines a digital rectal examination with fecal occult blood testing. Patient is given OB/GYN follow-up. She is in recovery for drug abuse, counsel this patient on how to take Tylenol and Motrin at home for pain control.   Evaluation does not show pathology that would require ongoing emergent intervention or inpatient treatment. Pt is hemodynamically stable and mentating appropriately. Discussed findings and plan with patient/guardian, who agrees with care plan. All questions answered. Return precautions discussed and outpatient follow up given.       Monico Blitz, PA-C 04/15/15 [place], MD 04/15/15 1451

## 2015-04-15 NOTE — ED Notes (Signed)
Pt is a resident at [facility]

## 2015-04-15 NOTE — Discharge Instructions (Signed)
For pain control you may take:  800mg  of ibuprofen (that is usually 4 over the counter pills)  3 times a day (take with food) and acetaminophen 975mg  (this is 3 over the counter pills) four times a day. Do not drink alcohol or combine with other medications that have acetaminophen as an ingredient (Read the labels!).    Do not hesitate to return to the emergency room for any new, worsening or concerning symptoms.  Please obtain primary care using resource guide below. Let them know that you were seen in the emergency room and that they will need to obtain records for further outpatient management.   Pelvic Pain, Female Female pelvic pain can be caused by many different things and start from a variety of places. Pelvic pain refers to pain that is located in the lower half of the abdomen and between your hips. The pain may occur over a short period of time (acute) or may be reoccurring (chronic). The cause of pelvic pain may be related to disorders affecting the female reproductive organs (gynecologic), but it may also be related to the bladder, kidney stones, an intestinal complication, or muscle or skeletal problems. Getting help right away for pelvic pain is important, especially if there has been severe, sharp, or a sudden onset of unusual pain. It is also important to get help right away because some types of pelvic pain can be life threatening.  CAUSES  Below are only some of the causes of pelvic pain. The causes of pelvic pain can be in one of several categories.   Gynecologic.  Pelvic inflammatory disease.  Sexually transmitted infection.  Ovarian cyst or a twisted ovarian ligament (ovarian torsion).  Uterine lining that grows outside the uterus (endometriosis).  Fibroids, cysts, or tumors.  Ovulation.  Pregnancy.  Pregnancy that occurs outside the uterus (ectopic pregnancy).  Miscarriage.  Labor.  Abruption of the placenta or ruptured uterus.  Infection.  Uterine  infection (endometritis).  Bladder infection.  Diverticulitis.  Miscarriage related to a uterine infection (septic abortion).  Bladder.  Inflammation of the bladder (cystitis).  Kidney stone(s).  Gastrointestinal.  Constipation.  Diverticulitis.  Neurologic.  Trauma.  Feeling pelvic pain because of mental or emotional causes (psychosomatic).  Cancers of the bowel or pelvis. EVALUATION  Your caregiver will want to take a careful history of your concerns. This includes recent changes in your health, a careful gynecologic history of your periods (menses), and a sexual history. Obtaining your family history and medical history is also important. Your caregiver may suggest a pelvic exam. A pelvic exam will help identify the location and severity of the pain. It also helps in the evaluation of which organ system may be involved. In order to identify the cause of the pelvic pain and be properly treated, your caregiver may order tests. These tests may include:   A pregnancy test.  Pelvic ultrasonography.  An X-ray exam of the abdomen.  A urinalysis or evaluation of vaginal discharge.  Blood tests. HOME CARE INSTRUCTIONS   Only take over-the-counter or prescription medicines for pain, discomfort, or fever as directed by your caregiver.   Rest as directed by your caregiver.   Eat a balanced diet.   Drink enough fluids to make your urine clear or pale yellow, or as directed.   Avoid sexual intercourse if it causes pain.   Apply warm or cold compresses to the lower abdomen depending on which one helps the pain.   Avoid stressful situations.   Keep a  journal of your pelvic pain. Write down when it started, where the pain is located, and if there are things that seem to be associated with the pain, such as food or your menstrual cycle.  Follow up with your caregiver as directed.  SEEK MEDICAL CARE IF:  Your medicine does not help your pain.  You have abnormal  vaginal discharge. SEEK IMMEDIATE MEDICAL CARE IF:   You have heavy bleeding from the vagina.   Your pelvic pain increases.   You feel light-headed or faint.   You have chills.   You have pain with urination or blood in your urine.   You have uncontrolled diarrhea or vomiting.   You have a fever or persistent symptoms for more than 3 days.  You have a fever and your symptoms suddenly get worse.   You are being physically or sexually abused.   This information is not intended to replace advice given to you by your health care provider. Make sure you discuss any questions you have with your health care provider.   Document Released: 02/25/2004 Document Revised: 12/19/2014 Document Reviewed: 07/20/2011 Elsevier Interactive Patient Education 2016 [employer].   Emergency Department Resource Guide 1) Find a Doctor and Pay Out of Pocket Although you won't have to find out who is covered by your insurance plan, it is a good idea to ask around and get recommendations. You will then need to call the office and see if the doctor you have chosen will accept you as a new patient and what types of options they offer for patients who are self-pay. Some doctors offer discounts or will set up payment plans for their patients who do not have insurance, but you will need to ask so you aren't surprised when you get to your appointment.  2) Contact Your Local Health Department Not all health departments have doctors that can see patients for sick visits, but many do, so it is worth a call to see if yours does. If you don't know where your local health department is, you can check in your phone book. The CDC also has a tool to help you locate your state's health department, and many state websites also have listings of all of their local health departments.  3) Find a [facility] If your illness is not likely to be very severe or complicated, you may want to try a walk in clinic. These are  popping up all over the country in pharmacies, drugstores, and shopping centers. They're usually staffed by nurse practitioners or physician assistants that have been trained to treat common illnesses and complaints. They're usually fairly quick and inexpensive. However, if you have serious medical issues or chronic medical problems, these are probably not your best option.  No Primary Care Doctor: - Call Health Connect at  [phone] - they can help you locate a primary care doctor that  accepts your insurance, provides certain services, etc. - Physician Referral Service- [phone]  Chronic Pain Problems: Organization         Address  Phone   Notes  [facility]  [phone] Patients need to be referred by their primary care doctor.   Medication Assistance: Organization         Address  Phone   Notes  [facility] Medication [facility] [address] [place], [place] [address] [phone] --Must be a resident of [facility] -- Must have NO insurance coverage whatsoever (no Medicaid/ Medicare, etc.) --  The pt. MUST have a primary care doctor that directs their care regularly and follows them in the community   MedAssist  [phone]   [employer]  [phone]    Agencies that provide inexpensive medical care: Organization         Address  Phone   Notes  [place]  [phone]   [facility]    [phone]   [facility] [place], [place] [address] [phone]   [place] [address], [place] [phone]   Planned Parenthood    [phone]   [facility]    [phone]   [place] and [place] [address], [place] Phone:  [phone], Fax:  [phone] Hours of Operation:  9 am - 6 pm, M-F.  Also accepts Medicaid/Medicare and self-pay.  [facility] for [place] [address], [address], [place] Phone: [phone], Fax: [phone]. Hours of Operation:  8:30 am - 5:30 pm, M-F.  Also accepts Medicaid and self-pay.  [facility] [place] [address] [place] Phone: [phone]   [place], [place], [place] [phone], Ext. [phone] Mondays & Thursdays: 7-9 AM.  First 15 patients are seen on a first come, first serve basis.    [place] Providers:  Organization         Address  Phone   Notes  [facility] [address], [place] [phone] Also accepts self-pay patients.  [facility] [id number] [place], [place]  [phone]   [place], [address], [place] [phone]   [facility] [address], [place] [phone]   Lucianne Lei [address], [place]   [phone] Only accepts [place] Access [place] patients after they have their name applied to their card.   Self-Pay (no insurance) in [facility]:  Organization         Address  Phone   Notes  Sickle Cell Patients, [facility] Internal [facility] [place] [phone]   [facility] [place] [phone]   [facility] [place]  [place], [place], [place] [phone]   [facility]/Dr. Osei-Bonsu  [address] [place] or [address], [place] [phone] Phone number for both [place] and [place] locations is the same.  Urgent Medical and [facility] [address] [place] [phone]   [facility] [address], [place] or [address] [phone]   [facility] [address] [place] [phone], phone; [phone], fax Sees patients 1st and 3rd Saturday  of every month.  Must not qualify for public or private insurance (i.e. Medicaid, Medicare, [place] Health Choice, Veterans' Benefits)  Household income should be no more than 200% of the poverty level The clinic cannot treat you if you are pregnant or think you are pregnant  Sexually transmitted diseases are not treated at the clinic.    Dental Care: Organization         Address  Phone  Notes  [facility] [phone]  [place] [phone] Accepts children up to age 76 who are enrolled in [place] or [place]; pregnant women with a Medicaid card; and children who have applied for Medicaid or [place] Health Choice, but were declined, whose parents can pay a reduced fee at time of service.  [facility]  [address], [place] [phone] Accepts children up to age 80 who are enrolled in [place] or [place]; pregnant women with a Medicaid card; and children who have applied for Medicaid or [place] Health Choice, but were declined, whose parents can pay a reduced fee at time of service.  [facility] Adult Dental Access PROGRAM  [place] [phone] Patients are seen by appointment only. Walk-ins are not accepted. [place] will see patients 96 years of age and older. Monday - Tuesday (8am-5pm) Most Wednesdays (8:30-5pm) $30 per visit, cash only  [facility] Adult Dental Access PROGRAM  [address], [facility] [phone] Patients are seen by appointment only. Walk-ins are not accepted. [place] will see patients 14 years of age and older. One Wednesday Evening (Monthly: Volunteer Based).  $30 per visit, cash only  [place]  [phone] for adults; Children under age 66, call Graduate Pediatric Dentistry at [phone]. Children aged 55-14, please call [phone] to request a pediatric application.   Dental services are provided in all areas of dental care including fillings, crowns and bridges, complete and partial dentures, implants, gum treatment, root canals, and extractions. Preventive care is also provided. Treatment is provided to both adults and children. Patients are selected via a lottery and there is often a waiting list.   [facility] [address] [place]  [phone] [email or web address]   [facility] [address] [place], [place] [phone], Ext. [phone] Second and Fourth Thursday of each month, opens at 6:30 AM; Clinic ends at 9 AM.  Patients are seen on a first-come first-served basis, and a limited number are seen during each clinic.   [facility]  [address] [place] Danker [place], [place] [phone]   Eligibility Requirements You must have lived in [place], [place], or [place] counties for at least the last three months.   You cannot be eligible for state or federal sponsored [employer], including [employer], [place], or [employer].   You generally cannot be eligible for healthcare insurance through your employer.    How to apply: Eligibility screenings are held every Tuesday and Wednesday afternoon from 1:00 pm until 4:00 pm. You do not need an appointment for the interview!  [facility] [address], [place], [place]   [place]  [place] Department  [place]  [phone]    Behavioral Health Resources in the Community: Intensive Outpatient Programs Organization         Address  Phone  Notes  [facility] [place]. [address], [place], [place] [phone]   [facility] Outpatient [address], [place], [place]   ADS: Alcohol & Drug Svcs [address] [place], [place]   [place] [address]  [place], [place] or [phone]   Substance Abuse Resources Organization         Address  Phone  Notes  Alcohol  and Drug Services  [phone]   [place]  [phone]   [facility] [place]   Chinita Pester  [phone]   Residential & Outpatient Substance Abuse Program  [phone]   Psychological Services Organization         Address  Phone  Notes  [facility] [place]  [place]  [phone]   [place] [address] [place] or [phone]    Mobile Crisis Teams Organization         Address  Phone  Notes  [facility] Alternatives, Mobile Crisis Care Unit  [phone]   [facility] Psychotherapeutic Services  [address] [place], [place]   [place] Levels [address] [place] [phone]    Self-Help/Support Groups Organization         Address  Phone             Notes  [place]. of [place] - variety of support groups  [place] Call for more information  Narcotics Anonymous (NA), [facility] [address], [employer] [place]  2 meetings at this location   Special educational needs teacher         Address  Phone  Notes  ASAP Residential Treatment [place],    [place]  [phone]   [facility]  [address], [place] [phone], [place], [place]   [place], [place] [phone] Admissions: 8am-3pm M-F  Incentives Substance [place] [address],    [place], [place] [phone]   [facility] [address] [place], [place], [place]   [facility] [address],  [place], [place]   [facility] Programs - Intensive Outpatient [address] Kristeen Mans [address], [place], [place]   [facility] [place] ([place].,  [place], [place]  [phone] or [phone]   [facility] (RTS) [address] [place], [place] Accepts Medicaid  [facility] [place] [address],  [place] [phone] Substance Abuse/Addiction Treatment   [facility] Organization         Address  Phone  Notes  [facility]  [phone]   Domenic Schwab, PhD [address] Arlis Porta [place], [place]   [phone] or [phone]   [place], [place] [phone]   [facility] [address], [place], [place] [phone] Insurance/Medicaid/sponsorship through [facility] and Families [address] [place]                                    [place], [place] [phone] [place] [address], [place] [phone]    Dr. Adele Schilder  [phone]   [facility] Dept. 1) [address] [place] 2) [place] 3)  [place] [address], [place] [phone]  [phone]   [place] [phone] or [phone] (After Hours)

## 2015-04-15 NOTE — ED Notes (Signed)
Patient transported to Ultrasound 

## 2015-04-15 NOTE — ED Notes (Signed)
D/c instructions reviewed with pt. D/c to lobby to wait for [facility] transport

## 2015-04-15 NOTE — ED Notes (Signed)
Pt with pain to llq x 4 days, reports bleeding noted in toilet and on her underwear but unsure of source, pt had partial hysterectomy 3/16

## 2015-04-16 LAB — GC/CHLAMYDIA PROBE AMP (~~LOC~~) NOT AT ARMC
Chlamydia: NEGATIVE
Neisseria Gonorrhea: NEGATIVE

## 2016-06-01 ENCOUNTER — Encounter (HOSPITAL_COMMUNITY): Payer: Self-pay | Admitting: Emergency Medicine

## 2016-06-01 ENCOUNTER — Emergency Department (HOSPITAL_COMMUNITY)
Admission: EM | Admit: 2016-06-01 | Discharge: 2016-06-01 | Disposition: A | Discharge status: Home / Self Care | Payer: Medicaid Other | Attending: Emergency Medicine | Admitting: Emergency Medicine

## 2016-06-01 DIAGNOSIS — R319 Hematuria, unspecified: Secondary | ICD-10-CM

## 2016-06-01 DIAGNOSIS — Z79899 Other long term (current) drug therapy: Secondary | ICD-10-CM | POA: Insufficient documentation

## 2016-06-01 DIAGNOSIS — J45909 Unspecified asthma, uncomplicated: Secondary | ICD-10-CM | POA: Insufficient documentation

## 2016-06-01 DIAGNOSIS — N390 Urinary tract infection, site not specified: Secondary | ICD-10-CM | POA: Insufficient documentation

## 2016-06-01 DIAGNOSIS — F17210 Nicotine dependence, cigarettes, uncomplicated: Secondary | ICD-10-CM | POA: Insufficient documentation

## 2016-06-01 LAB — URINALYSIS, ROUTINE W REFLEX MICROSCOPIC
Bilirubin Urine: NEGATIVE
Glucose, UA: NEGATIVE mg/dL
Ketones, ur: NEGATIVE mg/dL
Nitrite: POSITIVE — AB
Protein, ur: NEGATIVE mg/dL
Specific Gravity, Urine: 1.019 (ref 1.005–1.030)
pH: 5 (ref 5.0–8.0)

## 2016-06-01 MED ORDER — TRAMADOL HCL 50 MG PO TABS
100.0000 mg | ORAL_TABLET | Freq: Once | ORAL | Status: AC
  Administered 2016-06-01: 100 mg via ORAL
  Filled 2016-06-01: qty 2

## 2016-06-01 MED ORDER — PHENAZOPYRIDINE HCL 100 MG PO TABS
100.0000 mg | ORAL_TABLET | Freq: Once | ORAL | Status: AC
  Administered 2016-06-01: 100 mg via ORAL
  Filled 2016-06-01: qty 1

## 2016-06-01 MED ORDER — IBUPROFEN 800 MG PO TABS
800.0000 mg | ORAL_TABLET | Freq: Once | ORAL | Status: DC
  Filled 2016-06-01: qty 1

## 2016-06-01 MED ORDER — ONDANSETRON HCL 4 MG PO TABS: 4.0000 mg | ORAL_TABLET | Freq: Four times a day (QID) | ORAL | 0 refills | Status: DC

## 2016-06-01 MED ORDER — HYDROXYZINE HCL 25 MG PO TABS
25.0000 mg | ORAL_TABLET | Freq: Once | ORAL | Status: DC
  Filled 2016-06-01: qty 1

## 2016-06-01 MED ORDER — ONDANSETRON HCL 4 MG PO TABS
4.0000 mg | ORAL_TABLET | Freq: Once | ORAL | Status: AC
  Administered 2016-06-01: 4 mg via ORAL
  Filled 2016-06-01: qty 1

## 2016-06-01 MED ORDER — CEPHALEXIN 500 MG PO CAPS: 500.0000 mg | ORAL_CAPSULE | Freq: Four times a day (QID) | ORAL | 0 refills | Status: DC

## 2016-06-01 MED ORDER — TRAMADOL HCL 50 MG PO TABS: 50.0000 mg | ORAL_TABLET | Freq: Four times a day (QID) | ORAL | 0 refills | Status: DC | PRN

## 2016-06-01 MED ORDER — CEPHALEXIN 500 MG PO CAPS
500.0000 mg | ORAL_CAPSULE | Freq: Once | ORAL | Status: AC
  Administered 2016-06-01: 500 mg via ORAL
  Filled 2016-06-01: qty 1

## 2016-06-01 NOTE — ED Triage Notes (Signed)
Patient complaining of lower abdominal pain with painful urination x 3 weeks.

## 2016-06-01 NOTE — Discharge Instructions (Signed)
Your vital signs are within normal limits. Your tests reveals a urinary tract infection. A culture has been sent to the lab to be processed. Please use Keflex 4 times daily with meals and at bedtime. Use ibuprofen for mild pain, use Ultram for more severe pain. Use Zofran for nausea if needed. Please see your Medicaid access physician for recheck of your urine in about 7 days.

## 2016-06-01 NOTE — ED Provider Notes (Signed)
[facility] DEPT Provider Note   CSN: [id number] Arrival date & time: 06/01/16  1901  By signing my name below, I, Collene Leyden, attest that this documentation has been prepared under the direction and in the presence of [employer] .Marland Kitchen Electronically Signed: Collene Leyden, Scribe. 06/01/16. 7:51 PM.   History   Chief Complaint Chief Complaint  Patient presents with  . Dysuria    HPI Comments: [name] is a 31 y.o. female with a hx of kidney stones, who presents to the Emergency Department complaining of gradual onset, constant lower abdominal pain that began 3 weeks ago. Patient has associated dysuria, lower back pain, fever (101 max, 3 days ago), nausea, emesis due to pain (2 days ago), greenish, unusual vaginal discharge, and urinary frequency. Patient states the pain wakes her up in the middle of the night. Patient describes the pain as sharp when urinating or lying down. Patient reports taking azo, uristat, summers eve douche, and wipes with no relief. Patient denies any other symptoms.   The history is provided by the patient. No language interpreter was used.    Past Medical History:  Diagnosis Date  . Anxiety   . Asthma   . Depression   . Endometriosis   . History of asthma    with bronchitis  . History of genital warts   . History of kidney stones    passed stones, no surgery required  . History of pelvic inflammatory disease   . Mood disorder (HCC)    UNSPECIFIED  . Pelvic pain in female   . SVD (spontaneous vaginal delivery)     Patient Active Problem List   Diagnosis Date Noted  . Sprain of ankle 02/21/2015  . S/P hysterectomy 06/25/2014  . Pelvic pain in female 08/18/2013    Past Surgical History:  Procedure Laterality Date  . ABDOMINAL HYSTERECTOMY    . ABLATION COLPOCLESIS    . BILATERAL SALPINGECTOMY Bilateral 06/25/2014   Procedure: BILATERAL SALPINGECTOMY;  Surgeon: Cheri Fowler, MD;  Location: [facility];  Service: Gynecology;   Laterality: Bilateral;  . CYSTO WITH HYDRODISTENSION N/A 12/01/2013   Procedure: CYSTOSCOPY/HYDRODISTENSION with instillation of marcaine and pyridium;  Surgeon: Ardis Hughs, MD;  Location: [facility];  Service: Urology;  Laterality: N/A;  . CYSTOSCOPY N/A 06/25/2014   Procedure: CYSTOSCOPY;  Surgeon: Cheri Fowler, MD;  Location: [facility];  Service: Gynecology;  Laterality: N/A;  . DX LAPAROSCOPY/ ASPIRATION RIGHT OVARIAN CYST AND BX POSTERIOR CUL-DE-SAC  05-27-2005  . ESSURE TUBAL LIGATION Bilateral 2011  . LAPAROSCOPIC ASSISTED VAGINAL HYSTERECTOMY N/A 06/25/2014   Procedure: LAPAROSCOPIC ASSISTED VAGINAL HYSTERECTOMY;  Surgeon: Cheri Fowler, MD;  Location: [facility];  Service: Gynecology;  Laterality: N/A;  . LAPAROSCOPY N/A 08/18/2013   Procedure: LAPAROSCOPY DIAGNOSTIC, FULGERATION OF ENDOMETROSIS;  Surgeon: Cheri Fowler, MD;  Location: [facility];  Service: Gynecology;  Laterality: N/A;  . WISDOM TOOTH EXTRACTION      OB History    Gravida Para Term Preterm AB Living   2 2 2     2    SAB TAB Ectopic Multiple Live Births                   Home Medications    Prior to Admission medications   Medication Sig Start Date End Date Taking? Authorizing Provider  carbamazepine (TEGRETOL) 200 MG tablet Take 200 mg by mouth 3 (three) times daily.    Historical Provider, MD  gabapentin (NEURONTIN) 300 MG capsule Take 300 mg by  mouth 3 (three) times daily.    Historical Provider, MD  hydrOXYzine (VISTARIL) 25 MG capsule Take 25 mg by mouth 3 (three) times daily as needed.    Historical Provider, MD  ibuprofen (ADVIL,MOTRIN) 800 MG tablet Take 1 tablet (800 mg total) by mouth 3 (three) times daily. 02/16/15   Jarrett Soho Muthersbaugh, PA-C    Family History History reviewed. No pertinent family history.  Social History Social History  Substance Use Topics  . Smoking status: Current Every Day Smoker    Packs/day: 0.50    Years: 4.00    Types: Cigarettes    Last attempt to  quit: 11/27/2009  . Smokeless tobacco: Never Used  . Alcohol use No     Allergies   Biaxin [clarithromycin]; Penicillins; and Toradol [ketorolac tromethamine]   Review of Systems Review of Systems  Gastrointestinal: Positive for abdominal pain.  Genitourinary: Positive for difficulty urinating, dysuria, flank pain and vaginal discharge. Negative for hematuria.  All other systems reviewed and are negative.    Physical Exam Updated Vital Signs BP 101/76 (BP Location: Left Arm)   Pulse 83   Temp 98.8 F (37.1 C) (Oral)   Resp 16   Ht 5\' 3"  (1.6 m)   Wt 163 lb (73.9 kg)   LMP 06/15/2014   SpO2 98%   BMI 28.87 kg/m   Physical Exam  Constitutional: She is oriented to person, place, and time. She appears well-developed.  HENT:  Head: Normocephalic and atraumatic.  Mouth/Throat: Oropharynx is clear and moist.  Eyes: Conjunctivae and EOM are normal. Pupils are equal, round, and reactive to light.  Neck: Normal range of motion. Neck supple.  Cardiovascular: Normal rate, regular rhythm and normal heart sounds.   Pulmonary/Chest: Effort normal and breath sounds normal. No respiratory distress. She has no wheezes.  Abdominal: Soft. Bowel sounds are normal. She exhibits no distension. There is tenderness.  Left lower quadrant and suprapubic area pain. No CVA tenderness present.   Musculoskeletal: Normal range of motion.  Neurological: She is alert and oriented to person, place, and time.  Skin: Skin is warm and dry.  Psychiatric: She has a normal mood and affect.     ED Treatments / Results  DIAGNOSTIC STUDIES: Oxygen Saturation is 98% on RA, normal by my interpretation.    COORDINATION OF CARE: 7:50 PM Discussed treatment plan with pt at bedside and pt agreed to plan, which includes urinalysis, urine culture, and culture for GC chlamydia.  Labs (all labs ordered are listed, but only abnormal results are displayed) Labs Reviewed  URINALYSIS, ROUTINE W REFLEX MICROSCOPIC      EKG  EKG Interpretation None       Radiology No results found.  Procedures Procedures (including critical care time)  Medications Ordered in ED Medications - No data to display   Initial Impression / Assessment and Plan / ED Course  I have reviewed the triage vital signs and the nursing notes.  Pertinent labs & imaging results that were available during my care of the patient were reviewed by me and considered in my medical decision making (see chart for details).     **I have reviewed nursing notes, vital signs, and all appropriate lab and imaging results for this patient.*  Final Clinical Impressions(s) / ED Diagnoses   MDM: 3 weeks of increased urinary frequency and  increased urgency. Patient reports vaginal discharge and a fever over the last couple of days. Will check urinalysis, urine culture, and culture for GC chlamydia. Doubt pregnancy due  to her history of a hysterectomy.  Urinalysis reveals an amber hazy specimen with a small hemoglobin. There positive nitrates and a small leukocyte esterase. Culture will be sent to the lab. Also send GC chlamydia culture by urine. Suspect urinary tract infection with hematuria. Patient will be treated with Keflex, Zofran, and 12 tablets of Ultram. Patient is to follow-up with the Medicaid access physician or return to the emergency department if any emergent changes, problems, or concerns. Final diagnoses:  Urinary tract infection with hematuria, site unspecified    New Prescriptions Discharge Medication List as of 06/01/2016  9:05 PM    START taking these medications   Details  cephALEXin (KEFLEX) 500 MG capsule Take 1 capsule (500 mg total) by mouth 4 (four) times daily., Starting Mon 06/01/2016, Print    ondansetron (ZOFRAN) 4 MG tablet Take 1 tablet (4 mg total) by mouth every 6 (six) hours., Starting Mon 06/01/2016, Print    traMADol (ULTRAM) 50 MG tablet Take 1 tablet (50 mg total) by mouth every 6 (six) hours as  needed., Starting Mon 06/01/2016, Print       **I personally performed the services described in this documentation, which was scribed in my presence. The recorded information has been reviewed and is accurate.Lily Kocher, PA-C 06/04/16 2034    Forde Dandy, MD 06/05/16 [phone]

## 2016-06-03 LAB — GC/CHLAMYDIA PROBE AMP (~~LOC~~) NOT AT ARMC
Chlamydia: NEGATIVE
Neisseria Gonorrhea: NEGATIVE

## 2016-06-13 ENCOUNTER — Emergency Department (HOSPITAL_COMMUNITY)
Admission: EM | Admit: 2016-06-13 | Discharge: 2016-06-13 | Disposition: A | Discharge status: Home / Self Care | Payer: Medicaid Other | Attending: Emergency Medicine | Admitting: Emergency Medicine

## 2016-06-13 ENCOUNTER — Encounter (HOSPITAL_COMMUNITY): Payer: Self-pay | Admitting: *Deleted

## 2016-06-13 DIAGNOSIS — Z79899 Other long term (current) drug therapy: Secondary | ICD-10-CM | POA: Insufficient documentation

## 2016-06-13 DIAGNOSIS — F17210 Nicotine dependence, cigarettes, uncomplicated: Secondary | ICD-10-CM | POA: Insufficient documentation

## 2016-06-13 DIAGNOSIS — J45909 Unspecified asthma, uncomplicated: Secondary | ICD-10-CM | POA: Insufficient documentation

## 2016-06-13 DIAGNOSIS — N3001 Acute cystitis with hematuria: Secondary | ICD-10-CM | POA: Insufficient documentation

## 2016-06-13 LAB — URINALYSIS, ROUTINE W REFLEX MICROSCOPIC
Bacteria, UA: NONE SEEN
Bilirubin Urine: NEGATIVE
Glucose, UA: NEGATIVE mg/dL
Ketones, ur: NEGATIVE mg/dL
Nitrite: NEGATIVE
Protein, ur: 30 mg/dL — AB
Specific Gravity, Urine: 1.024 (ref 1.005–1.030)
pH: 5 (ref 5.0–8.0)

## 2016-06-13 MED ORDER — NITROFURANTOIN MONOHYD MACRO 100 MG PO CAPS: 100.0000 mg | ORAL_CAPSULE | Freq: Two times a day (BID) | ORAL | 0 refills | Status: DC

## 2016-06-13 NOTE — ED Triage Notes (Signed)
Pt comes in for continued dysuria and bladder pain. Pt was seen two weeks ago and given an antibiotic. She states she felt better after the antibiotic but symptoms started again. Denies n/v/d.

## 2016-06-13 NOTE — Discharge Instructions (Signed)
If you were given medicines take as directed.  If you are on coumadin or contraceptives realize their levels and effectiveness is altered by many different medicines.  If you have any reaction (rash, tongues swelling, other) to the medicines stop taking and see a physician.    If your blood pressure was elevated in the ER make sure you follow up for management with a primary doctor or return for chest pain, shortness of breath or stroke symptoms.  Please follow up as directed and return to the ER or see a physician for new or worsening symptoms.  Thank you. Vitals:   06/13/16 0748 06/13/16 0749  BP:  124/86  Pulse: 87   Resp: 18   Temp: 97.8 F (36.6 C)   TempSrc: Oral   SpO2: 100%   Weight: 163 lb (73.9 kg)   Height: 5\' 3"  (1.6 m)

## 2016-06-13 NOTE — ED Notes (Signed)
Pt made aware to return if symptoms worsen or if any life threatening symptoms occur.   

## 2016-06-13 NOTE — ED Provider Notes (Signed)
[facility] DEPT Provider Note   CSN: [id number] Arrival date & time: 06/13/16  [id number]     History   Chief Complaint Chief Complaint  Patient presents with  . Dysuria    HPI [name] is a 31 y.o. female.  Patient presents for recurrent dysuria and bladder spasms. Patient was treated for UTI 2 weeks ago and improved with an antibiotic. Patient felt better and symptoms resolved and then today she started having burning with urination again. Patient denies any vaginal symptoms, no pelvic pain.      Past Medical History:  Diagnosis Date  . Anxiety   . Asthma   . Depression   . Endometriosis   . History of asthma    with bronchitis  . History of genital warts   . History of kidney stones    passed stones, no surgery required  . History of pelvic inflammatory disease   . Mood disorder (HCC)    UNSPECIFIED  . Pelvic pain in female   . SVD (spontaneous vaginal delivery)     Patient Active Problem List   Diagnosis Date Noted  . Sprain of ankle 02/21/2015  . S/P hysterectomy 06/25/2014  . Pelvic pain in female 08/18/2013    Past Surgical History:  Procedure Laterality Date  . ABDOMINAL HYSTERECTOMY    . ABLATION COLPOCLESIS    . BILATERAL SALPINGECTOMY Bilateral 06/25/2014   Procedure: BILATERAL SALPINGECTOMY;  Surgeon: Cheri Fowler, MD;  Location: [facility];  Service: Gynecology;  Laterality: Bilateral;  . CYSTO WITH HYDRODISTENSION N/A 12/01/2013   Procedure: CYSTOSCOPY/HYDRODISTENSION with instillation of marcaine and pyridium;  Surgeon: Ardis Hughs, MD;  Location: [facility];  Service: Urology;  Laterality: N/A;  . CYSTOSCOPY N/A 06/25/2014   Procedure: CYSTOSCOPY;  Surgeon: Cheri Fowler, MD;  Location: [facility];  Service: Gynecology;  Laterality: N/A;  . DX LAPAROSCOPY/ ASPIRATION RIGHT OVARIAN CYST AND BX POSTERIOR CUL-DE-SAC  05-27-2005  . ESSURE TUBAL LIGATION Bilateral 2011  . LAPAROSCOPIC ASSISTED VAGINAL HYSTERECTOMY N/A  06/25/2014   Procedure: LAPAROSCOPIC ASSISTED VAGINAL HYSTERECTOMY;  Surgeon: Cheri Fowler, MD;  Location: [facility];  Service: Gynecology;  Laterality: N/A;  . LAPAROSCOPY N/A 08/18/2013   Procedure: LAPAROSCOPY DIAGNOSTIC, FULGERATION OF ENDOMETROSIS;  Surgeon: Cheri Fowler, MD;  Location: [facility];  Service: Gynecology;  Laterality: N/A;  . WISDOM TOOTH EXTRACTION      OB History    Gravida Para Term Preterm AB Living   2 2 2     2    SAB TAB Ectopic Multiple Live Births                   Home Medications    Prior to Admission medications   Medication Sig Start Date End Date Taking? Authorizing Provider  carbamazepine (TEGRETOL) 200 MG tablet Take 200 mg by mouth 3 (three) times daily.    Historical Provider, MD  cephALEXin (KEFLEX) 500 MG capsule Take 1 capsule (500 mg total) by mouth 4 (four) times daily. 06/01/16   Lily Kocher, PA-C  gabapentin (NEURONTIN) 300 MG capsule Take 300 mg by mouth 3 (three) times daily.    Historical Provider, MD  hydrOXYzine (VISTARIL) 25 MG capsule Take 25 mg by mouth 3 (three) times daily as needed.    Historical Provider, MD  ibuprofen (ADVIL,MOTRIN) 800 MG tablet Take 1 tablet (800 mg total) by mouth 3 (three) times daily. 02/16/15   Hannah Muthersbaugh, PA-C  nitrofurantoin, macrocrystal-monohydrate, (MACROBID) 100 MG capsule Take 1 capsule (100 mg total)  by mouth 2 (two) times daily. X 7 days 06/13/16   Elnora Morrison, MD  ondansetron (ZOFRAN) 4 MG tablet Take 1 tablet (4 mg total) by mouth every 6 (six) hours. 06/01/16   Lily Kocher, PA-C  traMADol (ULTRAM) 50 MG tablet Take 1 tablet (50 mg total) by mouth every 6 (six) hours as needed. 06/01/16   Lily Kocher, PA-C    Family History No family history on file.  Social History Social History  Substance Use Topics  . Smoking status: Current Every Day Smoker    Packs/day: 0.50    Years: 4.00    Types: Cigarettes    Last attempt to quit: 11/27/2009  . Smokeless tobacco: Never Used  . Alcohol use  No     Allergies   Biaxin [clarithromycin]; Penicillins; and Toradol [ketorolac tromethamine]   Review of Systems Review of Systems  Constitutional: Negative for chills and fever.  Eyes: Negative for pain and visual disturbance.  Respiratory: Negative for cough.   Gastrointestinal: Negative for abdominal pain and vomiting.  Genitourinary: Positive for dysuria. Negative for hematuria, vaginal bleeding and vaginal discharge.  Musculoskeletal: Negative for arthralgias and back pain.  Skin: Negative for color change and rash.  Neurological: Negative for seizures and syncope.  All other systems reviewed and are negative.    Physical Exam Updated Vital Signs BP 124/86   Pulse 87   Temp 97.8 F (36.6 C) (Oral)   Resp 18   Ht 5\' 3"  (1.6 m)   Wt 163 lb (73.9 kg)   LMP 06/15/2014   SpO2 100%   BMI 28.87 kg/m   Physical Exam  Constitutional: She appears well-developed and well-nourished. No distress.  HENT:  Head: Normocephalic and atraumatic.  Eyes: Conjunctivae are normal.  Neck: Neck supple.  Cardiovascular: Normal rate.   Pulmonary/Chest: Effort normal.  Abdominal: Soft. There is no tenderness.  Musculoskeletal: She exhibits no edema.  Neurological: She is alert.  Skin: Skin is warm and dry.  Psychiatric: She has a normal mood and affect.  Nursing note and vitals reviewed.    ED Treatments / Results  Labs (all labs ordered are listed, but only abnormal results are displayed) Labs Reviewed  URINALYSIS, ROUTINE W REFLEX MICROSCOPIC - Abnormal; Notable for the following:       Result Value   APPearance CLOUDY (*)    Hgb urine dipstick SMALL (*)    Protein, ur 30 (*)    Leukocytes, UA LARGE (*)    All other components within normal limits    EKG  EKG Interpretation None       Radiology No results found.  Procedures Procedures (including critical care time)  Medications Ordered in ED Medications - No data to display   Initial Impression /  Assessment and Plan / ED Course  I have reviewed the triage vital signs and the nursing notes.  Pertinent labs & imaging results that were available during my care of the patient were reviewed by me and considered in my medical decision making (see chart for details).    Patient presents with clinically cystitis/UTI. Patient denies any vaginal symptoms. Patient well-appearing no flank pain. Plan for urine culture, oral antibiotics and recheck next week.  Results and differential diagnosis were discussed with the patient/parent/guardian. Xrays were independently reviewed by myself.  Close follow up outpatient was discussed, comfortable with the plan.   Medications - No data to display  Vitals:   06/13/16 0748 06/13/16 0749  BP:  124/86  Pulse: 87  Resp: 18   Temp: 97.8 F (36.6 C)   TempSrc: Oral   SpO2: 100%   Weight: 163 lb (73.9 kg)   Height: 5\' 3"  (1.6 m)     Final diagnoses:  Acute cystitis with hematuria    Final Clinical Impressions(s) / ED Diagnoses   Final diagnoses:  Acute cystitis with hematuria    New Prescriptions New Prescriptions   NITROFURANTOIN, MACROCRYSTAL-MONOHYDRATE, (MACROBID) 100 MG CAPSULE    Take 1 capsule (100 mg total) by mouth 2 (two) times daily. X 7 days     Elnora Morrison, MD 06/13/16 [phone]

## 2016-06-16 LAB — URINE CULTURE: Culture: >=100000 — AB

## 2016-06-17 ENCOUNTER — Telehealth: Payer: Self-pay | Admitting: Emergency Medicine

## 2016-06-17 NOTE — Telephone Encounter (Signed)
Post ED Visit - Positive Culture Follow-up  Culture report reviewed by antimicrobial stewardship pharmacist:  []  Elenor Quinones, Pharm.D. [x]  Heide Guile, Pharm.D., BCPS []  Parks Neptune, Pharm.D. []  Alycia Rossetti, Pharm.D., BCPS []  Cando, Pharm.D., BCPS, AAHIVP []  Legrand Como, Pharm.D., BCPS, AAHIVP []  Milus Glazier, Pharm.D. []  Stephens November, Pharm.D.  Positive urine culture Treated with nitrofurantoin, organism sensitive to the same and no further patient follow-up is required at this time.  Hazle Nordmann 06/17/2016, 1:29 PM

## 2016-06-23 ENCOUNTER — Encounter (HOSPITAL_COMMUNITY): Payer: Self-pay | Admitting: Emergency Medicine

## 2016-06-23 ENCOUNTER — Emergency Department (HOSPITAL_COMMUNITY)
Admission: EM | Admit: 2016-06-23 | Discharge: 2016-06-24 | Disposition: A | Discharge status: Home / Self Care | Payer: Medicaid Other | Attending: Emergency Medicine | Admitting: Emergency Medicine

## 2016-06-23 DIAGNOSIS — L559 Sunburn, unspecified: Secondary | ICD-10-CM | POA: Insufficient documentation

## 2016-06-23 DIAGNOSIS — J45909 Unspecified asthma, uncomplicated: Secondary | ICD-10-CM | POA: Insufficient documentation

## 2016-06-23 DIAGNOSIS — Z79899 Other long term (current) drug therapy: Secondary | ICD-10-CM | POA: Insufficient documentation

## 2016-06-23 DIAGNOSIS — G8929 Other chronic pain: Secondary | ICD-10-CM | POA: Insufficient documentation

## 2016-06-23 DIAGNOSIS — R1031 Right lower quadrant pain: Secondary | ICD-10-CM | POA: Insufficient documentation

## 2016-06-23 DIAGNOSIS — F17210 Nicotine dependence, cigarettes, uncomplicated: Secondary | ICD-10-CM | POA: Insufficient documentation

## 2016-06-23 DIAGNOSIS — R1032 Left lower quadrant pain: Secondary | ICD-10-CM | POA: Insufficient documentation

## 2016-06-23 DIAGNOSIS — N898 Other specified noninflammatory disorders of vagina: Secondary | ICD-10-CM | POA: Insufficient documentation

## 2016-06-23 LAB — URINALYSIS, ROUTINE W REFLEX MICROSCOPIC
Bilirubin Urine: NEGATIVE
Glucose, UA: NEGATIVE mg/dL
Hgb urine dipstick: NEGATIVE
Ketones, ur: NEGATIVE mg/dL
Leukocytes, UA: NEGATIVE
Nitrite: NEGATIVE
Protein, ur: NEGATIVE mg/dL
Specific Gravity, Urine: 1.019 (ref 1.005–1.030)
pH: 5 (ref 5.0–8.0)

## 2016-06-23 LAB — PREGNANCY, URINE: Preg Test, Ur: NEGATIVE

## 2016-06-23 NOTE — ED Triage Notes (Signed)
Pt c/o lower abd pain and has recently been diagnosed with uti and ovarian cysts.

## 2016-06-23 NOTE — ED Provider Notes (Signed)
[facility] DEPT Provider Note   CSN: [id number] Arrival date & time: 06/23/16  2230  By signing my name below, I, Oleh Genin, attest that this documentation has been prepared under the direction and in the presence of No att. providers found. Electronically Signed: Oleh Genin, Scribe. 06/23/16. 11:53 PM.  Time seen 23:45 PM   History   Chief Complaint Chief Complaint  Patient presents with  . Abdominal Pain    HPI [name] is a 31 y.o. female with history of ovarian cysts and nephrolithiasis who presents to the ED for evaluation of abdominal pain. This patient states that "around 2 years ago" she was diagnosed with ovarian cysts in both her ovaries by a GYN. In the last 2 days she had experienced constant, "sharp" suprapubic pain which she believes is similar to pain she had with ovarian cysts. She cannot provide any particular modifying factors. No fevers or chills. No nausea or vomiting. Also reporting 1 week of clear vaginal discharge. The patient was seen on 3/3 at this facility for abdominal pain and diagnosed with UTI; she was placed on Macrobid and discharged. She states that her symptoms from that encounter have resolved. Additionally, the patient was seen in 04/2016 and underwent abdominal ultrasound that was negative. She took a Xanax prior to arrival. She states she has taken oral toradol at home without relief, despite saying she has an allergy to toradol, ibuprofen, and tylenol.  The history is provided by the patient. No language interpreter was used.   PCP none  Past Medical History:  Diagnosis Date  . Anxiety   . Asthma   . Depression   . Endometriosis   . History of asthma    with bronchitis  . History of genital warts   . History of kidney stones    passed stones, no surgery required  . History of pelvic inflammatory disease   . Mood disorder (HCC)    UNSPECIFIED  . Pelvic pain in female   . SVD (spontaneous vaginal delivery)      Patient Active Problem List   Diagnosis Date Noted  . Sprain of ankle 02/21/2015  . S/P hysterectomy 06/25/2014  . Pelvic pain in female 08/18/2013    Past Surgical History:  Procedure Laterality Date  . ABDOMINAL HYSTERECTOMY    . ABLATION COLPOCLESIS    . BILATERAL SALPINGECTOMY Bilateral 06/25/2014   Procedure: BILATERAL SALPINGECTOMY;  Surgeon: Cheri Fowler, MD;  Location: [facility];  Service: Gynecology;  Laterality: Bilateral;  . CYSTO WITH HYDRODISTENSION N/A 12/01/2013   Procedure: CYSTOSCOPY/HYDRODISTENSION with instillation of marcaine and pyridium;  Surgeon: Ardis Hughs, MD;  Location: [facility];  Service: Urology;  Laterality: N/A;  . CYSTOSCOPY N/A 06/25/2014   Procedure: CYSTOSCOPY;  Surgeon: Cheri Fowler, MD;  Location: [facility];  Service: Gynecology;  Laterality: N/A;  . DX LAPAROSCOPY/ ASPIRATION RIGHT OVARIAN CYST AND BX POSTERIOR CUL-DE-SAC  05-27-2005  . ESSURE TUBAL LIGATION Bilateral 2011  . LAPAROSCOPIC ASSISTED VAGINAL HYSTERECTOMY N/A 06/25/2014   Procedure: LAPAROSCOPIC ASSISTED VAGINAL HYSTERECTOMY;  Surgeon: Cheri Fowler, MD;  Location: [facility];  Service: Gynecology;  Laterality: N/A;  . LAPAROSCOPY N/A 08/18/2013   Procedure: LAPAROSCOPY DIAGNOSTIC, FULGERATION OF ENDOMETROSIS;  Surgeon: Cheri Fowler, MD;  Location: [facility];  Service: Gynecology;  Laterality: N/A;  . WISDOM TOOTH EXTRACTION      OB History    Gravida Para Term Preterm AB Living   2 2 2     2    SAB TAB  Ectopic Multiple Live Births                   Home Medications    Prior to Admission medications   Medication Sig Start Date End Date Taking? Authorizing Provider  carbamazepine (TEGRETOL) 200 MG tablet Take 200 mg by mouth 3 (three) times daily.    Historical Provider, MD  cephALEXin (KEFLEX) 500 MG capsule Take 1 capsule (500 mg total) by mouth 4 (four) times daily. 06/01/16   Lily Kocher, PA-C  gabapentin (NEURONTIN) 300 MG capsule Take 300 mg by mouth 3  (three) times daily.    Historical Provider, MD  hydrOXYzine (VISTARIL) 25 MG capsule Take 25 mg by mouth 3 (three) times daily as needed.    Historical Provider, MD  ibuprofen (ADVIL,MOTRIN) 800 MG tablet Take 1 tablet (800 mg total) by mouth 3 (three) times daily. 02/16/15   Hannah Muthersbaugh, PA-C  naproxen (NAPROSYN) 500 MG tablet Take 1 po BID with food prn pain 06/24/16   Rolland Porter, MD  nitrofurantoin, macrocrystal-monohydrate, (MACROBID) 100 MG capsule Take 1 capsule (100 mg total) by mouth 2 (two) times daily. X 7 days 06/13/16   Elnora Morrison, MD  ondansetron (ZOFRAN) 4 MG tablet Take 1 tablet (4 mg total) by mouth every 6 (six) hours. 06/01/16   Lily Kocher, PA-C  traMADol (ULTRAM) 50 MG tablet Take 1 tablet (50 mg total) by mouth every 6 (six) hours as needed. 06/01/16   Lily Kocher, PA-C    Family History History reviewed. No pertinent family history.  Social History Social History  Substance Use Topics  . Smoking status: Current Every Day Smoker    Packs/day: 0.50    Years: 4.00    Types: Cigarettes    Last attempt to quit: 11/27/2009  . Smokeless tobacco: Never Used  . Alcohol use No  employed Denies smoking to me   Allergies   Biaxin [clarithromycin]; Penicillins; and Toradol [ketorolac tromethamine]   Review of Systems Review of Systems  Constitutional: Negative for chills and fever.  Gastrointestinal: Positive for abdominal pain. Negative for nausea and vomiting.  Genitourinary: Positive for vaginal discharge. Negative for dysuria.  All other systems reviewed and are negative.    Physical Exam Updated Vital Signs BP 121/69   Pulse 116   Temp 98.1 F (36.7 C)   Resp 18   Ht 5\' 3"  (1.6 m)   Wt 165 lb (74.8 kg)   LMP 06/15/2014   SpO2 100%   BMI 29.23 kg/m   Vital signs normal except tachycardia   Physical Exam  Constitutional: She is oriented to person, place, and time. She appears well-developed and well-nourished.  Non-toxic appearance. She  does not appear ill. No distress.  Patient is sleeping. Hard to keep awake for interview. Falls asleep in mid sentence  HENT:  Head: Normocephalic and atraumatic.  Right Ear: External ear normal.  Left Ear: External ear normal.  Nose: Nose normal. No mucosal edema or rhinorrhea.  Mouth/Throat: Oropharynx is clear and moist and mucous membranes are normal. No dental abscesses or uvula swelling.  Eyes: Conjunctivae and EOM are normal. Pupils are equal, round, and reactive to light.  Neck: Normal range of motion and full passive range of motion without pain. Neck supple.  Cardiovascular: Normal rate, regular rhythm and normal heart sounds.  Exam reveals no gallop and no friction rub.   No murmur heard. Pulmonary/Chest: Effort normal and breath sounds normal. No respiratory distress. She has no wheezes. She has no rhonchi.  She has no rales. She exhibits no tenderness and no crepitus.  Abdominal: Soft. Normal appearance and bowel sounds are normal. She exhibits no distension. There is no rebound and no guarding.    Tender diffusely in the lower abdomen.   Musculoskeletal: Normal range of motion. She exhibits no edema or tenderness.  Moves all extremities well.   Neurological: She is alert and oriented to person, place, and time. She has normal strength. No cranial nerve deficit.  Skin: Skin is warm, dry and intact. No rash noted. No erythema. No pallor.  She has sunburn in a bikini pattern, states due to tanning bed.   Psychiatric: She has a normal mood and affect. Her speech is normal and behavior is normal. Her mood appears not anxious.  Nursing note and vitals reviewed.    ED Treatments / Results  DIAGNOSTIC STUDIES: Oxygen Saturation is 100 percent on room air which is normal by my interpretation.      Labs (all labs ordered are listed, but only abnormal results are displayed) Results for orders placed or performed during the hospital encounter of 06/23/16  Pregnancy, urine    Result Value Ref Range   Preg Test, Ur NEGATIVE NEGATIVE  Urinalysis, Routine w reflex microscopic  Result Value Ref Range   Color, Urine YELLOW YELLOW   APPearance CLOUDY (A) CLEAR   Specific Gravity, Urine 1.019 1.005 - 1.030   pH 5.0 5.0 - 8.0   Glucose, UA NEGATIVE NEGATIVE mg/dL   Hgb urine dipstick NEGATIVE NEGATIVE   Bilirubin Urine NEGATIVE NEGATIVE   Ketones, ur NEGATIVE NEGATIVE mg/dL   Protein, ur NEGATIVE NEGATIVE mg/dL   Nitrite NEGATIVE NEGATIVE   Leukocytes, UA NEGATIVE NEGATIVE   Laboratory interpretation all normal    April 14, 2016 Study Result   CLINICAL DATA:  31 year old female with history of left lower quadrant abdominal pain.  EXAM: TRANSABDOMINAL AND TRANSVAGINAL ULTRASOUND OF PELVIS  DOPPLER ULTRASOUND OF OVARIES  TECHNIQUE: Both transabdominal and transvaginal ultrasound examinations of the pelvis were performed. Transabdominal technique was performed for global imaging of the pelvis including uterus, ovaries, adnexal regions, and pelvic cul-de-sac.  It was necessary to proceed with endovaginal exam following the transabdominal exam to visualize the adnexal regions. Color and duplex Doppler ultrasound was utilized to evaluate blood flow to the ovaries.  COMPARISON:  12/11/2014.  FINDINGS: Uterus  Status post hysterectomy.  Endometrium  N/A  Right ovary  Could not be visualized secondary to overlying bowel.  Left ovary  Measurements: 4.5 x 3.2 x 3.4 cm. Multiple small follicles. Normal appearance/no adnexal mass.  Pulsed Doppler evaluation of both ovaries demonstrates normal low-resistance arterial and venous waveforms.  Other findings  No abnormal free fluid.  IMPRESSION: 1. No acute findings. 2. Limited examination which was unable to visualize the right ovary. No large right adnexal mass identified. 3. Status post hysterectomy.   Electronically Signed   By: Vinnie Langton M.D.   On:  04/15/2015 12:57       Procedures Procedures (including critical care time)  Medications Ordered in ED Medications - No data to display   Initial Impression / Assessment and Plan / ED Course  I have reviewed the triage vital signs and the nursing notes.  Pertinent labs & imaging results that were available during my care of the patient were reviewed by me and considered in my medical decision making (see chart for details).   COORDINATION OF CARE: 11:52 PM Recommended the patient to attend followup with gynecologist. Will prescribe  naproxen. Patient is amenable for discharge.   Patient was seen in the ED on January 2 for left lower quadrant pain. She had an ultrasound done at that time that was unrevealing. She was seen again on February 19 with lower abdominal pain for several weeks. She was was diagnosed with a UTI. He was seen again on March 3 with urinary tract symptoms which has resolved with antibiotics. Patient does admit that her discomfort tonight is the chronic pain that she has been having. She was advised to follow-up with GYN. Although patient states she can't take IV or IM Toradol she tells me several times that she has Toradol that she takes at home without relief. She also states she's taken ibuprofen and Tylenol without relief.  Review of the [place] shows patient has been on Suboxone 8/2 mg that started in January. They are prescribed by Regino Schultze at [facility] internal medicine in [place]. She received #14 on January 4, January 11, January 16, February 7, and #8 on February 14. She gets prescribed #90 alprazolam monthly by nurse  practitioner Everardo Beals who also prescribed #12 tramadol on February 20.  Final Clinical Impressions(s) / ED Diagnoses   Final diagnoses:  Abdominal pain, chronic, bilateral lower quadrant    New Prescriptions Discharge Medication List as of 06/24/2016 12:06 AM    START taking these medications    Details  naproxen (NAPROSYN) 500 MG tablet Take 1 po BID with food prn pain, Print       Plan discharge  Rolland Porter, MD, FACEP   I personally performed the services described in this documentation, which was scribed in my presence. The recorded information has been reviewed and considered.  Rolland Porter, MD, Barbette Or, MD 06/24/16 [phone]

## 2016-06-23 NOTE — ED Notes (Signed)
ED Provider at bedside. 

## 2016-06-24 ENCOUNTER — Emergency Department (HOSPITAL_COMMUNITY)
Admission: EM | Admit: 2016-06-24 | Discharge: 2016-06-25 | Disposition: A | Discharge status: Home / Self Care | Payer: Self-pay | Attending: Emergency Medicine | Admitting: Emergency Medicine

## 2016-06-24 ENCOUNTER — Encounter (HOSPITAL_COMMUNITY): Payer: Self-pay | Admitting: *Deleted

## 2016-06-24 ENCOUNTER — Emergency Department (HOSPITAL_COMMUNITY): Payer: Self-pay

## 2016-06-24 DIAGNOSIS — R1030 Lower abdominal pain, unspecified: Secondary | ICD-10-CM | POA: Insufficient documentation

## 2016-06-24 DIAGNOSIS — J45909 Unspecified asthma, uncomplicated: Secondary | ICD-10-CM | POA: Insufficient documentation

## 2016-06-24 DIAGNOSIS — F17210 Nicotine dependence, cigarettes, uncomplicated: Secondary | ICD-10-CM | POA: Insufficient documentation

## 2016-06-24 DIAGNOSIS — R1032 Left lower quadrant pain: Secondary | ICD-10-CM | POA: Insufficient documentation

## 2016-06-24 DIAGNOSIS — R109 Unspecified abdominal pain: Secondary | ICD-10-CM

## 2016-06-24 DIAGNOSIS — G8929 Other chronic pain: Secondary | ICD-10-CM | POA: Insufficient documentation

## 2016-06-24 DIAGNOSIS — Z79899 Other long term (current) drug therapy: Secondary | ICD-10-CM | POA: Insufficient documentation

## 2016-06-24 DIAGNOSIS — R1031 Right lower quadrant pain: Secondary | ICD-10-CM | POA: Insufficient documentation

## 2016-06-24 LAB — COMPREHENSIVE METABOLIC PANEL
ALT: 34 U/L (ref 14–54)
AST: 24 U/L (ref 15–41)
Albumin: 4.4 g/dL (ref 3.5–5.0)
Alkaline Phosphatase: 54 U/L (ref 38–126)
Anion gap: 8 (ref 5–15)
BUN: 16 mg/dL (ref 6–20)
CO2: 27 mmol/L (ref 22–32)
Calcium: 9.5 mg/dL (ref 8.9–10.3)
Chloride: 102 mmol/L (ref 101–111)
Creatinine, Ser: 0.83 mg/dL (ref 0.44–1.00)
GFR calc Af Amer: >60 mL/min (ref >60)
GFR calc non Af Amer: >60 mL/min (ref >60)
Glucose, Bld: 61 mg/dL — ABNORMAL LOW (ref 65–99)
Potassium: 3.6 mmol/L (ref 3.5–5.1)
Sodium: 137 mmol/L (ref 135–145)
Total Bilirubin: 0.4 mg/dL (ref 0.3–1.2)
Total Protein: 7.5 g/dL (ref 6.5–8.1)

## 2016-06-24 LAB — URINALYSIS, ROUTINE W REFLEX MICROSCOPIC
Bilirubin Urine: NEGATIVE
Glucose, UA: NEGATIVE mg/dL
Hgb urine dipstick: NEGATIVE
Ketones, ur: 5 mg/dL — AB
Leukocytes, UA: NEGATIVE
Nitrite: NEGATIVE
Protein, ur: NEGATIVE mg/dL
Specific Gravity, Urine: 1.025 (ref 1.005–1.030)
pH: 5 (ref 5.0–8.0)

## 2016-06-24 LAB — CBC WITH DIFFERENTIAL/PLATELET
Basophils Absolute: 0 10*3/uL (ref 0.0–0.1)
Basophils Relative: 0 %
Eosinophils Absolute: 0.2 10*3/uL (ref 0.0–0.7)
Eosinophils Relative: 2 %
HCT: 41.6 % (ref 36.0–46.0)
Hemoglobin: 14.3 g/dL (ref 12.0–15.0)
Lymphocytes Relative: 28 %
Lymphs Abs: 2.6 10*3/uL (ref 0.7–4.0)
MCH: 34.1 pg — ABNORMAL HIGH (ref 26.0–34.0)
MCHC: 34.4 g/dL (ref 30.0–36.0)
MCV: 99.3 fL (ref 78.0–100.0)
Monocytes Absolute: 0.7 10*3/uL (ref 0.1–1.0)
Monocytes Relative: 8 %
Neutro Abs: 5.9 10*3/uL (ref 1.7–7.7)
Neutrophils Relative %: 62 %
Platelets: 337 10*3/uL (ref 150–400)
RBC: 4.19 MIL/uL (ref 3.87–5.11)
RDW: 13.8 % (ref 11.5–15.5)
WBC: 9.4 10*3/uL (ref 4.0–10.5)

## 2016-06-24 MED ORDER — IOPAMIDOL (ISOVUE-300) INJECTION 61%
INTRAVENOUS | Status: AC
  Administered 2016-06-24: 30 mL
  Filled 2016-06-24: qty 30

## 2016-06-24 MED ORDER — IOPAMIDOL (ISOVUE-300) INJECTION 61%
100.0000 mL | Freq: Once | INTRAVENOUS | Status: AC | PRN
  Administered 2016-06-24: 100 mL via INTRAVENOUS

## 2016-06-24 MED ORDER — NAPROXEN 500 MG PO TABS: ORAL_TABLET | ORAL | 0 refills | Status: DC

## 2016-06-24 NOTE — ED Triage Notes (Signed)
Pt c/o lower left abdominal pain for the last 4 days; pt states she has some nausea but denies any v/d

## 2016-06-24 NOTE — ED Notes (Signed)
Pt states left lower abdominal pain for the past 4 days. Was seen in ED yesterday, pain medication not helping. US done in January, no acute findings.

## 2016-06-24 NOTE — ED Provider Notes (Signed)
[facility] DEPT Provider Note   CSN: [id number] Arrival date & time: 06/24/16  2154     History   Chief Complaint Chief Complaint  Patient presents with  . Abdominal Pain    HPI [name] is a 31 y.o. female.  The history is provided by the patient. No language interpreter was used.  Abdominal Pain   This is a new problem. The current episode started 2 days ago. The problem occurs constantly. The problem has been gradually worsening. The pain is associated with an unknown factor. The pain is located in the RLQ, LLQ and suprapubic region. The pain is severe. Nothing aggravates the symptoms. Nothing relieves the symptoms.    Past Medical History:  Diagnosis Date  . Anxiety   . Asthma   . Depression   . Endometriosis   . History of asthma    with bronchitis  . History of genital warts   . History of kidney stones    passed stones, no surgery required  . History of pelvic inflammatory disease   . Mood disorder (HCC)    UNSPECIFIED  . Pelvic pain in female   . SVD (spontaneous vaginal delivery)     Patient Active Problem List   Diagnosis Date Noted  . Sprain of ankle 02/21/2015  . S/P hysterectomy 06/25/2014  . Pelvic pain in female 08/18/2013    Past Surgical History:  Procedure Laterality Date  . ABDOMINAL HYSTERECTOMY    . ABLATION COLPOCLESIS    . BILATERAL SALPINGECTOMY Bilateral 06/25/2014   Procedure: BILATERAL SALPINGECTOMY;  Surgeon: Cheri Fowler, MD;  Location: [facility];  Service: Gynecology;  Laterality: Bilateral;  . CYSTO WITH HYDRODISTENSION N/A 12/01/2013   Procedure: CYSTOSCOPY/HYDRODISTENSION with instillation of marcaine and pyridium;  Surgeon: Ardis Hughs, MD;  Location: [facility];  Service: Urology;  Laterality: N/A;  . CYSTOSCOPY N/A 06/25/2014   Procedure: CYSTOSCOPY;  Surgeon: Cheri Fowler, MD;  Location: [facility];  Service: Gynecology;  Laterality: N/A;  . DX LAPAROSCOPY/ ASPIRATION RIGHT OVARIAN CYST AND BX  POSTERIOR CUL-DE-SAC  05-27-2005  . ESSURE TUBAL LIGATION Bilateral 2011  . LAPAROSCOPIC ASSISTED VAGINAL HYSTERECTOMY N/A 06/25/2014   Procedure: LAPAROSCOPIC ASSISTED VAGINAL HYSTERECTOMY;  Surgeon: Cheri Fowler, MD;  Location: [facility];  Service: Gynecology;  Laterality: N/A;  . LAPAROSCOPY N/A 08/18/2013   Procedure: LAPAROSCOPY DIAGNOSTIC, FULGERATION OF ENDOMETROSIS;  Surgeon: Cheri Fowler, MD;  Location: [facility];  Service: Gynecology;  Laterality: N/A;  . WISDOM TOOTH EXTRACTION      OB History    Gravida Para Term Preterm AB Living   2 2 2     2    SAB TAB Ectopic Multiple Live Births                   Home Medications    Prior to Admission medications   Medication Sig Start Date End Date Taking? Authorizing Provider  carbamazepine (TEGRETOL) 200 MG tablet Take 200 mg by mouth 3 (three) times daily.    Historical Provider, MD  cephALEXin (KEFLEX) 500 MG capsule Take 1 capsule (500 mg total) by mouth 4 (four) times daily. 06/01/16   Lily Kocher, PA-C  gabapentin (NEURONTIN) 300 MG capsule Take 300 mg by mouth 3 (three) times daily.    Historical Provider, MD  hydrOXYzine (VISTARIL) 25 MG capsule Take 25 mg by mouth 3 (three) times daily as needed.    Historical Provider, MD  ibuprofen (ADVIL,MOTRIN) 800 MG tablet Take 1 tablet (800 mg total) by  mouth 3 (three) times daily. 02/16/15   Hannah Muthersbaugh, PA-C  naproxen (NAPROSYN) 500 MG tablet Take 1 po BID with food prn pain 06/24/16   Rolland Porter, MD  nitrofurantoin, macrocrystal-monohydrate, (MACROBID) 100 MG capsule Take 1 capsule (100 mg total) by mouth 2 (two) times daily. X 7 days 06/13/16   Elnora Morrison, MD  ondansetron (ZOFRAN) 4 MG tablet Take 1 tablet (4 mg total) by mouth every 6 (six) hours. 06/01/16   Lily Kocher, PA-C  traMADol (ULTRAM) 50 MG tablet Take 1 tablet (50 mg total) by mouth every 6 (six) hours as needed. 06/01/16   Lily Kocher, PA-C    Family History History reviewed. No pertinent family  history.  Social History Social History  Substance Use Topics  . Smoking status: Current Every Day Smoker    Packs/day: 0.50    Years: 4.00    Types: Cigarettes    Last attempt to quit: 11/27/2009  . Smokeless tobacco: Never Used  . Alcohol use No     Allergies   Biaxin [clarithromycin]; Penicillins; and Toradol [ketorolac tromethamine]   Review of Systems Review of Systems  Gastrointestinal: Positive for abdominal pain.  All other systems reviewed and are negative.    Physical Exam Updated Vital Signs BP 121/74 (BP Location: Left Arm)   Pulse 103   Temp 97.9 F (36.6 C) (Oral)   Resp 18   Ht 5\' 3"  (1.6 m)   Wt 74.8 kg   LMP 06/15/2014   SpO2 100%   BMI 29.23 kg/m   Physical Exam  Constitutional: She appears well-developed and well-nourished.  HENT:  Head: Normocephalic.  Eyes: Pupils are equal, round, and reactive to light.  Cardiovascular: Normal rate.   Pulmonary/Chest: Effort normal.  Abdominal: Soft. There is tenderness.  Musculoskeletal: Normal range of motion.  Neurological: She is alert.  Skin: Skin is warm.  Psychiatric: She has a normal mood and affect.  Nursing note and vitals reviewed.    ED Treatments / Results  Labs (all labs ordered are listed, but only abnormal results are displayed) Labs Reviewed  URINALYSIS, ROUTINE W REFLEX MICROSCOPIC - Abnormal; Notable for the following:       Result Value   APPearance HAZY (*)    Ketones, ur 5 (*)    All other components within normal limits  CBC WITH DIFFERENTIAL/PLATELET - Abnormal; Notable for the following:    MCH 34.1 (*)    All other components within normal limits  COMPREHENSIVE METABOLIC PANEL - Abnormal; Notable for the following:    Glucose, Bld 61 (*)    All other components within normal limits    EKG  EKG Interpretation None       Radiology No results found.  Procedures Procedures (including critical care time)  Medications Ordered in ED Medications - No data to  display   Initial Impression / Assessment and Plan / ED Course  I have reviewed the triage vital signs and the nursing notes.  Pertinent labs & imaging results that were available during my care of the patient were reviewed by me and considered in my medical decision making (see chart for details).  Clinical Course as of Jun 24 2345  Wed Jun 24, 2016  2347 CT ABDOMEN PELVIS W CONTRAST [LS]    Clinical Course User Index [LS] Fransico Meadow, PA-C    Pt has history of substance abuse.  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 Pt denies prescription abuse.  Pt has rx's for suboxone until feb 20.  Pt reports her family was filling rx under her name.    Ct scan no ovarian cyst, no kidney stone no elevation in wbc's urine is negative  I will give pt tramadol and zofran.  On reevaluation . Pt sleeping.  Pt difficult to arouse.   Pt reports she has been taking her medications.   Pt advised she will need a ride home. I am uncomfortable giving her medication here.  I think it best not to give any controlled medication.  Pt given zofran rx and bentyl.  Final Clinical Impressions(s) / ED Diagnoses   Final diagnoses:  Chronic abdominal pain    New Prescriptions Current Discharge Medication List     Meds ordered this encounter  Medications  . iopamidol (ISOVUE-300) 61 % injection    Haney, Larry   : cabinet override  . ALPRAZolam (XANAX) 1 MG tablet    Sig: Take 1 mg by mouth 3 (three) times daily.    Refill:  2  . iopamidol (ISOVUE-300) 61 % injection 100 mL  . DISCONTD: traMADol (ULTRAM) 50 MG tablet    Sig: Take 1 tablet (50 mg total) by mouth every 6 (six) hours as needed.    Dispense:  12 tablet    Refill:  0    Order Specific Question:   Supervising Provider    Answer:   MILLER, BRIAN [[id number]]  . DISCONTD: ondansetron (ZOFRAN) 4 MG tablet    Sig: Take 1 tablet (4 mg total) by mouth every 6 (six) hours.    Dispense:  12 tablet    Refill:  0    Order Specific Question:   Supervising Provider     Answer:   MILLER, BRIAN [[id number]]  . ondansetron (ZOFRAN) 4 MG tablet    Sig: Take 1 tablet (4 mg total) by mouth every 6 (six) hours.    Dispense:  12 tablet    Refill:  0    Order Specific Question:   Supervising Provider    Answer:   MILLER, BRIAN [[id number]]  . dicyclomine (BENTYL) 20 MG tablet    Sig: Take 1 tablet (20 mg total) by mouth 2 (two) times daily.    Dispense:  10 tablet    Refill:  0    Order Specific Question:   Supervising Provider    Answer:   Noemi Chapel [[id number]]  An After Visit Summary was printed and given to the patient.   Fransico Meadow, PA-C 06/25/16 Bruno, PA-C 06/25/16 0032    Merrily Pew, MD 06/25/16 1723

## 2016-06-24 NOTE — Discharge Instructions (Signed)
You can take naproxen for pain. If you need something stronger for pain, you will need to discuss it with your doctor who prescribes your suboxone. Consider recheck by a gynecologist again.

## 2016-06-25 MED ORDER — TRAMADOL HCL 50 MG PO TABS: 50.0000 mg | ORAL_TABLET | Freq: Four times a day (QID) | ORAL | 0 refills | Status: DC | PRN

## 2016-06-25 MED ORDER — ONDANSETRON HCL 4 MG PO TABS: 4.0000 mg | ORAL_TABLET | Freq: Four times a day (QID) | ORAL | 0 refills | Status: DC

## 2016-06-25 MED ORDER — DICYCLOMINE HCL 20 MG PO TABS: 20.0000 mg | ORAL_TABLET | Freq: Two times a day (BID) | ORAL | 0 refills | Status: DC

## 2016-06-25 NOTE — ED Notes (Signed)
Pt alert & oriented x4, stable gait. Patient given discharge instructions, paperwork & prescription(s). Patient  instructed to stop at the registration desk to finish any additional paperwork. Patient verbalized understanding. Pt left department w/ no further questions. 

## 2016-06-25 NOTE — ED Notes (Signed)
Pt states she has a ride on the way.

## 2016-06-25 NOTE — Discharge Instructions (Signed)
See your Physician for recheck.  °

## 2016-07-23 ENCOUNTER — Emergency Department (HOSPITAL_COMMUNITY)
Admission: EM | Admit: 2016-07-23 | Discharge: 2016-07-24 | Disposition: A | Discharge status: Home / Self Care | Payer: Self-pay | Attending: Emergency Medicine | Admitting: Emergency Medicine

## 2016-07-23 ENCOUNTER — Encounter (HOSPITAL_COMMUNITY): Payer: Self-pay | Admitting: *Deleted

## 2016-07-23 DIAGNOSIS — J45909 Unspecified asthma, uncomplicated: Secondary | ICD-10-CM | POA: Insufficient documentation

## 2016-07-23 DIAGNOSIS — F17210 Nicotine dependence, cigarettes, uncomplicated: Secondary | ICD-10-CM | POA: Insufficient documentation

## 2016-07-23 DIAGNOSIS — Z79899 Other long term (current) drug therapy: Secondary | ICD-10-CM | POA: Insufficient documentation

## 2016-07-23 DIAGNOSIS — R102 Pelvic and perineal pain: Secondary | ICD-10-CM | POA: Insufficient documentation

## 2016-07-23 DIAGNOSIS — R1030 Lower abdominal pain, unspecified: Secondary | ICD-10-CM | POA: Insufficient documentation

## 2016-07-23 LAB — URINALYSIS, ROUTINE W REFLEX MICROSCOPIC
Bilirubin Urine: NEGATIVE
Glucose, UA: NEGATIVE mg/dL
Hgb urine dipstick: NEGATIVE
Ketones, ur: NEGATIVE mg/dL
Leukocytes, UA: NEGATIVE
Nitrite: NEGATIVE
Protein, ur: NEGATIVE mg/dL
Specific Gravity, Urine: 1.019 (ref 1.005–1.030)
pH: 6 (ref 5.0–8.0)

## 2016-07-23 LAB — PREGNANCY, URINE: Preg Test, Ur: NEGATIVE

## 2016-07-23 MED ORDER — HYDROCODONE-ACETAMINOPHEN 5-325 MG PO TABS
1.0000 | ORAL_TABLET | Freq: Once | ORAL | Status: AC
  Administered 2016-07-23: 1 via ORAL
  Filled 2016-07-23: qty 1

## 2016-07-23 MED ORDER — ONDANSETRON 4 MG PO TBDP
4.0000 mg | ORAL_TABLET | Freq: Once | ORAL | Status: AC
  Administered 2016-07-23: 4 mg via ORAL
  Filled 2016-07-23: qty 1

## 2016-07-23 NOTE — ED Triage Notes (Signed)
Pt c/o lower abd pain that started a few days with nausea, denies any vomiting or fever, has hx of ovarian cyst,

## 2016-07-23 NOTE — ED Notes (Signed)
Pt ambulated to room without difficultly and instructed to get urine sample.

## 2016-07-23 NOTE — ED Provider Notes (Signed)
[facility] DEPT Provider Note   CSN: [id number] Arrival date & time: 07/23/16  1901  By signing my name below, I, Ethelle Lyon Long, attest that this documentation has been prepared under the direction and in the presence of Ezequiel Essex, MD. Electronically Signed: Ethelle Lyon Long, Scribe. 07/23/2016. 11:18 PM.   History   Chief Complaint Chief Complaint  Patient presents with  . Abdominal Pain   The history is provided by the patient and medical records. No language interpreter was used.    HPI Comments:  [name] is a 31 y.o. female with a PMhx of Endometriosis, prior Renal Calculi, Asthma, Anxiety, who presents to the Emergency Department complaining of constant, gradually worsening, radiating, suprapubic abdominal pain onset two days ago. Pt reports this pain arising two days ago, being constant throughout this time and worsening since onset leading her to be seen tonight at [facility]. She has not had this type of pain before and states she has not had this severe of pain since her hysterectomy. Today feels dissimilar from prior endometriosis pain. Pt has associated symptoms of reduced PO intake d/t pain, back pain and lateral abdominal pain radiating from the medial abdominal pain, vaginal pain, emesis x1, and diarrhea x1. Lying supine somewhat relieves the pain while direct pressure and palpation exacerbate it. She additionally reports she is sexually active with the same person and sexual intercourse is very painful for her. Pt denies vaginal discharge, vaginal bleeding, fever, and any other complaints at this time.   Past Medical History:  Diagnosis Date  . Anxiety   . Asthma   . Depression   . Endometriosis   . History of asthma    with bronchitis  . History of genital warts   . History of kidney stones    passed stones, no surgery required  . History of pelvic inflammatory disease   . Mood disorder (HCC)    UNSPECIFIED  . Pelvic pain in female   . SVD  (spontaneous vaginal delivery)     Patient Active Problem List   Diagnosis Date Noted  . Sprain of ankle 02/21/2015  . S/P hysterectomy 06/25/2014  . Pelvic pain in female 08/18/2013    Past Surgical History:  Procedure Laterality Date  . ABDOMINAL HYSTERECTOMY    . ABLATION COLPOCLESIS    . BILATERAL SALPINGECTOMY Bilateral 06/25/2014   Procedure: BILATERAL SALPINGECTOMY;  Surgeon: Cheri Fowler, MD;  Location: [facility];  Service: Gynecology;  Laterality: Bilateral;  . CYSTO WITH HYDRODISTENSION N/A 12/01/2013   Procedure: CYSTOSCOPY/HYDRODISTENSION with instillation of marcaine and pyridium;  Surgeon: Ardis Hughs, MD;  Location: [facility];  Service: Urology;  Laterality: N/A;  . CYSTOSCOPY N/A 06/25/2014   Procedure: CYSTOSCOPY;  Surgeon: Cheri Fowler, MD;  Location: [facility];  Service: Gynecology;  Laterality: N/A;  . DX LAPAROSCOPY/ ASPIRATION RIGHT OVARIAN CYST AND BX POSTERIOR CUL-DE-SAC  05-27-2005  . ESSURE TUBAL LIGATION Bilateral 2011  . LAPAROSCOPIC ASSISTED VAGINAL HYSTERECTOMY N/A 06/25/2014   Procedure: LAPAROSCOPIC ASSISTED VAGINAL HYSTERECTOMY;  Surgeon: Cheri Fowler, MD;  Location: [facility];  Service: Gynecology;  Laterality: N/A;  . LAPAROSCOPY N/A 08/18/2013   Procedure: LAPAROSCOPY DIAGNOSTIC, FULGERATION OF ENDOMETROSIS;  Surgeon: Cheri Fowler, MD;  Location: [facility];  Service: Gynecology;  Laterality: N/A;  . WISDOM TOOTH EXTRACTION      OB History    Gravida Para Term Preterm AB Living   2 2 2     2    SAB TAB Ectopic Multiple Live Births  Home Medications    Prior to Admission medications   Medication Sig Start Date End Date Taking? Authorizing Provider  ALPRAZolam Duanne Moron) 1 MG tablet Take 1 mg by mouth 3 (three) times daily. 05/25/16   Historical Provider, MD  cephALEXin (KEFLEX) 500 MG capsule Take 1 capsule (500 mg total) by mouth 4 (four) times daily. Patient not taking: Reported on 06/24/2016 06/01/16   Lily Kocher, PA-C  dicyclomine (BENTYL) 20 MG tablet Take 1 tablet (20 mg total) by mouth 2 (two) times daily. 06/25/16   Fransico Meadow, PA-C  gabapentin (NEURONTIN) 300 MG capsule Take 300 mg by mouth 3 (three) times daily.    Historical Provider, MD  nitrofurantoin, macrocrystal-monohydrate, (MACROBID) 100 MG capsule Take 1 capsule (100 mg total) by mouth 2 (two) times daily. X 7 days Patient not taking: Reported on 06/24/2016 06/13/16   Elnora Morrison, MD  ondansetron (ZOFRAN) 4 MG tablet Take 1 tablet (4 mg total) by mouth every 6 (six) hours. 06/25/16   Fransico Meadow, PA-C    Family History No family history on file.  Social History Social History  Substance Use Topics  . Smoking status: Current Every Day Smoker    Packs/day: 0.50    Years: 4.00    Types: Cigarettes    Last attempt to quit: 11/27/2009  . Smokeless tobacco: Never Used  . Alcohol use No     Allergies   Biaxin [clarithromycin]; Penicillins; and Toradol [ketorolac tromethamine]   Review of Systems Review of Systems All systems reviewed and are negative for acute change except as noted in the HPI.    Physical Exam Updated Vital Signs BP 110/62   Pulse 71   Temp 98.2 F (36.8 C) (Oral)   Resp 16   Ht 5\' 3"  (1.6 m)   Wt 165 lb (74.8 kg)   LMP 06/15/2014   SpO2 96%   BMI 29.23 kg/m   Physical Exam  Constitutional: She is oriented to person, place, and time. She appears well-developed and well-nourished. No distress.  Nontoxic appearing.  HENT:  Head: Normocephalic and atraumatic.  Mouth/Throat: Oropharynx is clear and moist. No oropharyngeal exudate.  Eyes: Conjunctivae and EOM are normal. Pupils are equal, round, and reactive to light.  Neck: Normal range of motion. Neck supple.  No meningismus.  Cardiovascular: Normal rate, regular rhythm, normal heart sounds and intact distal pulses.   No murmur heard. Pulmonary/Chest: Effort normal and breath sounds normal. No respiratory distress.  Abdominal: Soft.  There is tenderness in the suprapubic area. There is guarding. There is no rebound.  Suprapubic tenderness with voluntary guarding. Soft. No RLQ Tenderness.   Genitourinary: Pelvic exam was performed with patient supine. Right adnexum displays tenderness.  Genitourinary Comments: Cervix absent. Normal external genitalia. Midline suprapubic pain.    Musculoskeletal: Normal range of motion. She exhibits no edema or tenderness.  Neurological: She is alert and oriented to person, place, and time. No cranial nerve deficit. She exhibits normal muscle tone. Coordination normal.   5/5 strength throughout. CN 2-12 intact.Equal grip strength.   Skin: Skin is warm.  Psychiatric: She has a normal mood and affect. Her behavior is normal.  Nursing note and vitals reviewed.  FEMALE CHAPERONE PRESENT DURING PHYSICAL EXAMINATION  ED Treatments / Results  DIAGNOSTIC STUDIES:  Oxygen Saturation is 96% on RA, adequate by my interpretation.    COORDINATION OF CARE:  11:22 PM Discussed treatment plan with pt at bedside including anti-emetic, UA with pain medication, and pt agreed to  plan.  Labs (all labs ordered are listed, but only abnormal results are displayed) Labs Reviewed  WET PREP, GENITAL - Abnormal; Notable for the following:       Result Value   Clue Cells Wet Prep HPF POC PRESENT (*)    All other components within normal limits  COMPREHENSIVE METABOLIC PANEL - Abnormal; Notable for the following:    Total Protein 6.3 (*)    All other components within normal limits  PREGNANCY, URINE  URINALYSIS, ROUTINE W REFLEX MICROSCOPIC  CBC WITH DIFFERENTIAL/PLATELET  LIPASE, BLOOD  GC/CHLAMYDIA PROBE AMP () NOT AT [facility]    EKG  EKG Interpretation None       Radiology Ct Abdomen Pelvis W Contrast  Result Date: 07/24/2016 CLINICAL DATA:  Acute onset of worsening suprapubic abdominal pain. Initial encounter. EXAM: CT ABDOMEN AND PELVIS WITH CONTRAST TECHNIQUE: Multidetector CT  imaging of the abdomen and pelvis was performed using the standard protocol following bolus administration of intravenous contrast. CONTRAST:  178mL ISOVUE-300 IOPAMIDOL (ISOVUE-300) INJECTION 61% COMPARISON:  CT of the abdomen and pelvis performed 06/24/2016 FINDINGS: Lower chest: The visualized lung bases are grossly clear. The visualized portions of the mediastinum are unremarkable. Hepatobiliary: The liver is unremarkable in appearance. The gallbladder is unremarkable in appearance. The common bile duct remains normal in caliber. Pancreas: The pancreas is within normal limits. Spleen: The spleen is unremarkable in appearance. Adrenals/Urinary Tract: The adrenal glands are unremarkable in appearance. The kidneys are within normal limits. There is no evidence of hydronephrosis. No renal or ureteral stones are identified. No perinephric stranding is seen. Stomach/Bowel: The stomach is unremarkable in appearance. The small bowel is within normal limits. The appendix is normal in caliber, without evidence of appendicitis. The colon is unremarkable in appearance. Vascular/Lymphatic: The abdominal aorta is unremarkable in appearance. The inferior vena cava is grossly unremarkable. No retroperitoneal lymphadenopathy is seen. No pelvic sidewall lymphadenopathy is identified. Reproductive: The bladder is mildly distended and grossly unremarkable. The patient is status post hysterectomy. No suspicious adnexal masses are seen. Other: No additional soft tissue abnormalities are seen. Musculoskeletal: No acute osseous abnormalities are identified. The visualized musculature is unremarkable in appearance. IMPRESSION: Unremarkable contrast-enhanced CT of the abdomen and pelvis. Electronically Signed   By: Garald Balding M.D.   On: 07/24/2016 01:46    Procedures Procedures (including critical care time)  Medications Ordered in ED Medications  ondansetron (ZOFRAN-ODT) disintegrating tablet 4 mg (4 mg Oral Given 07/23/16  2336)  HYDROcodone-acetaminophen (NORCO/VICODIN) 5-325 MG per tablet 1 tablet (1 tablet Oral Given 07/23/16 2335)     Initial Impression / Assessment and Plan / ED Course  I have reviewed the triage vital signs and the nursing notes.  Pertinent labs & imaging results that were available during my care of the patient were reviewed by me and considered in my medical decision making (see chart for details).    Patient with history of kidney stones, endometriosis, chronic pelvic pain presenting with lower abdominal pain that is progressively worsening over the past 2 days. One episode of vomiting and one episode of diarrhea. Denies hematuria but does have pain with urination. No vaginal bleeding or discharge.  Abdomen is soft. Urinalysis is negative for infection or blood. Pregnancy negative.  Workup is reassuring. Urinalysis is negative. CT Scan is negative for acute pathology.  We'll arrange for patient to have pelvic ultrasound tomorrow given her persistent lower abdominal and suprapubic pain. Doubt ovarian torsion and this pain has been ongoing for the past  2 days and is mostly in the midline. Record review shows patient has had chronic pelvic pain in the past.  Patient will be referred to gynecology for further follow-up. She will return tomorrow for ultrasound of her ovaries. Return precautions discussed.  Final Clinical Impressions(s) / ED Diagnoses   Final diagnoses:  Pelvic pain    New Prescriptions New Prescriptions   No medications on file   I personally performed the services described in this documentation, which was scribed in my presence. The recorded information has been reviewed and is accurate.     Ezequiel Essex, MD 07/24/16 [phone]

## 2016-07-24 ENCOUNTER — Ambulatory Visit (HOSPITAL_COMMUNITY): Payer: Self-pay

## 2016-07-24 ENCOUNTER — Ambulatory Visit (HOSPITAL_COMMUNITY): Admit: 2016-07-24 | Payer: Self-pay

## 2016-07-24 ENCOUNTER — Emergency Department (HOSPITAL_COMMUNITY): Payer: Self-pay

## 2016-07-24 LAB — CBC WITH DIFFERENTIAL/PLATELET
Basophils Absolute: 0 10*3/uL (ref 0.0–0.1)
Basophils Relative: 0 %
Eosinophils Absolute: 0.1 10*3/uL (ref 0.0–0.7)
Eosinophils Relative: 1 %
HCT: 39.4 % (ref 36.0–46.0)
Hemoglobin: 13.4 g/dL (ref 12.0–15.0)
Lymphocytes Relative: 39 %
Lymphs Abs: 3 10*3/uL (ref 0.7–4.0)
MCH: 34 pg (ref 26.0–34.0)
MCHC: 34 g/dL (ref 30.0–36.0)
MCV: 100 fL (ref 78.0–100.0)
Monocytes Absolute: 0.4 10*3/uL (ref 0.1–1.0)
Monocytes Relative: 5 %
Neutro Abs: 4.2 10*3/uL (ref 1.7–7.7)
Neutrophils Relative %: 55 %
Platelets: 277 10*3/uL (ref 150–400)
RBC: 3.94 MIL/uL (ref 3.87–5.11)
RDW: 13.7 % (ref 11.5–15.5)
WBC: 7.7 10*3/uL (ref 4.0–10.5)

## 2016-07-24 LAB — COMPREHENSIVE METABOLIC PANEL
ALT: 18 U/L (ref 14–54)
AST: 19 U/L (ref 15–41)
Albumin: 3.7 g/dL (ref 3.5–5.0)
Alkaline Phosphatase: 45 U/L (ref 38–126)
Anion gap: 6 (ref 5–15)
BUN: 14 mg/dL (ref 6–20)
CO2: 27 mmol/L (ref 22–32)
Calcium: 9.3 mg/dL (ref 8.9–10.3)
Chloride: 104 mmol/L (ref 101–111)
Creatinine, Ser: 0.64 mg/dL (ref 0.44–1.00)
GFR calc Af Amer: >60 mL/min (ref >60)
GFR calc non Af Amer: >60 mL/min (ref >60)
Glucose, Bld: 94 mg/dL (ref 65–99)
Potassium: 3.9 mmol/L (ref 3.5–5.1)
Sodium: 137 mmol/L (ref 135–145)
Total Bilirubin: 0.3 mg/dL (ref 0.3–1.2)
Total Protein: 6.3 g/dL — ABNORMAL LOW (ref 6.5–8.1)

## 2016-07-24 LAB — WET PREP, GENITAL
Sperm: NONE SEEN
Trich, Wet Prep: NONE SEEN
WBC, Wet Prep HPF POC: NONE SEEN
Yeast Wet Prep HPF POC: NONE SEEN

## 2016-07-24 LAB — LIPASE, BLOOD: Lipase: 21 U/L (ref 11–51)

## 2016-07-24 MED ORDER — DICYCLOMINE HCL 20 MG PO TABS: 20.0000 mg | ORAL_TABLET | Freq: Two times a day (BID) | ORAL | 0 refills | Status: DC

## 2016-07-24 MED ORDER — ONDANSETRON HCL 4 MG PO TABS: 4.0000 mg | ORAL_TABLET | Freq: Three times a day (TID) | ORAL | 0 refills | Status: DC | PRN

## 2016-07-24 MED ORDER — IOPAMIDOL (ISOVUE-300) INJECTION 61%
100.0000 mL | Freq: Once | INTRAVENOUS | Status: AC | PRN
  Administered 2016-07-24: 100 mL via INTRAVENOUS

## 2016-07-24 MED ORDER — ONDANSETRON HCL 4 MG/2ML IJ SOLN
4.0000 mg | Freq: Once | INTRAMUSCULAR | Status: AC
  Administered 2016-07-24: 4 mg via INTRAVENOUS
  Filled 2016-07-24: qty 2

## 2016-07-24 MED ORDER — FENTANYL CITRATE (PF) 100 MCG/2ML IJ SOLN
50.0000 ug | Freq: Once | INTRAMUSCULAR | Status: AC
  Administered 2016-07-24: 50 ug via INTRAVENOUS
  Filled 2016-07-24: qty 2

## 2016-07-24 NOTE — ED Notes (Signed)
Patient has sprite, has been drinking and keeping fluids down

## 2016-07-24 NOTE — Discharge Instructions (Signed)
Follow up tomorrow for an ultrasound of your ovaries. Follow up with the gynecologist. Return to the ED if you develop new or worsening symptoms.

## 2016-07-27 ENCOUNTER — Ambulatory Visit (HOSPITAL_COMMUNITY): Admission: RE | Admit: 2016-07-27 | Payer: Self-pay | Source: Ambulatory Visit

## 2016-07-27 LAB — GC/CHLAMYDIA PROBE AMP (~~LOC~~) NOT AT ARMC
Chlamydia: POSITIVE — AB
Neisseria Gonorrhea: NEGATIVE

## 2016-07-28 ENCOUNTER — Encounter (HOSPITAL_COMMUNITY): Payer: Self-pay

## 2016-07-28 ENCOUNTER — Ambulatory Visit (HOSPITAL_COMMUNITY): Payer: Self-pay

## 2016-07-28 ENCOUNTER — Emergency Department (HOSPITAL_COMMUNITY)
Admission: EM | Admit: 2016-07-28 | Discharge: 2016-07-28 | Disposition: A | Discharge status: Home / Self Care | Payer: Self-pay | Attending: Emergency Medicine | Admitting: Emergency Medicine

## 2016-07-28 DIAGNOSIS — J45909 Unspecified asthma, uncomplicated: Secondary | ICD-10-CM | POA: Insufficient documentation

## 2016-07-28 DIAGNOSIS — F17210 Nicotine dependence, cigarettes, uncomplicated: Secondary | ICD-10-CM | POA: Insufficient documentation

## 2016-07-28 DIAGNOSIS — A749 Chlamydial infection, unspecified: Secondary | ICD-10-CM

## 2016-07-28 DIAGNOSIS — A5611 Chlamydial female pelvic inflammatory disease: Secondary | ICD-10-CM | POA: Insufficient documentation

## 2016-07-28 DIAGNOSIS — N730 Acute parametritis and pelvic cellulitis: Secondary | ICD-10-CM

## 2016-07-28 DIAGNOSIS — Z79899 Other long term (current) drug therapy: Secondary | ICD-10-CM | POA: Insufficient documentation

## 2016-07-28 MED ORDER — DOXYCYCLINE HYCLATE 100 MG PO CAPS: 100.0000 mg | ORAL_CAPSULE | Freq: Two times a day (BID) | ORAL | 0 refills | Status: DC

## 2016-07-28 NOTE — ED Triage Notes (Addendum)
Pt was called today and told she has chlamydia and is here to be treated.  Pt reports that boyfriend admitted to cheating on her with a girl from a dating site

## 2016-07-28 NOTE — ED Provider Notes (Signed)
[facility] DEPT Provider Note   CSN: [id number] Arrival date & time: 07/28/16  1721     History   Chief Complaint Chief Complaint  Patient presents with  . SEXUALLY TRANSMITTED DISEASE    HPI [name] is a 31 y.o. female.  [name] is a 31 y.o. Female who presents to the ED with a recent diagnosis of chlamydia. Patient was seen in the emergency department 5 days ago with some pelvic pain. Patient reports her pain has subsided, however she still has intermittent pain at times. She tested positive for chlamydia 5 days ago and returns for treatment. Gonorrhea testing was negative. She does report noticing some slight vaginal discharge after intercourse today. She tells me her boyfriend admitted to cheating on her. She does report a history of allergy to clarithromycin. She denies fevers, nausea, vomiting, diarrhea, or rashes.    The history is provided by the patient and medical records. No language interpreter was used.    Past Medical History:  Diagnosis Date  . Anxiety   . Asthma   . Depression   . Endometriosis   . History of asthma    with bronchitis  . History of genital warts   . History of kidney stones    passed stones, no surgery required  . History of pelvic inflammatory disease   . Mood disorder (HCC)    UNSPECIFIED  . Pelvic pain in female   . SVD (spontaneous vaginal delivery)     Patient Active Problem List   Diagnosis Date Noted  . Sprain of ankle 02/21/2015  . S/P hysterectomy 06/25/2014  . Pelvic pain in female 08/18/2013    Past Surgical History:  Procedure Laterality Date  . ABDOMINAL HYSTERECTOMY    . ABLATION COLPOCLESIS    . BILATERAL SALPINGECTOMY Bilateral 06/25/2014   Procedure: BILATERAL SALPINGECTOMY;  Surgeon: Cheri Fowler, MD;  Location: [facility];  Service: Gynecology;  Laterality: Bilateral;  . CYSTO WITH HYDRODISTENSION N/A 12/01/2013   Procedure: CYSTOSCOPY/HYDRODISTENSION with instillation of marcaine and pyridium;   Surgeon: Ardis Hughs, MD;  Location: [facility];  Service: Urology;  Laterality: N/A;  . CYSTOSCOPY N/A 06/25/2014   Procedure: CYSTOSCOPY;  Surgeon: Cheri Fowler, MD;  Location: [facility];  Service: Gynecology;  Laterality: N/A;  . DX LAPAROSCOPY/ ASPIRATION RIGHT OVARIAN CYST AND BX POSTERIOR CUL-DE-SAC  05-27-2005  . ESSURE TUBAL LIGATION Bilateral 2011  . LAPAROSCOPIC ASSISTED VAGINAL HYSTERECTOMY N/A 06/25/2014   Procedure: LAPAROSCOPIC ASSISTED VAGINAL HYSTERECTOMY;  Surgeon: Cheri Fowler, MD;  Location: [facility];  Service: Gynecology;  Laterality: N/A;  . LAPAROSCOPY N/A 08/18/2013   Procedure: LAPAROSCOPY DIAGNOSTIC, FULGERATION OF ENDOMETROSIS;  Surgeon: Cheri Fowler, MD;  Location: [facility];  Service: Gynecology;  Laterality: N/A;  . WISDOM TOOTH EXTRACTION      OB History    Gravida Para Term Preterm AB Living   2 2 2     2    SAB TAB Ectopic Multiple Live Births                   Home Medications    Prior to Admission medications   Medication Sig Start Date End Date Taking? Authorizing Provider  ALPRAZolam Duanne Moron) 1 MG tablet Take 1 mg by mouth 3 (three) times daily. 05/25/16   Historical Provider, MD  cephALEXin (KEFLEX) 500 MG capsule Take 1 capsule (500 mg total) by mouth 4 (four) times daily. Patient not taking: Reported on 06/24/2016 06/01/16   Lily Kocher, PA-C  dicyclomine (BENTYL) 20 MG tablet Take 1 tablet (20 mg total) by mouth 2 (two) times daily. 07/24/16   Ezequiel Essex, MD  doxycycline (VIBRAMYCIN) 100 MG capsule Take 1 capsule (100 mg total) by mouth 2 (two) times daily. 07/28/16   Waynetta Pean, PA-C  gabapentin (NEURONTIN) 300 MG capsule Take 300 mg by mouth 3 (three) times daily.    Historical Provider, MD  nitrofurantoin, macrocrystal-monohydrate, (MACROBID) 100 MG capsule Take 1 capsule (100 mg total) by mouth 2 (two) times daily. X 7 days Patient not taking: Reported on 06/24/2016 06/13/16   Elnora Morrison, MD  ondansetron [facility]) 4 MG  tablet Take 1 tablet (4 mg total) by mouth every 8 (eight) hours as needed for nausea or vomiting. 07/24/16   Ezequiel Essex, MD    Family History No family history on file.  Social History Social History  Substance Use Topics  . Smoking status: Current Every Day Smoker    Packs/day: 0.50    Years: 4.00    Types: Cigarettes    Last attempt to quit: 11/27/2009  . Smokeless tobacco: Never Used  . Alcohol use No     Allergies   Biaxin [clarithromycin]; Penicillins; and Toradol [ketorolac tromethamine]   Review of Systems Review of Systems  Constitutional: Negative for fever.  HENT: Negative for mouth sores.   Gastrointestinal: Positive for abdominal pain. Negative for abdominal distention, diarrhea, nausea and vomiting.  Genitourinary: Positive for vaginal discharge. Negative for frequency and vaginal bleeding.  Skin: Negative for rash.     Physical Exam Updated Vital Signs BP 128/89   Pulse 92   Temp 97.8 F (36.6 C) (Oral)   Resp 16   Ht 5\' 3"  (1.6 m)   Wt 73.9 kg   LMP 06/15/2014   SpO2 100%   BMI 28.87 kg/m   Physical Exam  Constitutional: She appears well-developed and well-nourished. No distress.  HENT:  Head: Normocephalic and atraumatic.  Mouth/Throat: Oropharynx is clear and moist.  Eyes: Right eye exhibits no discharge. Left eye exhibits no discharge.  Cardiovascular: Normal rate, regular rhythm and intact distal pulses.   Pulmonary/Chest: Effort normal. No respiratory distress.  Abdominal: Soft. She exhibits no distension and no mass. There is tenderness. There is no rebound and no guarding.  Abdomen is soft. Bowel sounds are present. Patient has mild suprapubic abdominal tenderness to palpation. No peritoneal signs.  Neurological: She is alert. Coordination normal.  Skin: Skin is warm and dry. Capillary refill takes less than 2 seconds. No rash noted. She is not diaphoretic. No erythema. No pallor.  Psychiatric: She has a normal mood and affect. Her  behavior is normal.  Nursing note and vitals reviewed.    ED Treatments / Results  Labs (all labs ordered are listed, but only abnormal results are displayed) Labs Reviewed - No data to display  EKG  EKG Interpretation None       Radiology No results found.  Procedures Procedures (including critical care time)  Medications Ordered in ED Medications - No data to display   Initial Impression / Assessment and Plan / ED Course  I have reviewed the triage vital signs and the nursing notes.  Pertinent labs & imaging results that were available during my care of the patient were reviewed by me and considered in my medical decision making (see chart for details).    This is a 31 y.o. Female who presents to the ED with a recent diagnosis of chlamydia. Patient was seen in the emergency department  5 days ago with some pelvic pain. Patient reports her pain has subsided, however she still has intermittent pain at times. She tested positive for chlamydia 5 days ago and returns for treatment. Gonorrhea testing was negative. She does report noticing some slight vaginal discharge after intercourse today. She tells me her boyfriend admitted to cheating on her. She does report a history of allergy to clarithromycin and PCN.  On exam patient is afebrile and nontoxic appearing. Her abdomen is soft and she has mild suprapubic abdominal tenderness to palpation. No peritoneal signs. Lab testing from 5 days ago reveals positive Chlamydia screen and negative gonorrhea screen. Suspect PID with patient's intermittent pelvic pain. CT abd/pelvis 5 days ago was unremarkable. Will treat her with doxycycline for 14 days. I encouraged her to follow-up with the health department for repeat STD testing. Discussed safe sex practices. I discussed strict and specific return precautions. I advised the patient to follow-up with their primary care provider this week. I advised the patient to return to the emergency  department with new or worsening symptoms or new concerns. The patient verbalized understanding and agreement with plan.    Final Clinical Impressions(s) / ED Diagnoses   Final diagnoses:  PID (acute pelvic inflammatory disease)  Chlamydia    New Prescriptions New Prescriptions   DOXYCYCLINE (VIBRAMYCIN) 100 MG CAPSULE    Take 1 capsule (100 mg total) by mouth 2 (two) times daily.     Waynetta Pean, PA-C 07/28/16 1815    Noemi Chapel, MD 07/30/16 [phone]

## 2016-07-28 NOTE — ED Notes (Signed)
Pt reports being told she has STD and needs tx, states partner has been with others as well.

## 2016-07-30 ENCOUNTER — Encounter (HOSPITAL_COMMUNITY): Payer: Self-pay | Admitting: Emergency Medicine

## 2016-07-30 ENCOUNTER — Emergency Department (HOSPITAL_COMMUNITY)
Admission: EM | Admit: 2016-07-30 | Discharge: 2016-07-30 | Disposition: A | Discharge status: Home / Self Care | Payer: Self-pay | Attending: Emergency Medicine | Admitting: Emergency Medicine

## 2016-07-30 ENCOUNTER — Ambulatory Visit (HOSPITAL_COMMUNITY): Payer: Self-pay

## 2016-07-30 DIAGNOSIS — F17210 Nicotine dependence, cigarettes, uncomplicated: Secondary | ICD-10-CM | POA: Insufficient documentation

## 2016-07-30 DIAGNOSIS — R531 Weakness: Secondary | ICD-10-CM | POA: Insufficient documentation

## 2016-07-30 DIAGNOSIS — J45909 Unspecified asthma, uncomplicated: Secondary | ICD-10-CM | POA: Insufficient documentation

## 2016-07-30 DIAGNOSIS — R1114 Bilious vomiting: Secondary | ICD-10-CM | POA: Insufficient documentation

## 2016-07-30 LAB — I-STAT CHEM 8, ED
BUN: 18 mg/dL (ref 6–20)
Calcium, Ion: 1.18 mmol/L (ref 1.15–1.40)
Chloride: 103 mmol/L (ref 101–111)
Creatinine, Ser: 0.7 mg/dL (ref 0.44–1.00)
Glucose, Bld: 96 mg/dL (ref 65–99)
HCT: 46 % (ref 36.0–46.0)
Hemoglobin: 15.6 g/dL — ABNORMAL HIGH (ref 12.0–15.0)
Potassium: 3.9 mmol/L (ref 3.5–5.1)
Sodium: 140 mmol/L (ref 135–145)
TCO2: 31 mmol/L (ref 0–100)

## 2016-07-30 LAB — I-STAT BETA HCG BLOOD, ED (MC, WL, AP ONLY): I-stat hCG, quantitative: <5 m[IU]/mL (ref <5)

## 2016-07-30 MED ORDER — ONDANSETRON 8 MG PO TBDP
8.0000 mg | ORAL_TABLET | Freq: Once | ORAL | Status: AC
  Administered 2016-07-30: 8 mg via ORAL
  Filled 2016-07-30: qty 1

## 2016-07-30 MED ORDER — LEVOFLOXACIN 500 MG PO TABS
500.0000 mg | ORAL_TABLET | Freq: Once | ORAL | Status: AC
  Administered 2016-07-30: 500 mg via ORAL
  Filled 2016-07-30: qty 1

## 2016-07-30 MED ORDER — LEVOFLOXACIN 500 MG PO TABS: 500.0000 mg | ORAL_TABLET | Freq: Every day | ORAL | 0 refills | Status: DC

## 2016-07-30 NOTE — ED Notes (Signed)
Patient given discharge instruction, verbalized understand. Patient ambulatory out of the department.  

## 2016-07-30 NOTE — ED Provider Notes (Signed)
[facility] DEPT Provider Note   CSN: [id number] Arrival date & time: 07/30/16  1405     History   Chief Complaint Chief Complaint  Patient presents with  . Emesis    HPI [name] is a 31 y.o. female.  HPI  Patient presents with concern of nausea, vomiting. She acknowledges a history of recent diagnosis of chlamydia, and initiated doxycycline therapy 3 days ago. She notes over the past day or so she has had persistent generalized discomfort, as well as nausea, vomiting without focal chest or abdominal pain. She also denies any fever, chills. Patient was seen here initially, had negative gonorrhea test, positive chlamydia test.   Past Medical History:  Diagnosis Date  . Anxiety   . Asthma   . Depression   . Endometriosis   . History of asthma    with bronchitis  . History of genital warts   . History of kidney stones    passed stones, no surgery required  . History of pelvic inflammatory disease   . Mood disorder (HCC)    UNSPECIFIED  . Pelvic pain in female   . SVD (spontaneous vaginal delivery)     Patient Active Problem List   Diagnosis Date Noted  . Sprain of ankle 02/21/2015  . S/P hysterectomy 06/25/2014  . Pelvic pain in female 08/18/2013    Past Surgical History:  Procedure Laterality Date  . ABDOMINAL HYSTERECTOMY    . ABLATION COLPOCLESIS    . BILATERAL SALPINGECTOMY Bilateral 06/25/2014   Procedure: BILATERAL SALPINGECTOMY;  Surgeon: Cheri Fowler, MD;  Location: [facility];  Service: Gynecology;  Laterality: Bilateral;  . CYSTO WITH HYDRODISTENSION N/A 12/01/2013   Procedure: CYSTOSCOPY/HYDRODISTENSION with instillation of marcaine and pyridium;  Surgeon: Ardis Hughs, MD;  Location: [facility];  Service: Urology;  Laterality: N/A;  . CYSTOSCOPY N/A 06/25/2014   Procedure: CYSTOSCOPY;  Surgeon: Cheri Fowler, MD;  Location: [facility];  Service: Gynecology;  Laterality: N/A;  . DX LAPAROSCOPY/ ASPIRATION RIGHT OVARIAN  CYST AND BX POSTERIOR CUL-DE-SAC  05-27-2005  . ESSURE TUBAL LIGATION Bilateral 2011  . LAPAROSCOPIC ASSISTED VAGINAL HYSTERECTOMY N/A 06/25/2014   Procedure: LAPAROSCOPIC ASSISTED VAGINAL HYSTERECTOMY;  Surgeon: Cheri Fowler, MD;  Location: [facility];  Service: Gynecology;  Laterality: N/A;  . LAPAROSCOPY N/A 08/18/2013   Procedure: LAPAROSCOPY DIAGNOSTIC, FULGERATION OF ENDOMETROSIS;  Surgeon: Cheri Fowler, MD;  Location: [facility];  Service: Gynecology;  Laterality: N/A;  . WISDOM TOOTH EXTRACTION      OB History    Gravida Para Term Preterm AB Living   2 2 2     2    SAB TAB Ectopic Multiple Live Births                   Home Medications    Prior to Admission medications   Medication Sig Start Date End Date Taking? Authorizing Provider  ALPRAZolam Duanne Moron) 1 MG tablet Take 1 mg by mouth 3 (three) times daily. 05/25/16   Historical Provider, MD  cephALEXin (KEFLEX) 500 MG capsule Take 1 capsule (500 mg total) by mouth 4 (four) times daily. Patient not taking: Reported on 06/24/2016 06/01/16   Lily Kocher, PA-C  dicyclomine (BENTYL) 20 MG tablet Take 1 tablet (20 mg total) by mouth 2 (two) times daily. 07/24/16   Ezequiel Essex, MD  doxycycline (VIBRAMYCIN) 100 MG capsule Take 1 capsule (100 mg total) by mouth 2 (two) times daily. 07/28/16   Waynetta Pean, PA-C  gabapentin (NEURONTIN) 300 MG capsule Take  300 mg by mouth 3 (three) times daily.    Historical Provider, MD  nitrofurantoin, macrocrystal-monohydrate, (MACROBID) 100 MG capsule Take 1 capsule (100 mg total) by mouth 2 (two) times daily. X 7 days Patient not taking: Reported on 06/24/2016 06/13/16   Elnora Morrison, MD  ondansetron [facility]) 4 MG tablet Take 1 tablet (4 mg total) by mouth every 8 (eight) hours as needed for nausea or vomiting. 07/24/16   Ezequiel Essex, MD    Family History No family history on file.  Social History Social History  Substance Use Topics  . Smoking status: Current Every Day Smoker    Packs/day: 0.50     Years: 4.00    Types: Cigarettes    Last attempt to quit: 11/27/2009  . Smokeless tobacco: Never Used  . Alcohol use No     Allergies   Biaxin [clarithromycin]; Penicillins; and Toradol [ketorolac tromethamine]   Review of Systems Review of Systems  Constitutional:       Per HPI, otherwise negative  HENT:       Per HPI, otherwise negative  Respiratory:       Per HPI, otherwise negative  Cardiovascular:       Per HPI, otherwise negative  Gastrointestinal: Positive for nausea and vomiting.  Endocrine:       Negative aside from HPI  Genitourinary:       Neg aside from HPI   Musculoskeletal:       Per HPI, otherwise negative  Skin: Negative.   Allergic/Immunologic: Negative for immunocompromised state.  Neurological: Positive for weakness. Negative for syncope.     Physical Exam Updated Vital Signs BP 128/83 (BP Location: Left Arm)   Pulse 78   Temp 97.8 F (36.6 C) (Oral)   Resp 18   Ht 5\' 3"  (1.6 m)   Wt 160 lb (72.6 kg)   LMP 06/15/2014   SpO2 100%   BMI 28.34 kg/m   Physical Exam  Constitutional: She is oriented to person, place, and time. She appears well-developed and well-nourished. No distress.  HENT:  Head: Normocephalic and atraumatic.  Eyes: Conjunctivae and EOM are normal.  Cardiovascular: Normal rate and regular rhythm.   Pulmonary/Chest: Effort normal and breath sounds normal. No stridor. No respiratory distress.  Abdominal: She exhibits no distension and no mass. There is no tenderness. There is no guarding.  Musculoskeletal: She exhibits no edema.  Neurological: She is alert and oriented to person, place, and time. No cranial nerve deficit.  Skin: Skin is warm and dry.  Psychiatric: She has a normal mood and affect.  Nursing note and vitals reviewed.    ED Treatments / Results  Labs (all labs ordered are listed, but only abnormal results are displayed) Labs Reviewed  I-STAT BETA HCG BLOOD, ED ([facility], [facility], [facility] ONLY)  I-STAT CHEM 8, ED     Chart review notable for recent evaluation for STD, with negative gonorrhea, positive Chlamydia results. Patient has 7 emergency department visits in the past 6 months.   Procedures Procedures (including critical care time)  Medications Ordered in ED Medications  ondansetron (ZOFRAN-ODT) disintegrating tablet 8 mg (8 mg Oral Given 07/30/16 1624)  levofloxacin (LEVAQUIN) tablet 500 mg (500 mg Oral Given 07/30/16 1624)     Initial Impression / Assessment and Plan / ED Course  I have reviewed the triage vital signs and the nursing notes.  Pertinent labs & imaging results that were available during my care of the patient were reviewed by me and considered in my  medical decision making (see chart for details).  In female presents with nausea, vomiting after recent initiation of doxycycline therapy. Here she is awake, alert, hemodynamically stable, speaking clearly. There is some suspicion for drug reaction of the patient's labs, vitals are reassuring. Patient started on Levaquin, provided a discount coupon so that she could be sure to obtain the medication. Patient subsequent discharged in stable condition with resources to follow-up in the community.  5:06 PM On repeat exam the patient is cuddling her boyfriend.  Final Clinical Impressions(s) / ED Diagnoses  Nausea and vomiting   Carmin Muskrat, MD 07/30/16 [phone]

## 2016-07-30 NOTE — ED Triage Notes (Signed)
Prior to arriving to ED today pt was in [employer] bathroom x1 hour throwing up, denies being around anyone sick.

## 2016-07-30 NOTE — Discharge Instructions (Signed)
As discussed, your evaluation today has been largely reassuring.  But, it is important that you monitor your condition carefully, and do not hesitate to return to the ED if you develop new, or concerning changes in your condition. ? ?Otherwise, please follow-up with your physician for appropriate ongoing care. ? ?

## 2016-08-20 ENCOUNTER — Emergency Department (HOSPITAL_COMMUNITY)
Admission: EM | Admit: 2016-08-20 | Discharge: 2016-08-21 | Disposition: A | Discharge status: Home / Self Care | Payer: Self-pay | Attending: Emergency Medicine | Admitting: Emergency Medicine

## 2016-08-20 DIAGNOSIS — F17210 Nicotine dependence, cigarettes, uncomplicated: Secondary | ICD-10-CM | POA: Insufficient documentation

## 2016-08-20 DIAGNOSIS — Z79899 Other long term (current) drug therapy: Secondary | ICD-10-CM | POA: Insufficient documentation

## 2016-08-20 DIAGNOSIS — R102 Pelvic and perineal pain: Secondary | ICD-10-CM | POA: Insufficient documentation

## 2016-08-20 DIAGNOSIS — J45909 Unspecified asthma, uncomplicated: Secondary | ICD-10-CM | POA: Insufficient documentation

## 2016-08-20 NOTE — ED Provider Notes (Signed)
[facility] DEPT Provider Note   CSN: [id number] Arrival date & time: 08/20/16  2340  By signing my name below, I, Dora Sims, attest that this documentation has been prepared under the direction and in the presence of physician practitioner, Betsey Holiday, Gwenyth Allegra, MD. Electronically Signed: Dora Sims, Scribe. 08/20/2016. 11:56 PM.  History   Chief Complaint Chief Complaint  Patient presents with  . Vaginal Bleeding   The history is provided by the patient. No language interpreter was used.    HPI Comments: [name] is a 31 y.o. female who presents to the Emergency Department complaining of persistent vaginal bleeding beginning several hours ago. She reports some associated left-sided suprapubic pain. Patient reports she experienced an episode of similar symptoms 5 days ago that resolved on their own, and she notes her bleeding tonight is more significant than it was 5 days ago. No alleviating factors noted. Patient notes she has had recurrent UTI's within the last two months and has also developed a cyst on each ovary over this time span. She had an abdominal hysterectomy two years ago and reports no subsequent urologic or gastrologic problems prior to two months ago. No recent trauma to her vaginal area. Patient denies dysuria, hematuria, nausea, vomiting, fevers, chills, or any other associated symptoms.  Past Medical History:  Diagnosis Date  . Anxiety   . Asthma   . Depression   . Endometriosis   . History of asthma    with bronchitis  . History of genital warts   . History of kidney stones    passed stones, no surgery required  . History of pelvic inflammatory disease   . Mood disorder (HCC)    UNSPECIFIED  . Pelvic pain in female   . SVD (spontaneous vaginal delivery)     Patient Active Problem List   Diagnosis Date Noted  . Sprain of ankle 02/21/2015  . S/P hysterectomy 06/25/2014  . Pelvic pain in female 08/18/2013    Past Surgical History:    Procedure Laterality Date  . ABDOMINAL HYSTERECTOMY    . ABLATION COLPOCLESIS    . BILATERAL SALPINGECTOMY Bilateral 06/25/2014   Procedure: BILATERAL SALPINGECTOMY;  Surgeon: Cheri Fowler, MD;  Location: [facility];  Service: Gynecology;  Laterality: Bilateral;  . CYSTO WITH HYDRODISTENSION N/A 12/01/2013   Procedure: CYSTOSCOPY/HYDRODISTENSION with instillation of marcaine and pyridium;  Surgeon: Ardis Hughs, MD;  Location: [facility];  Service: Urology;  Laterality: N/A;  . CYSTOSCOPY N/A 06/25/2014   Procedure: CYSTOSCOPY;  Surgeon: Cheri Fowler, MD;  Location: [facility];  Service: Gynecology;  Laterality: N/A;  . DX LAPAROSCOPY/ ASPIRATION RIGHT OVARIAN CYST AND BX POSTERIOR CUL-DE-SAC  05-27-2005  . ESSURE TUBAL LIGATION Bilateral 2011  . LAPAROSCOPIC ASSISTED VAGINAL HYSTERECTOMY N/A 06/25/2014   Procedure: LAPAROSCOPIC ASSISTED VAGINAL HYSTERECTOMY;  Surgeon: Cheri Fowler, MD;  Location: [facility];  Service: Gynecology;  Laterality: N/A;  . LAPAROSCOPY N/A 08/18/2013   Procedure: LAPAROSCOPY DIAGNOSTIC, FULGERATION OF ENDOMETROSIS;  Surgeon: Cheri Fowler, MD;  Location: [facility];  Service: Gynecology;  Laterality: N/A;  . WISDOM TOOTH EXTRACTION      OB History    Gravida Para Term Preterm AB Living   2 2 2     2    SAB TAB Ectopic Multiple Live Births                   Home Medications    Prior to Admission medications   Medication Sig Start Date End Date Taking? Authorizing Provider  ALPRAZolam (XANAX) 1 MG tablet Take 1 mg by mouth 3 (three) times daily. 05/25/16   [provider]  cephALEXin (KEFLEX) 500 MG capsule Take 1 capsule (500 mg total) by mouth 4 (four) times daily. Patient not taking: Reported on 06/24/2016 06/01/16   Lily Kocher, PA-C  dicyclomine (BENTYL) 20 MG tablet Take 1 tablet (20 mg total) by mouth 2 (two) times daily. 07/24/16   Rancour, Annie Main, MD  doxycycline (VIBRAMYCIN) 100 MG capsule Take 1 capsule (100 mg total) by mouth 2  (two) times daily. 07/28/16   Waynetta Pean, PA-C  gabapentin (NEURONTIN) 300 MG capsule Take 300 mg by mouth 3 (three) times daily.    [provider]  levofloxacin (LEVAQUIN) 500 MG tablet Take 1 tablet (500 mg total) by mouth daily. 07/30/16   Carmin Muskrat, MD  nitrofurantoin, macrocrystal-monohydrate, (MACROBID) 100 MG capsule Take 1 capsule (100 mg total) by mouth 2 (two) times daily. X 7 days Patient not taking: Reported on 06/24/2016 06/13/16   Elnora Morrison, MD  ondansetron (ZOFRAN) 4 MG tablet Take 1 tablet (4 mg total) by mouth every 8 (eight) hours as needed for nausea or vomiting. 07/24/16   Ezequiel Essex, MD    Family History No family history on file.  Social History Social History  Substance Use Topics  . Smoking status: Current Every Day Smoker    Packs/day: 0.50    Years: 4.00    Types: Cigarettes    Last attempt to quit: 11/27/2009  . Smokeless tobacco: Never Used  . Alcohol use No     Allergies   Biaxin [clarithromycin]; Penicillins; and Toradol [ketorolac tromethamine]   Review of Systems Review of Systems  Constitutional: Negative for chills and fever.  Gastrointestinal: Positive for abdominal pain. Negative for nausea and vomiting.  Genitourinary: Positive for vaginal bleeding. Negative for dysuria and hematuria.  All other systems reviewed and are negative.  Physical Exam Updated Vital Signs BP 123/69 (BP Location: Right Arm)   Pulse 98   Temp 97.7 F (36.5 C) (Oral)   Resp 17   Ht 5\' 3"  (1.6 m)   Wt 160 lb (72.6 kg)   LMP 06/15/2014   SpO2 100%   BMI 28.34 kg/m   Physical Exam  Constitutional: She is oriented to person, place, and time. She appears well-developed and well-nourished. No distress.  HENT:  Head: Normocephalic and atraumatic.  Right Ear: Hearing normal.  Left Ear: Hearing normal.  Nose: Nose normal.  Mouth/Throat: Oropharynx is clear and moist and mucous membranes are normal.  Eyes: Conjunctivae and EOM are  normal. Pupils are equal, round, and reactive to light.  Neck: Normal range of motion. Neck supple.  Cardiovascular: Regular rhythm, S1 normal and S2 normal.  Exam reveals no gallop and no friction rub.   No murmur heard. Pulmonary/Chest: Effort normal and breath sounds normal. No respiratory distress. She exhibits no tenderness.  Abdominal: Soft. Normal appearance and bowel sounds are normal. There is no hepatosplenomegaly. There is tenderness. There is no rebound, no guarding, no tenderness at McBurney's point and negative Murphy's sign. No hernia.  Left lower tenderness without guarding or rebound.  Genitourinary: Rectum normal and vagina normal. Rectal exam shows guaiac negative stool. Right adnexum displays no mass and no tenderness. Left adnexum displays tenderness. Left adnexum displays no mass. No bleeding in the vagina. No signs of injury around the vagina.  Musculoskeletal: Normal range of motion.  Neurological: She is alert and oriented to person, place, and time. She has normal  strength. No cranial nerve deficit or sensory deficit. Coordination normal. GCS eye subscore is 4. GCS verbal subscore is 5. GCS motor subscore is 6.  Skin: Skin is warm, dry and intact. No rash noted. No cyanosis.  Psychiatric: She has a normal mood and affect. Her speech is normal and behavior is normal. Thought content normal.  Nursing note and vitals reviewed.  ED Treatments / Results  Labs (all labs ordered are listed, but only abnormal results are displayed) Labs Reviewed  URINALYSIS, ROUTINE W REFLEX MICROSCOPIC - Abnormal; Notable for the following:       Result Value   APPearance HAZY (*)    Specific Gravity, Urine 1.031 (*)    Ketones, ur 5 (*)    Protein, ur 30 (*)    Bacteria, UA RARE (*)    Squamous Epithelial / LPF 0-5 (*)    All other components within normal limits    EKG  EKG Interpretation None       Radiology No results found.  Procedures Procedures (including critical  care time)  DIAGNOSTIC STUDIES: Oxygen Saturation is 100% on RA, normal by my interpretation.    COORDINATION OF CARE: 12:03 AM Discussed treatment plan with pt at bedside and pt agreed to plan.  Medications Ordered in ED Medications  oxyCODONE-acetaminophen (PERCOCET/ROXICET) 5-325 MG per tablet 1 tablet (1 tablet Oral Given 08/21/16 0037)     Initial Impression / Assessment and Plan / ED Course  I have reviewed the triage vital signs and the nursing notes.  Pertinent labs & imaging results that were available during my care of the patient were reviewed by me and considered in my medical decision making (see chart for details).     Patient reports that she has been experiencing abdominal pain and cramping associated with vaginal bleeding. She reports previous hysterectomy. Here in the ER there has not been any bleeding. She had noticed blood when she went to the bathroom and also in her panties earlier today. Rectal exam was heme-negative. Speculum exam revealed normal vaginal exam with normal vaginal cuff, no evidence of ulceration, laceration, infection or bleeding. Urinalysis did not show evidence of infection or hematuria, was a catheterized specimen. Doubt hemorrhagic cystitis or kidney stone at this point. Source of the bleeding is unclear at this time. Patient reassured, will give analgesia and have follow-up with OB/GYN for recheck. Return if symptoms worsen.  Final Clinical Impressions(s) / ED Diagnoses   Final diagnoses:  Pelvic pain in female    New Prescriptions New Prescriptions   No medications on file   I personally performed the services described in this documentation, which was scribed in my presence. The recorded information has been reviewed and is accurate.    Orpah Greek, MD 08/21/16 0110

## 2016-08-21 ENCOUNTER — Encounter (HOSPITAL_COMMUNITY): Payer: Self-pay

## 2016-08-21 LAB — URINALYSIS, ROUTINE W REFLEX MICROSCOPIC
Bilirubin Urine: NEGATIVE
Glucose, UA: NEGATIVE mg/dL
Hgb urine dipstick: NEGATIVE
Ketones, ur: 5 mg/dL — AB
Leukocytes, UA: NEGATIVE
Nitrite: NEGATIVE
Protein, ur: 30 mg/dL — AB
Specific Gravity, Urine: 1.031 — ABNORMAL HIGH (ref 1.005–1.030)
pH: 5 (ref 5.0–8.0)

## 2016-08-21 MED ORDER — OXYCODONE-ACETAMINOPHEN 5-325 MG PO TABS
1.0000 | ORAL_TABLET | Freq: Once | ORAL | Status: AC
  Administered 2016-08-21: 1 via ORAL
  Filled 2016-08-21: qty 1

## 2016-08-21 MED ORDER — HYDROCODONE-ACETAMINOPHEN 5-325 MG PO TABS: 1.0000 | ORAL_TABLET | ORAL | 0 refills | Status: DC | PRN

## 2016-08-21 NOTE — ED Triage Notes (Signed)
Pt reports being diagnosed with endometriosis about 2 years ago and had hysterectomy, but still has ovaries. Pt reports to be symptom free until last Saturday when she started having abd pain and bleeding. This subsided and started again today.

## 2016-08-25 DIAGNOSIS — N898 Other specified noninflammatory disorders of vagina: Secondary | ICD-10-CM | POA: Insufficient documentation

## 2016-08-25 DIAGNOSIS — N941 Unspecified dyspareunia: Secondary | ICD-10-CM | POA: Insufficient documentation

## 2016-08-25 DIAGNOSIS — M545 Low back pain: Secondary | ICD-10-CM | POA: Insufficient documentation

## 2016-08-25 DIAGNOSIS — Z79899 Other long term (current) drug therapy: Secondary | ICD-10-CM | POA: Insufficient documentation

## 2016-08-25 DIAGNOSIS — F17210 Nicotine dependence, cigarettes, uncomplicated: Secondary | ICD-10-CM | POA: Insufficient documentation

## 2016-08-25 DIAGNOSIS — J45909 Unspecified asthma, uncomplicated: Secondary | ICD-10-CM | POA: Insufficient documentation

## 2016-08-25 DIAGNOSIS — R102 Pelvic and perineal pain: Secondary | ICD-10-CM | POA: Insufficient documentation

## 2016-08-26 ENCOUNTER — Emergency Department (HOSPITAL_COMMUNITY)
Admission: EM | Admit: 2016-08-26 | Discharge: 2016-08-26 | Disposition: A | Discharge status: Home / Self Care | Payer: Self-pay | Attending: Emergency Medicine | Admitting: Emergency Medicine

## 2016-08-26 ENCOUNTER — Encounter (HOSPITAL_COMMUNITY): Payer: Self-pay | Admitting: *Deleted

## 2016-08-26 DIAGNOSIS — R102 Pelvic and perineal pain: Secondary | ICD-10-CM

## 2016-08-26 LAB — URINALYSIS, ROUTINE W REFLEX MICROSCOPIC
Bilirubin Urine: NEGATIVE
Glucose, UA: NEGATIVE mg/dL
Hgb urine dipstick: NEGATIVE
Ketones, ur: NEGATIVE mg/dL
Leukocytes, UA: NEGATIVE
Nitrite: NEGATIVE
Protein, ur: NEGATIVE mg/dL
Specific Gravity, Urine: 1.017 (ref 1.005–1.030)
pH: 6 (ref 5.0–8.0)

## 2016-08-26 LAB — POC URINE PREG, ED: Preg Test, Ur: NEGATIVE

## 2016-08-26 MED ORDER — HYDROCODONE-ACETAMINOPHEN 5-325 MG PO TABS
1.0000 | ORAL_TABLET | Freq: Once | ORAL | Status: AC
  Administered 2016-08-26: 1 via ORAL
  Filled 2016-08-26: qty 1

## 2016-08-26 NOTE — ED Provider Notes (Signed)
[facility] [facility] DEPT Provider Note   CSN: [id number] Arrival date & time: 08/25/16  2152  By signing my name below, I, Marcello Moores, attest that this documentation has been prepared under the direction and in the presence of Ripley Fraise, MD. Electronically Signed: Marcello Moores, ED Scribe. 08/26/16. 1:35 AM.   History   Chief Complaint Chief Complaint  Patient presents with  . Abdominal Pain    The history is provided by the patient. No language interpreter was used.  Abdominal Pain   The current episode started more than 1 week ago. The problem occurs constantly. The problem has not changed since onset.The pain is associated with an unknown factor. The pain is located in the RLQ, LLQ and suprapubic region. The pain is moderate. Pertinent negatives include dysuria. Nothing aggravates the symptoms. Nothing relieves the symptoms. Past workup includes surgery.  HPI Comments: [name] is a 31 y.o. female, with a PSHx of a partial hysterectomy in 2012, presents to the Emergency Department complaining of sharp waxing and waning abdominal pain that began approximately 11 days ago. Pt has associated intermittent spotting, vaginal discharge and radiating lower back pain that began at the same time. The pt states that she is unable to eat or sleep comfortably. She also has been taking ibuprofen with inadequate relief. The pt has a PMHx of multiple ovarian cysts and reports severe pain within that region causing a loss of blood. She was ultimately sent home from work during that episode. She also has pelvic floor dysfunction and hydrodistension of the bladder. Her PSHx include multiple laparoscopies. The pt reports a recent BM (5-6 hours ago). The pt states that she does not have health insurance. The pt denies dysuria, and difficulty urinating.    Past Medical History:  Diagnosis Date  . Anxiety   . Asthma   . Depression   . Endometriosis   . History of asthma    with bronchitis    . History of genital warts   . History of kidney stones    passed stones, no surgery required  . History of pelvic inflammatory disease   . Mood disorder (HCC)    UNSPECIFIED  . Pelvic pain in female   . SVD (spontaneous vaginal delivery)     Patient Active Problem List   Diagnosis Date Noted  . Sprain of ankle 02/21/2015  . S/P hysterectomy 06/25/2014  . Pelvic pain in female 08/18/2013    Past Surgical History:  Procedure Laterality Date  . ABDOMINAL HYSTERECTOMY    . ABLATION COLPOCLESIS    . BILATERAL SALPINGECTOMY Bilateral 06/25/2014   Procedure: BILATERAL SALPINGECTOMY;  Surgeon: Cheri Fowler, MD;  Location: [facility];  Service: Gynecology;  Laterality: Bilateral;  . CYSTO WITH HYDRODISTENSION N/A 12/01/2013   Procedure: CYSTOSCOPY/HYDRODISTENSION with instillation of marcaine and pyridium;  Surgeon: Ardis Hughs, MD;  Location: [facility];  Service: Urology;  Laterality: N/A;  . CYSTOSCOPY N/A 06/25/2014   Procedure: CYSTOSCOPY;  Surgeon: Cheri Fowler, MD;  Location: [facility];  Service: Gynecology;  Laterality: N/A;  . DX LAPAROSCOPY/ ASPIRATION RIGHT OVARIAN CYST AND BX POSTERIOR CUL-DE-SAC  05-27-2005  . ESSURE TUBAL LIGATION Bilateral 2011  . LAPAROSCOPIC ASSISTED VAGINAL HYSTERECTOMY N/A 06/25/2014   Procedure: LAPAROSCOPIC ASSISTED VAGINAL HYSTERECTOMY;  Surgeon: Cheri Fowler, MD;  Location: [facility];  Service: Gynecology;  Laterality: N/A;  . LAPAROSCOPY N/A 08/18/2013   Procedure: LAPAROSCOPY DIAGNOSTIC, FULGERATION OF ENDOMETROSIS;  Surgeon: Cheri Fowler, MD;  Location: [facility];  Service: Gynecology;  Laterality: N/A;  . WISDOM TOOTH EXTRACTION      OB History    Gravida Para Term Preterm AB Living   2 2 2     2    SAB TAB Ectopic Multiple Live Births                   Home Medications    Prior to Admission medications   Medication Sig Start Date End Date Taking? Authorizing Provider  ALPRAZolam Duanne Moron) 1 MG tablet Take 1 mg by mouth 3  (three) times daily. 05/25/16   [provider]  cephALEXin (KEFLEX) 500 MG capsule Take 1 capsule (500 mg total) by mouth 4 (four) times daily. Patient not taking: Reported on 06/24/2016 06/01/16   Lily Kocher, PA-C  dicyclomine (BENTYL) 20 MG tablet Take 1 tablet (20 mg total) by mouth 2 (two) times daily. 07/24/16   Rancour, Annie Main, MD  doxycycline (VIBRAMYCIN) 100 MG capsule Take 1 capsule (100 mg total) by mouth 2 (two) times daily. 07/28/16   Waynetta Pean, PA-C  gabapentin (NEURONTIN) 300 MG capsule Take 300 mg by mouth 3 (three) times daily.    [provider]  HYDROcodone-acetaminophen (NORCO/VICODIN) 5-325 MG tablet Take 1-2 tablets by mouth every 4 (four) hours as needed. 08/21/16   Orpah Greek, MD  levofloxacin (LEVAQUIN) 500 MG tablet Take 1 tablet (500 mg total) by mouth daily. 07/30/16   Carmin Muskrat, MD  nitrofurantoin, macrocrystal-monohydrate, (MACROBID) 100 MG capsule Take 1 capsule (100 mg total) by mouth 2 (two) times daily. X 7 days Patient not taking: Reported on 06/24/2016 06/13/16   Elnora Morrison, MD  ondansetron (ZOFRAN) 4 MG tablet Take 1 tablet (4 mg total) by mouth every 8 (eight) hours as needed for nausea or vomiting. 07/24/16   Ezequiel Essex, MD    Family History No family history on file.  Social History Social History  Substance Use Topics  . Smoking status: Current Every Day Smoker    Packs/day: 0.50    Years: 4.00    Types: Cigarettes    Last attempt to quit: 11/27/2009  . Smokeless tobacco: Never Used  . Alcohol use No     Allergies   Biaxin [clarithromycin]; Penicillins; and Toradol [ketorolac tromethamine]   Review of Systems Review of Systems  Gastrointestinal: Positive for abdominal pain.  Genitourinary: Positive for dyspareunia, vaginal discharge and vaginal pain. Negative for difficulty urinating and dysuria.  Musculoskeletal: Positive for back pain.  All other systems reviewed and are  negative.    Physical Exam Updated Vital Signs BP 116/78   Pulse 68   Temp 97.8 F (36.6 C) (Oral)   Resp 20   Ht 5\' 3"  (1.6 m)   Wt 165 lb (74.8 kg)   LMP 06/15/2014   SpO2 96%   BMI 29.23 kg/m   Physical Exam CONSTITUTIONAL: Well developed/well nourished HEAD: Normocephalic/atraumatic EYES: EOMI/PERRL ENMT: Mucous membranes moist NECK: supple no meningeal signs SPINE/BACK:entire spine nontender CV: S1/S2 noted, no murmurs/rubs/gallops noted LUNGS: Lungs are clear to auscultation bilaterally, no apparent distress ABDOMEN: soft, nontender, no rebound or guarding, bowel sounds noted throughout abdomen GU:no cva tenderness NEURO: Pt is awake/alert/appropriate, moves all extremitiesx4.  No facial droop.   EXTREMITIES: pulses normal/equal, full ROM SKIN: warm, color normal PSYCH: no abnormalities of mood noted, alert and oriented to situation   ED Treatments / Results   DIAGNOSTIC STUDIES: Oxygen Saturation is 96% on RA, adequate by my interpretation.   COORDINATION OF CARE: 12:58 AM-Discussed next steps with  pt. Pt verbalized understanding and is agreeable with the plan.   Labs (all labs ordered are listed, but only abnormal results are displayed) Labs Reviewed  URINALYSIS, ROUTINE W REFLEX MICROSCOPIC - Abnormal; Notable for the following:       Result Value   APPearance HAZY (*)    All other components within normal limits  POC URINE PREG, ED    EKG  EKG Interpretation None       Radiology No results found.  Procedures Procedures (including critical care time)  Medications Ordered in ED Medications - No data to display   Initial Impression / Assessment and Plan / ED Course  I have reviewed the triage vital signs and the nursing notes.  Pertinent labs  results that were available during my care of the patient were reviewed by me and considered in my medical decision making (see chart for details).     Pt stable Well appearing No focal BD  tenderness This appears to be chronic Denies current vag bleeding She just had pelvic exam on previous ED visit Will defer D/c home  Final Clinical Impressions(s) / ED Diagnoses   Final diagnoses:  Pelvic pain    New Prescriptions New Prescriptions   No medications on file   I personally performed the services described in this documentation, which was scribed in my presence. The recorded information has been reviewed and is accurate.         Ripley Fraise, MD 08/26/16 [phone]

## 2016-08-26 NOTE — ED Notes (Signed)
Pt alert & oriented x4, stable gait. Patient given discharge instructions, paperwork & prescription(s). Patient  instructed to stop at the registration desk to finish any additional paperwork. Patient verbalized understanding. Pt left department w/ no further questions. 

## 2016-08-26 NOTE — ED Triage Notes (Signed)
Abdominal pain

## 2016-08-26 NOTE — ED Notes (Signed)
Pt says was seen in the ER a month ago for vaginal bleeding. Has been unable to see OB provider. Pt says no one told her about the scheduled ultrasound, so not done. Pt c/o continued pain in pelvic & abdomen.

## 2016-08-29 ENCOUNTER — Encounter (HOSPITAL_COMMUNITY): Payer: Self-pay | Admitting: *Deleted

## 2016-08-29 ENCOUNTER — Emergency Department (HOSPITAL_COMMUNITY)
Admission: EM | Admit: 2016-08-29 | Discharge: 2016-08-30 | Disposition: A | Discharge status: Home / Self Care | Payer: Self-pay | Attending: Emergency Medicine | Admitting: Emergency Medicine

## 2016-08-29 DIAGNOSIS — F17210 Nicotine dependence, cigarettes, uncomplicated: Secondary | ICD-10-CM | POA: Insufficient documentation

## 2016-08-29 DIAGNOSIS — J45909 Unspecified asthma, uncomplicated: Secondary | ICD-10-CM | POA: Insufficient documentation

## 2016-08-29 DIAGNOSIS — R1032 Left lower quadrant pain: Secondary | ICD-10-CM | POA: Insufficient documentation

## 2016-08-29 DIAGNOSIS — N939 Abnormal uterine and vaginal bleeding, unspecified: Secondary | ICD-10-CM | POA: Insufficient documentation

## 2016-08-29 MED ORDER — ONDANSETRON 4 MG PO TBDP
4.0000 mg | ORAL_TABLET | Freq: Once | ORAL | Status: AC
  Administered 2016-08-29: 4 mg via ORAL
  Filled 2016-08-29: qty 1

## 2016-08-29 MED ORDER — OXYCODONE-ACETAMINOPHEN 5-325 MG PO TABS: 1.0000 | ORAL_TABLET | Freq: Once | ORAL | Status: DC

## 2016-08-29 NOTE — ED Triage Notes (Signed)
Hysterectomy several years ago- now "gushing"blood- this has happened before  No PCP or GYN

## 2016-08-29 NOTE — ED Triage Notes (Signed)
Pt reports having a sudden "gush" of blood and feeling a "pop" while she was urinating. Pt states she has had a partial hysterectomy. Pt has been seen here recently for similar symptoms.

## 2016-08-29 NOTE — ED Provider Notes (Signed)
[facility] DEPT Provider Note   CSN: [id number] Arrival date & time: 08/29/16  2239     History   Chief Complaint Chief Complaint  Patient presents with  . Vaginal Bleeding    HPI [name] is a 31 y.o. female s/p hysterectomy who presents today with chief complaint sudden onset, progressively worsening waxing and waning abdominal pain and vaginal bleeding for 9 days. She has been evaluated for similar symptoms recently here. She sits most recently pain began yesterday morning and has been progressively worsening. Pain is localized to the suprapubic and left lower quadrant regions with occasional radiation to the flank and is sharp and crampy in nature. She states that at work tonight she went to urinate and noticed vaginal bleeding and a "clot ". She is prone to UTIs and used an Azo home test strip which was negative for UTI this morning. She also endorses nausea and decreased appetite. She has tried Ultram before work which was not helpful and she also took 3 Tylenol when she noticed bleeding at work which is also not been helpful.   Denies vaginal itching, discharge, hematuria, dysuria, vomiting, diahhrea, constiopation, Cp, SOB, fevers, chills, HA.   The history is provided by the patient.    Past Medical History:  Diagnosis Date  . Anxiety   . Asthma   . Depression   . Endometriosis   . History of asthma    with bronchitis  . History of genital warts   . History of kidney stones    passed stones, no surgery required  . History of pelvic inflammatory disease   . Mood disorder (HCC)    UNSPECIFIED  . Pelvic pain in female   . SVD (spontaneous vaginal delivery)     Patient Active Problem List   Diagnosis Date Noted  . Sprain of ankle 02/21/2015  . S/P hysterectomy 06/25/2014  . Pelvic pain in female 08/18/2013    Past Surgical History:  Procedure Laterality Date  . ABDOMINAL HYSTERECTOMY    . ABLATION COLPOCLESIS    . BILATERAL SALPINGECTOMY Bilateral  06/25/2014   Procedure: BILATERAL SALPINGECTOMY;  Surgeon: Cheri Fowler, MD;  Location: [facility];  Service: Gynecology;  Laterality: Bilateral;  . CYSTO WITH HYDRODISTENSION N/A 12/01/2013   Procedure: CYSTOSCOPY/HYDRODISTENSION with instillation of marcaine and pyridium;  Surgeon: Ardis Hughs, MD;  Location: [facility];  Service: Urology;  Laterality: N/A;  . CYSTOSCOPY N/A 06/25/2014   Procedure: CYSTOSCOPY;  Surgeon: Cheri Fowler, MD;  Location: [facility];  Service: Gynecology;  Laterality: N/A;  . DX LAPAROSCOPY/ ASPIRATION RIGHT OVARIAN CYST AND BX POSTERIOR CUL-DE-SAC  05-27-2005  . ESSURE TUBAL LIGATION Bilateral 2011  . LAPAROSCOPIC ASSISTED VAGINAL HYSTERECTOMY N/A 06/25/2014   Procedure: LAPAROSCOPIC ASSISTED VAGINAL HYSTERECTOMY;  Surgeon: Cheri Fowler, MD;  Location: [facility];  Service: Gynecology;  Laterality: N/A;  . LAPAROSCOPY N/A 08/18/2013   Procedure: LAPAROSCOPY DIAGNOSTIC, FULGERATION OF ENDOMETROSIS;  Surgeon: Cheri Fowler, MD;  Location: [facility];  Service: Gynecology;  Laterality: N/A;  . WISDOM TOOTH EXTRACTION      OB History    Gravida Para Term Preterm AB Living   2 2 2     2    SAB TAB Ectopic Multiple Live Births                   Home Medications    Prior to Admission medications   Medication Sig Start Date End Date Taking? Authorizing Provider  ALPRAZolam Duanne Moron) 1 MG tablet Take  1 mg by mouth 3 (three) times daily. 05/25/16  Yes [provider]  dicyclomine (BENTYL) 20 MG tablet Take 1 tablet (20 mg total) by mouth 2 (two) times daily. Patient taking differently: Take 20 mg by mouth 2 (two) times daily as needed for spasms.  07/24/16  Yes Rancour, Annie Main, MD  gabapentin (NEURONTIN) 300 MG capsule Take 300 mg by mouth 3 (three) times daily.   Yes [provider]  doxycycline (VIBRAMYCIN) 100 MG capsule Take 1 capsule (100 mg total) by mouth 2 (two) times daily. Patient not taking: Reported on 08/29/2016 07/28/16   Waynetta Pean, PA-C  HYDROcodone-acetaminophen (NORCO/VICODIN) 5-325 MG tablet Take 1-2 tablets by mouth every 4 (four) hours as needed. Patient not taking: Reported on 08/29/2016 08/21/16   Orpah Greek, MD  ondansetron (ZOFRAN) 4 MG tablet Take 1 tablet (4 mg total) by mouth every 8 (eight) hours as needed for nausea or vomiting. Patient not taking: Reported on 08/29/2016 07/24/16   Ezequiel Essex, MD    Family History History reviewed. No pertinent family history.  Social History Social History  Substance Use Topics  . Smoking status: Current Every Day Smoker    Packs/day: 0.50    Years: 4.00    Types: Cigarettes    Last attempt to quit: 11/27/2009  . Smokeless tobacco: Never Used  . Alcohol use No     Allergies   Biaxin [clarithromycin]; Penicillins; and Toradol [ketorolac tromethamine]   Review of Systems Review of Systems  Constitutional: Negative for chills and fever.  Respiratory: Negative for shortness of breath.   Cardiovascular: Negative for chest pain.  Gastrointestinal: Positive for abdominal pain and nausea. Negative for blood in stool, constipation, diarrhea and vomiting.  Genitourinary: Positive for vaginal bleeding. Negative for dysuria, flank pain, hematuria and vaginal discharge.  Musculoskeletal: Negative for back pain.  Neurological: Negative for headaches.     Physical Exam Updated Vital Signs BP 110/71 (BP Location: Left Arm)   Pulse 93   Temp 98 F (36.7 C)   Resp 18   Ht 5\' 3"  (1.6 m)   Wt 165 lb (74.8 kg)   LMP 06/15/2014   SpO2 98%   BMI 29.23 kg/m   Physical Exam  Constitutional: She appears well-developed and well-nourished. No distress.  HENT:  Head: Normocephalic and atraumatic.  Eyes: Conjunctivae are normal. Right eye exhibits no discharge. Left eye exhibits no discharge. No scleral icterus.  Neck: No JVD present. No tracheal deviation present.  Cardiovascular: Normal rate, regular rhythm, normal heart sounds and intact  distal pulses.   2+ radial pulses bilaterally  Pulmonary/Chest: Effort normal and breath sounds normal.  Abdominal: Soft. Bowel sounds are normal. She exhibits no distension. There is tenderness. There is no rebound and no guarding.  Suprapubic and left lower quadrant tender on palpation, improves with distraction.  Murphy's absent, no tenderness palpation at McBurney's point, Rovsing's absent.   Genitourinary:  Genitourinary Comments: No CVA tenderness bilaterally. Examination performed in the presence of a chaperone. Cervix is absent, there is  Brown-red discharge in the vaginal vault. There appears to be small tears to the vaginal wall with some bleeding. There is left adnexal tenderness and suprapubic tenderness to palpation. No lesions or ulcerations of the external genitalia. No rectal bleeding, hemorrhoids, sinus tracts,  or fissures noted.  Musculoskeletal: Normal range of motion.  No midline spine tenderness to palpation, no paraspinal muscle tenderness  Neurological: She is alert.  Fluent speech, no facial droop  Skin: Skin is  warm and dry. She is not diaphoretic.  Psychiatric: She has a normal mood and affect. Her behavior is normal.     ED Treatments / Results  Labs (all labs ordered are listed, but only abnormal results are displayed) Labs Reviewed  WET PREP, GENITAL - Abnormal; Notable for the following:       Result Value   Clue Cells Wet Prep HPF POC PRESENT (*)    All other components within normal limits  URINALYSIS, ROUTINE W REFLEX MICROSCOPIC - Abnormal; Notable for the following:    Hgb urine dipstick LARGE (*)    Ketones, ur 5 (*)    Squamous Epithelial / LPF 0-5 (*)    All other components within normal limits  URINE CULTURE  PREGNANCY, URINE  GC/CHLAMYDIA PROBE AMP ([place]) NOT AT [facility]    EKG  EKG Interpretation None       Radiology No results found.  Procedures Procedures (including critical care time)  Medications Ordered in  ED Medications  HYDROcodone-acetaminophen (NORCO/VICODIN) 5-325 MG per tablet 1 tablet (not administered)  ondansetron (ZOFRAN-ODT) disintegrating tablet 4 mg (4 mg Oral Given 08/29/16 2343)     Initial Impression / Assessment and Plan / ED Course  I have reviewed the triage vital signs and the nursing notes.  Pertinent labs & imaging results that were available during my care of the patient were reviewed by me and considered in my medical decision making (see chart for details).     Patient with continued vaginal bleeding. Afebrile, vital signs are stable. Small amount of brown red discharge on pelvic examination and vaginal wall tears. Patient denies recent sexual intercourse or trauma/foreign body insertion. UA not concerning for UTI, nephrolithiasis, hemorrhagic cystitis, pyelonephritis. Sent for culture. Wet prep shows clue cells, but no wbc's. She has had clue cells on multiple wet preps in the past. Low suspicion of bacterial vaginitis and absence of excessive and/or malodorous vaginal discharge. Pregnancy negative. While low suspicion of TOA or ovarian torsion, cannot rule out. Instructed patient to return tomorrow for ultrasound to rule out mass, or other pelvic abnormality. Nausea and pain management while in ED. Encouraged patient to once again establish care with OB/GYN. Discussed strict ED return precautions.  Pt verbalized understanding of and agreement with plan and is safe for discharge home at this time.  Final Clinical Impressions(s)  is clear/ ED Diagnoses   Final diagnoses:  Abnormal vaginal bleeding    New Prescriptions New Prescriptions   No medications on file     Debroah Baller 08/30/16 0116    Ezequiel Essex, MD 08/30/16 [phone]

## 2016-08-29 NOTE — ED Provider Notes (Signed)
By signing my name below, I, Dyke Brackett, attest that this documentation has been prepared under the direction and in the presence of Marilou Barnfield, Annie Main, MD . Electronically Signed: Dyke Brackett, Scribe. 08/30/2016. 12:01 AM.   [name] is a 31 y.o. female who presents to the Emergency Department complaining of sudden onset vaginal bleeding today. Pt state she went to the restroom and was "gushing" blood. Pt denies any recent sexual activity or trauma.She reports associated intermittent sharp LLQ pain x 9 days. Pt has been seen in the ED multiple times for the same. Pt was scheduled for an ultrasound on 07/24/16, but did not come to her appointment. She is not currently followed by an OBGYN. She denies any rectal bleeding or dysuria. SHx partial hysterectomy.   Exam: Appears well, nontoxic, in full make-up. Suprapubic and LLQ tenderness. No guarding or rebound. Improves with distraction.   Doubt ovarian torsion.  Very comfortable. Has failed to show up for scheduled pelvis US in the past.   I personally performed the services described in this documentation, which was scribed in my presence. The recorded information has been reviewed and is accurate.    Ezequiel Essex, MD 08/30/16 [phone]

## 2016-08-30 ENCOUNTER — Other Ambulatory Visit (HOSPITAL_COMMUNITY): Payer: Self-pay | Admitting: Physician Assistant

## 2016-08-30 ENCOUNTER — Ambulatory Visit (HOSPITAL_COMMUNITY)
Admit: 2016-08-30 | Discharge: 2016-08-30 | Disposition: A | Discharge status: Home / Self Care | Payer: Self-pay | Attending: Emergency Medicine | Admitting: Emergency Medicine

## 2016-08-30 DIAGNOSIS — R1032 Left lower quadrant pain: Secondary | ICD-10-CM | POA: Insufficient documentation

## 2016-08-30 DIAGNOSIS — R102 Pelvic and perineal pain: Secondary | ICD-10-CM

## 2016-08-30 DIAGNOSIS — Z90710 Acquired absence of both cervix and uterus: Secondary | ICD-10-CM | POA: Insufficient documentation

## 2016-08-30 LAB — WET PREP, GENITAL
Sperm: NONE SEEN
Trich, Wet Prep: NONE SEEN
WBC, Wet Prep HPF POC: NONE SEEN
Yeast Wet Prep HPF POC: NONE SEEN

## 2016-08-30 LAB — URINALYSIS, ROUTINE W REFLEX MICROSCOPIC
Bacteria, UA: NONE SEEN
Bilirubin Urine: NEGATIVE
Glucose, UA: NEGATIVE mg/dL
Ketones, ur: 5 mg/dL — AB
Leukocytes, UA: NEGATIVE
Nitrite: NEGATIVE
Protein, ur: NEGATIVE mg/dL
Specific Gravity, Urine: 1.012 (ref 1.005–1.030)
pH: 5 (ref 5.0–8.0)

## 2016-08-30 LAB — PREGNANCY, URINE: Preg Test, Ur: NEGATIVE

## 2016-08-30 MED ORDER — HYDROCODONE-ACETAMINOPHEN 5-325 MG PO TABS
1.0000 | ORAL_TABLET | Freq: Once | ORAL | Status: AC
  Administered 2016-08-30: 1 via ORAL
  Filled 2016-08-30: qty 1

## 2016-08-30 NOTE — ED Notes (Signed)
Pt states understanding of care given and follow up instructions.  Pt a/o ambulated from ED with steady gait 

## 2016-08-30 NOTE — Discharge Instructions (Signed)
Continue to use heating pads, ibuprofen, Tylenol for ear pain. Follow up tomorrow for ultrasound. Follow up with OB/GYN for reevaluation. Call [facility] and wellness to schedule an appointment to establish primary care with them. Return to the ED if any concerning symptoms develop.

## 2016-08-31 LAB — GC/CHLAMYDIA PROBE AMP (~~LOC~~) NOT AT ARMC
Chlamydia: NEGATIVE
Neisseria Gonorrhea: NEGATIVE

## 2016-08-31 LAB — URINE CULTURE: Culture: <10000 — AB

## 2016-09-21 ENCOUNTER — Emergency Department (HOSPITAL_COMMUNITY)
Admission: EM | Admit: 2016-09-21 | Discharge: 2016-09-21 | Disposition: A | Discharge status: Home / Self Care | Payer: Self-pay | Attending: Emergency Medicine | Admitting: Emergency Medicine

## 2016-09-21 ENCOUNTER — Encounter (HOSPITAL_COMMUNITY): Payer: Self-pay | Admitting: Emergency Medicine

## 2016-09-21 DIAGNOSIS — Z79899 Other long term (current) drug therapy: Secondary | ICD-10-CM | POA: Insufficient documentation

## 2016-09-21 DIAGNOSIS — R102 Pelvic and perineal pain: Secondary | ICD-10-CM

## 2016-09-21 DIAGNOSIS — J45909 Unspecified asthma, uncomplicated: Secondary | ICD-10-CM | POA: Insufficient documentation

## 2016-09-21 DIAGNOSIS — N898 Other specified noninflammatory disorders of vagina: Secondary | ICD-10-CM | POA: Insufficient documentation

## 2016-09-21 DIAGNOSIS — R350 Frequency of micturition: Secondary | ICD-10-CM | POA: Insufficient documentation

## 2016-09-21 DIAGNOSIS — R300 Dysuria: Secondary | ICD-10-CM | POA: Insufficient documentation

## 2016-09-21 DIAGNOSIS — F17210 Nicotine dependence, cigarettes, uncomplicated: Secondary | ICD-10-CM | POA: Insufficient documentation

## 2016-09-21 DIAGNOSIS — R1030 Lower abdominal pain, unspecified: Secondary | ICD-10-CM | POA: Insufficient documentation

## 2016-09-21 LAB — URINALYSIS, ROUTINE W REFLEX MICROSCOPIC
Glucose, UA: NEGATIVE mg/dL
Hgb urine dipstick: NEGATIVE
Ketones, ur: NEGATIVE mg/dL
Leukocytes, UA: NEGATIVE
Nitrite: NEGATIVE
Protein, ur: NEGATIVE mg/dL
Specific Gravity, Urine: 1.044 — ABNORMAL HIGH (ref 1.005–1.030)
pH: 5 (ref 5.0–8.0)

## 2016-09-21 MED ORDER — IBUPROFEN 600 MG PO TABS: 600.0000 mg | ORAL_TABLET | Freq: Four times a day (QID) | ORAL | 0 refills | Status: DC

## 2016-09-21 MED ORDER — ONDANSETRON HCL 4 MG PO TABS
4.0000 mg | ORAL_TABLET | Freq: Once | ORAL | Status: AC
  Administered 2016-09-21: 4 mg via ORAL
  Filled 2016-09-21: qty 1

## 2016-09-21 MED ORDER — ACETAMINOPHEN 500 MG PO TABS
1000.0000 mg | ORAL_TABLET | Freq: Once | ORAL | Status: AC
  Administered 2016-09-21: 1000 mg via ORAL
  Filled 2016-09-21: qty 2

## 2016-09-21 MED ORDER — PHENAZOPYRIDINE HCL 100 MG PO TABS: 100.0000 mg | ORAL_TABLET | Freq: Three times a day (TID) | ORAL | 0 refills | Status: DC | PRN

## 2016-09-21 MED ORDER — IBUPROFEN 800 MG PO TABS
800.0000 mg | ORAL_TABLET | Freq: Once | ORAL | Status: AC
  Administered 2016-09-21: 800 mg via ORAL
  Filled 2016-09-21: qty 1

## 2016-09-21 MED ORDER — PHENAZOPYRIDINE HCL 100 MG PO TABS
100.0000 mg | ORAL_TABLET | Freq: Once | ORAL | Status: AC
  Administered 2016-09-21: 100 mg via ORAL
  Filled 2016-09-21: qty 1

## 2016-09-21 NOTE — Discharge Instructions (Signed)
Your vital signs within normal limits. Your urine test is negative for infection, or kidney stone or other acute problem. Please use Pyridium 3 times daily with a meal. Please use 600 mg of ibuprofen, and 500 mg of Tylenol with breakfast, lunch, dinner, and at bedtime to help with your discomfort. Please call the [facility] here in [place] for a urology evaluation of your recurring pain.

## 2016-09-21 NOTE — ED Notes (Signed)
Pt alert & oriented x4, stable gait. Patient given discharge instructions, paperwork & prescription(s). Patient  instructed to stop at the registration desk to finish any additional paperwork. Patient verbalized understanding. Pt left department w/ no further questions. 

## 2016-09-21 NOTE — ED Notes (Signed)
Pt reports pain w/ urination & vaginal discharge that started about 2 days ago.

## 2016-09-21 NOTE — ED Triage Notes (Signed)
Pain with urination and lower abd pain since yesterday

## 2016-09-21 NOTE — ED Provider Notes (Signed)
[facility] DEPT Provider Note   CSN: [id number] Arrival date & time: 09/21/16  2015     History   Chief Complaint Chief Complaint  Patient presents with  . Dysuria    HPI [name] is a 31 y.o. female.  The history is provided by the patient.  Dysuria   This is a new problem. The problem occurs intermittently. The problem has not changed since onset.The quality of the pain is described as burning (pain). The pain is moderate. There has been no fever. She is sexually active. Associated symptoms include discharge and frequency. Pertinent negatives include no chills, no sweats, no vomiting, no hematuria and no flank pain. She has tried nothing for the symptoms. Her past medical history is significant for kidney stones. Past medical history comments: genital warts.    Past Medical History:  Diagnosis Date  . Anxiety   . Asthma   . Depression   . Endometriosis   . History of asthma    with bronchitis  . History of genital warts   . History of kidney stones    passed stones, no surgery required  . History of pelvic inflammatory disease   . Mood disorder (HCC)    UNSPECIFIED  . Pelvic pain in female   . SVD (spontaneous vaginal delivery)     Patient Active Problem List   Diagnosis Date Noted  . Sprain of ankle 02/21/2015  . S/P hysterectomy 06/25/2014  . Pelvic pain in female 08/18/2013    Past Surgical History:  Procedure Laterality Date  . ABDOMINAL HYSTERECTOMY    . ABLATION COLPOCLESIS    . BILATERAL SALPINGECTOMY Bilateral 06/25/2014   Procedure: BILATERAL SALPINGECTOMY;  Surgeon: Cheri Fowler, MD;  Location: [facility];  Service: Gynecology;  Laterality: Bilateral;  . CYSTO WITH HYDRODISTENSION N/A 12/01/2013   Procedure: CYSTOSCOPY/HYDRODISTENSION with instillation of marcaine and pyridium;  Surgeon: Ardis Hughs, MD;  Location: [facility];  Service: Urology;  Laterality: N/A;  . CYSTOSCOPY N/A 06/25/2014   Procedure: CYSTOSCOPY;   Surgeon: Cheri Fowler, MD;  Location: [facility];  Service: Gynecology;  Laterality: N/A;  . DX LAPAROSCOPY/ ASPIRATION RIGHT OVARIAN CYST AND BX POSTERIOR CUL-DE-SAC  05-27-2005  . ESSURE TUBAL LIGATION Bilateral 2011  . LAPAROSCOPIC ASSISTED VAGINAL HYSTERECTOMY N/A 06/25/2014   Procedure: LAPAROSCOPIC ASSISTED VAGINAL HYSTERECTOMY;  Surgeon: Cheri Fowler, MD;  Location: [facility];  Service: Gynecology;  Laterality: N/A;  . LAPAROSCOPY N/A 08/18/2013   Procedure: LAPAROSCOPY DIAGNOSTIC, FULGERATION OF ENDOMETROSIS;  Surgeon: Cheri Fowler, MD;  Location: [facility];  Service: Gynecology;  Laterality: N/A;  . WISDOM TOOTH EXTRACTION      OB History    Gravida Para Term Preterm AB Living   2 2 2     2    SAB TAB Ectopic Multiple Live Births                   Home Medications    Prior to Admission medications   Medication Sig Start Date End Date Taking? Authorizing Provider  ALPRAZolam Duanne Moron) 1 MG tablet Take 1 mg by mouth 3 (three) times daily. 05/25/16   [provider]  dicyclomine (BENTYL) 20 MG tablet Take 1 tablet (20 mg total) by mouth 2 (two) times daily. Patient taking differently: Take 20 mg by mouth 2 (two) times daily as needed for spasms.  07/24/16   Rancour, Annie Main, MD  doxycycline (VIBRAMYCIN) 100 MG capsule Take 1 capsule (100 mg total) by mouth 2 (two) times daily. Patient  not taking: Reported on 08/29/2016 07/28/16   Waynetta Pean, PA-C  gabapentin (NEURONTIN) 300 MG capsule Take 300 mg by mouth 3 (three) times daily.    [provider]  HYDROcodone-acetaminophen (NORCO/VICODIN) 5-325 MG tablet Take 1-2 tablets by mouth every 4 (four) hours as needed. Patient not taking: Reported on 08/29/2016 08/21/16   Orpah Greek, MD  ibuprofen (ADVIL,MOTRIN) 600 MG tablet Take 1 tablet (600 mg total) by mouth 4 (four) times daily. 09/21/16   Lily Kocher, PA-C  ondansetron (ZOFRAN) 4 MG tablet Take 1 tablet (4 mg total) by mouth every 8 (eight) hours as needed  for nausea or vomiting. Patient not taking: Reported on 08/29/2016 07/24/16   Ezequiel Essex, MD  phenazopyridine (PYRIDIUM) 100 MG tablet Take 1 tablet (100 mg total) by mouth 3 (three) times daily as needed for pain. 09/21/16   Lily Kocher, PA-C    Family History No family history on file.  Social History Social History  Substance Use Topics  . Smoking status: Current Every Day Smoker    Packs/day: 0.50    Years: 4.00    Types: Cigarettes    Last attempt to quit: 11/27/2009  . Smokeless tobacco: Never Used  . Alcohol use No     Allergies   Biaxin [clarithromycin]; Penicillins; and Toradol [ketorolac tromethamine]   Review of Systems Review of Systems  Constitutional: Negative for activity change, appetite change, chills and fever.       All ROS Neg except as noted in HPI  HENT: Negative for nosebleeds.   Eyes: Negative for photophobia and discharge.  Respiratory: Negative for cough, shortness of breath and wheezing.   Cardiovascular: Negative for chest pain and palpitations.  Gastrointestinal: Negative for abdominal pain, blood in stool and vomiting.  Genitourinary: Positive for dysuria, frequency and vaginal discharge. Negative for flank pain, hematuria and vaginal bleeding.  Musculoskeletal: Negative for arthralgias, back pain and neck pain.  Skin: Negative.   Neurological: Negative for dizziness, seizures and speech difficulty.  Psychiatric/Behavioral: Negative for confusion and hallucinations.     Physical Exam Updated Vital Signs BP (!) 148/82 (BP Location: Right Arm)   Pulse 72   Resp 18   Ht 5\' 3"  (1.6 m)   Wt 72.6 kg (160 lb)   LMP 06/15/2014   SpO2 100%   BMI 28.34 kg/m   Physical Exam  Constitutional: She is oriented to person, place, and time. Vital signs are normal. She appears well-developed and well-nourished. She is active.  Non-toxic appearance.  HENT:  Head: Normocephalic and atraumatic.  Right Ear: Tympanic membrane, external ear and ear  canal normal.  Left Ear: Tympanic membrane, external ear and ear canal normal.  Nose: Nose normal.  Mouth/Throat: Uvula is midline, oropharynx is clear and moist and mucous membranes are normal.  Eyes: Conjunctivae, EOM and lids are normal. Pupils are equal, round, and reactive to light.  Neck: Trachea normal, normal range of motion and phonation normal. Neck supple. Carotid bruit is not present.  Cardiovascular: Normal rate, regular rhythm, normal heart sounds, intact distal pulses and normal pulses.   Pulmonary/Chest: Breath sounds normal. No respiratory distress.  Abdominal: Soft. Normal appearance and bowel sounds are normal. She exhibits no ascites and no pulsatile midline mass. There is no splenomegaly or hepatomegaly. There is tenderness in the suprapubic area. There is no rigidity, no guarding, no CVA tenderness, no tenderness at McBurney's point and negative Murphy's sign.  Musculoskeletal: Normal range of motion.  Lymphadenopathy:  Head (right side): No submental, no submandibular, no preauricular and no posterior auricular adenopathy present.       Head (left side): No submental, no submandibular, no preauricular and no posterior auricular adenopathy present.    She has no cervical adenopathy.  Neurological: She is alert and oriented to person, place, and time. She has normal strength. No cranial nerve deficit or sensory deficit. GCS eye subscore is 4. GCS verbal subscore is 5. GCS motor subscore is 6.  Skin: Skin is warm and dry.  Psychiatric: She has a normal mood and affect. Her speech is normal.  Nursing note and vitals reviewed.    ED Treatments / Results  Labs (all labs ordered are listed, but only abnormal results are displayed) Labs Reviewed  URINALYSIS, ROUTINE W REFLEX MICROSCOPIC - Abnormal; Notable for the following:       Result Value   APPearance HAZY (*)    Specific Gravity, Urine 1.044 (*)    Bilirubin Urine SMALL (*)    All other components within normal  limits  URINE CULTURE    EKG  EKG Interpretation None       Radiology No results found.  Procedures Procedures (including critical care time)  Medications Ordered in ED Medications  ibuprofen (ADVIL,MOTRIN) tablet 800 mg (not administered)  acetaminophen (TYLENOL) tablet 1,000 mg (not administered)  phenazopyridine (PYRIDIUM) tablet 100 mg (not administered)  ondansetron (ZOFRAN) tablet 4 mg (not administered)     Initial Impression / Assessment and Plan / ED Course  I have reviewed the triage vital signs and the nursing notes.  Pertinent labs & imaging results that were available during my care of the patient were reviewed by me and considered in my medical decision making (see chart for details).       Final Clinical Impressions(s) / ED Diagnoses MDM Vital signs reviewed. Urinalysis shows a hazy specimen with a specific gravity 1.044. Negative for glucose and negative blood. Negative nitrates and negative leukocyte esterase noted. No temperature elevation, no back pain no elevation in heart rate during the emergency department visit. Patient had a recent pelvic examination in the emergency department. This examination was not repeated at this visit. Review of the patient's previous records reveals a chronic abdominal pain. It is suspected that this may be related to the patient's chronic issue.  Discussed the findings with the patient in terms which he understands. Patient placed on ibuprofen and Pyridium. I've asked her to see the physicians at the local health department, and or GYN M.D. for additional evaluation and management. The patient will return to the emergency department if any unusual changes, problems, or concerns.    Final diagnoses:  Dysuria  Suprapubic pain    New Prescriptions New Prescriptions   IBUPROFEN (ADVIL,MOTRIN) 600 MG TABLET    Take 1 tablet (600 mg total) by mouth 4 (four) times daily.   PHENAZOPYRIDINE (PYRIDIUM) 100 MG TABLET    Take 1  tablet (100 mg total) by mouth 3 (three) times daily as needed for pain.     Lily Kocher, PA-C 09/23/16 1036    Lily Kocher, PA-C 09/23/16 1042    Mesner, Corene Cornea, MD 09/23/16 [phone]

## 2016-09-23 LAB — URINE CULTURE: Culture: NO GROWTH

## 2016-10-19 ENCOUNTER — Emergency Department (HOSPITAL_COMMUNITY)
Admission: EM | Admit: 2016-10-19 | Discharge: 2016-10-20 | Disposition: A | Discharge status: Home / Self Care | Payer: Self-pay | Attending: Emergency Medicine | Admitting: Emergency Medicine

## 2016-10-19 ENCOUNTER — Encounter (HOSPITAL_COMMUNITY): Payer: Self-pay | Admitting: Adult Health

## 2016-10-19 DIAGNOSIS — J45909 Unspecified asthma, uncomplicated: Secondary | ICD-10-CM | POA: Insufficient documentation

## 2016-10-19 DIAGNOSIS — F17210 Nicotine dependence, cigarettes, uncomplicated: Secondary | ICD-10-CM | POA: Insufficient documentation

## 2016-10-19 DIAGNOSIS — R1010 Upper abdominal pain, unspecified: Secondary | ICD-10-CM | POA: Insufficient documentation

## 2016-10-19 DIAGNOSIS — Z79899 Other long term (current) drug therapy: Secondary | ICD-10-CM | POA: Insufficient documentation

## 2016-10-19 LAB — POC OCCULT BLOOD, ED: Fecal Occult Bld: NEGATIVE

## 2016-10-19 MED ORDER — MORPHINE SULFATE (PF) 4 MG/ML IV SOLN
4.0000 mg | Freq: Once | INTRAVENOUS | Status: AC
  Administered 2016-10-20: 4 mg via INTRAVENOUS
  Filled 2016-10-19: qty 1

## 2016-10-19 MED ORDER — PANTOPRAZOLE SODIUM 40 MG IV SOLR
40.0000 mg | Freq: Once | INTRAVENOUS | Status: AC
  Administered 2016-10-20: 40 mg via INTRAVENOUS
  Filled 2016-10-19: qty 40

## 2016-10-19 MED ORDER — ONDANSETRON HCL 4 MG/2ML IJ SOLN
4.0000 mg | Freq: Once | INTRAMUSCULAR | Status: AC
  Administered 2016-10-20: 4 mg via INTRAVENOUS
  Filled 2016-10-19: qty 2

## 2016-10-19 NOTE — ED Provider Notes (Signed)
[facility] DEPT Provider Note   CSN: [id number] Arrival date & time: 10/19/16  2230     History   Chief Complaint Chief Complaint  Patient presents with  . Hematochezia    HPI MAEGHAN CANNY is a 31 y.o. female.  HPI  This is a 31 year old female with a history of anxiety, kidney stones, PID, status post hysterectomy who presents with abdominal pain. Patient reports a 4 day history of right upper quadrant pain. She describes it as sharp and nonradiating. It is worse with certain foods, particularly spicy foods. Patient thought it was indigestion. She took Tums with minimal relief. She states that tonight while at work she had a normal bowel movement but noted blood in the bowel movement. Reports nausea without vomiting. Also reports lightheadedness. Denies room spinning dizziness. She reports fairly frequent ibuprofen use 3-4 times per week. She also reports that she was diagnosed with "an ulcer" without endoscopy approximately one year ago.  Past Medical History:  Diagnosis Date  . Anxiety   . Asthma   . Depression   . Endometriosis   . History of asthma    with bronchitis  . History of genital warts   . History of kidney stones    passed stones, no surgery required  . History of pelvic inflammatory disease   . Mood disorder (HCC)    UNSPECIFIED  . Pelvic pain in female   . SVD (spontaneous vaginal delivery)     Patient Active Problem List   Diagnosis Date Noted  . Sprain of ankle 02/21/2015  . S/P hysterectomy 06/25/2014  . Pelvic pain in female 08/18/2013    Past Surgical History:  Procedure Laterality Date  . ABDOMINAL HYSTERECTOMY    . ABLATION COLPOCLESIS    . BILATERAL SALPINGECTOMY Bilateral 06/25/2014   Procedure: BILATERAL SALPINGECTOMY;  Surgeon: Cheri Fowler, MD;  Location: [facility];  Service: Gynecology;  Laterality: Bilateral;  . CYSTO WITH HYDRODISTENSION N/A 12/01/2013   Procedure: CYSTOSCOPY/HYDRODISTENSION with instillation of marcaine and  pyridium;  Surgeon: Ardis Hughs, MD;  Location: [facility];  Service: Urology;  Laterality: N/A;  . CYSTOSCOPY N/A 06/25/2014   Procedure: CYSTOSCOPY;  Surgeon: Cheri Fowler, MD;  Location: [facility];  Service: Gynecology;  Laterality: N/A;  . DX LAPAROSCOPY/ ASPIRATION RIGHT OVARIAN CYST AND BX POSTERIOR CUL-DE-SAC  05-27-2005  . ESSURE TUBAL LIGATION Bilateral 2011  . LAPAROSCOPIC ASSISTED VAGINAL HYSTERECTOMY N/A 06/25/2014   Procedure: LAPAROSCOPIC ASSISTED VAGINAL HYSTERECTOMY;  Surgeon: Cheri Fowler, MD;  Location: [facility];  Service: Gynecology;  Laterality: N/A;  . LAPAROSCOPY N/A 08/18/2013   Procedure: LAPAROSCOPY DIAGNOSTIC, FULGERATION OF ENDOMETROSIS;  Surgeon: Cheri Fowler, MD;  Location: [facility];  Service: Gynecology;  Laterality: N/A;  . WISDOM TOOTH EXTRACTION      OB History    Gravida Para Term Preterm AB Living   2 2 2     2    SAB TAB Ectopic Multiple Live Births                   Home Medications    Prior to Admission medications   Medication Sig Start Date End Date Taking? Authorizing Provider  ALPRAZolam Duanne Moron) 1 MG tablet Take 1 mg by mouth 3 (three) times daily as needed for anxiety.  05/25/16  Yes [provider]  dicyclomine (BENTYL) 20 MG tablet Take 1 tablet (20 mg total) by mouth 2 (two) times daily. Patient not taking: Reported on 10/19/2016 07/24/16   Ezequiel Essex, MD  doxycycline (VIBRAMYCIN) 100 MG capsule Take 1 capsule (100 mg total) by mouth 2 (two) times daily. Patient not taking: Reported on 08/29/2016 07/28/16   Waynetta Pean, PA-C  HYDROcodone-acetaminophen (NORCO/VICODIN) 5-325 MG tablet Take 1-2 tablets by mouth every 4 (four) hours as needed. Patient not taking: Reported on 08/29/2016 08/21/16   Orpah Greek, MD  ibuprofen (ADVIL,MOTRIN) 600 MG tablet Take 1 tablet (600 mg total) by mouth 4 (four) times daily. Patient not taking: Reported on 10/19/2016 09/21/16   Lily Kocher, PA-C  omeprazole (PRILOSEC)  20 MG capsule Take 1 capsule (20 mg total) by mouth daily. 10/20/16   Keri Veale, Barbette Hair, MD  ondansetron (ZOFRAN ODT) 4 MG disintegrating tablet Take 1 tablet (4 mg total) by mouth every 8 (eight) hours as needed for nausea or vomiting. 10/20/16   Donyae Kilner, Barbette Hair, MD  ondansetron (ZOFRAN) 4 MG tablet Take 1 tablet (4 mg total) by mouth every 8 (eight) hours as needed for nausea or vomiting. Patient not taking: Reported on 08/29/2016 07/24/16   Ezequiel Essex, MD  phenazopyridine (PYRIDIUM) 100 MG tablet Take 1 tablet (100 mg total) by mouth 3 (three) times daily as needed for pain. Patient not taking: Reported on 10/19/2016 09/21/16   Lily Kocher, PA-C    Family History History reviewed. No pertinent family history.  Social History Social History  Substance Use Topics  . Smoking status: Current Every Day Smoker    Packs/day: 0.50    Years: 4.00    Types: Cigarettes    Last attempt to quit: 11/27/2009  . Smokeless tobacco: Never Used  . Alcohol use No     Allergies   Biaxin [clarithromycin]; Penicillins; and Toradol [ketorolac tromethamine]   Review of Systems Review of Systems  Constitutional: Negative for fever.  Respiratory: Negative for chest tightness and shortness of breath.   Cardiovascular: Negative for chest pain.  Gastrointestinal: Positive for abdominal pain, blood in stool and nausea. Negative for constipation, diarrhea and vomiting.  Genitourinary: Negative for dysuria.  Musculoskeletal: Negative for back pain.  All other systems reviewed and are negative.    Physical Exam Updated Vital Signs BP (!) 111/95   Pulse 69   Temp 97.9 F (36.6 C) (Oral)   Resp 14   Ht 5\' 3"  (1.6 m)   Wt 72.6 kg (160 lb)   LMP 06/15/2014   SpO2 100%   BMI 28.34 kg/m   Physical Exam  Constitutional: She is oriented to person, place, and time. She appears well-developed and well-nourished. No distress.  HENT:  Head: Normocephalic and atraumatic.  Cardiovascular: Normal  rate, regular rhythm and normal heart sounds.   No murmur heard. Pulmonary/Chest: Effort normal and breath sounds normal. No respiratory distress. She has no wheezes.  Abdominal: Soft. Bowel sounds are normal. She exhibits no mass. There is tenderness. There is no guarding.  Right upper quadrant tenderness to palpation without rebound or guarding  Genitourinary: Rectal exam shows guaiac negative stool.  Musculoskeletal: She exhibits no edema.  Neurological: She is alert and oriented to person, place, and time.  Skin: Skin is warm and dry.  Psychiatric: She has a normal mood and affect.  Nursing note and vitals reviewed.    ED Treatments / Results  Labs (all labs ordered are listed, but only abnormal results are displayed) Labs Reviewed  COMPREHENSIVE METABOLIC PANEL - Abnormal; Notable for the following:       Result Value   Alkaline Phosphatase 34 (*)    All other components within normal  limits  CBC - Abnormal; Notable for the following:    RBC 3.77 (*)    MCV 100.5 (*)    MCH 34.2 (*)    All other components within normal limits  URINALYSIS, ROUTINE W REFLEX MICROSCOPIC - Abnormal; Notable for the following:    Color, Urine STRAW (*)    All other components within normal limits  POC OCCULT BLOOD, ED  TYPE AND SCREEN    EKG  EKG Interpretation  Date/Time:  Monday October 19 2016 23:07:37 EDT Ventricular Rate:  65 PR Interval:    QRS Duration: 98 QT Interval:  362 QTC Calculation: 377 R Axis:   73 Text Interpretation:  Sinus rhythm Borderline Q waves in inferior leads No prior for comparison Confirmed by Thayer Jew [phone]) on 10/19/2016 11:18:20 PM       Radiology No results found.  Procedures Procedures (including critical care time)  Medications Ordered in ED Medications  morphine 4 MG/ML injection 4 mg (4 mg Intravenous Given 10/20/16 0009)  ondansetron (ZOFRAN) injection 4 mg (4 mg Intravenous Given 10/20/16 0009)  pantoprazole (PROTONIX) injection 40 mg  (40 mg Intravenous Given 10/20/16 0009)     Initial Impression / Assessment and Plan / ED Course  I have reviewed the triage vital signs and the nursing notes.  Pertinent labs & imaging results that were available during my care of the patient were reviewed by me and considered in my medical decision making (see chart for details).     Patient presents with upper abdominal pain. Worse with eating. She also reports a bloody bowel movement today. She is nontoxic. Vital signs reassuring. She is not orthostatic. Lab work is largely reassuring including a hemoglobin of 12.9. Hemoccult is negative. No gross blood on exam. Patient much improved with Protonix and Zofran. She is able to tolerate fluids. Suspect gastritis versus peptic ulcer disease. Gallbladder is another consideration but this would not cause any blood in the stools. Will start on a PPI and provide GI follow-up.  After history, exam, and medical workup I feel the patient has been appropriately medically screened and is safe for discharge home. Pertinent diagnoses were discussed with the patient. Patient was given return precautions.   Final Clinical Impressions(s) / ED Diagnoses   Final diagnoses:  Upper abdominal pain    New Prescriptions New Prescriptions   OMEPRAZOLE (PRILOSEC) 20 MG CAPSULE    Take 1 capsule (20 mg total) by mouth daily.   ONDANSETRON (ZOFRAN ODT) 4 MG DISINTEGRATING TABLET    Take 1 tablet (4 mg total) by mouth every 8 (eight) hours as needed for nausea or vomiting.     Merryl Hacker, MD 10/20/16 [phone]

## 2016-10-19 NOTE — ED Triage Notes (Signed)
Presents with dizziness and nausea that began over the weekend associated with upper right sided abdominal pain that felt like indigestion but is now sharp. She tried tums today with no relief. At 7 pm tonight endorses one episode of dark tarry stools and brown blood on toilet tissue. Still feeling dizzy.

## 2016-10-20 LAB — COMPREHENSIVE METABOLIC PANEL
ALT: 18 U/L (ref 14–54)
AST: 21 U/L (ref 15–41)
Albumin: 3.9 g/dL (ref 3.5–5.0)
Alkaline Phosphatase: 34 U/L — ABNORMAL LOW (ref 38–126)
Anion gap: 8 (ref 5–15)
BUN: 14 mg/dL (ref 6–20)
CO2: 25 mmol/L (ref 22–32)
Calcium: 9.4 mg/dL (ref 8.9–10.3)
Chloride: 106 mmol/L (ref 101–111)
Creatinine, Ser: 0.76 mg/dL (ref 0.44–1.00)
GFR calc Af Amer: >60 mL/min (ref >60)
GFR calc non Af Amer: >60 mL/min (ref >60)
Glucose, Bld: 93 mg/dL (ref 65–99)
Potassium: 4.8 mmol/L (ref 3.5–5.1)
Sodium: 139 mmol/L (ref 135–145)
Total Bilirubin: 0.3 mg/dL (ref 0.3–1.2)
Total Protein: 6.6 g/dL (ref 6.5–8.1)

## 2016-10-20 LAB — URINALYSIS, ROUTINE W REFLEX MICROSCOPIC
Bilirubin Urine: NEGATIVE
Glucose, UA: NEGATIVE mg/dL
Hgb urine dipstick: NEGATIVE
Ketones, ur: NEGATIVE mg/dL
Leukocytes, UA: NEGATIVE
Nitrite: NEGATIVE
Protein, ur: NEGATIVE mg/dL
Specific Gravity, Urine: 1.01 (ref 1.005–1.030)
pH: 5 (ref 5.0–8.0)

## 2016-10-20 LAB — CBC
HCT: 37.9 % (ref 36.0–46.0)
Hemoglobin: 12.9 g/dL (ref 12.0–15.0)
MCH: 34.2 pg — ABNORMAL HIGH (ref 26.0–34.0)
MCHC: 34 g/dL (ref 30.0–36.0)
MCV: 100.5 fL — ABNORMAL HIGH (ref 78.0–100.0)
Platelets: 243 10*3/uL (ref 150–400)
RBC: 3.77 MIL/uL — ABNORMAL LOW (ref 3.87–5.11)
RDW: 14 % (ref 11.5–15.5)
WBC: 7.7 10*3/uL (ref 4.0–10.5)

## 2016-10-20 LAB — TYPE AND SCREEN
ABO/RH(D): A POS
Antibody Screen: NEGATIVE

## 2016-10-20 MED ORDER — OMEPRAZOLE 20 MG PO CPDR: 20.0000 mg | DELAYED_RELEASE_CAPSULE | Freq: Every day | ORAL | 0 refills | Status: DC

## 2016-10-20 MED ORDER — ONDANSETRON 4 MG PO TBDP: 4.0000 mg | ORAL_TABLET | Freq: Three times a day (TID) | ORAL | 0 refills | Status: DC | PRN

## 2016-10-20 NOTE — Discharge Instructions (Signed)
You will be given a daily acid reducer. Avoid anti-inflammatory medications including ibuprofen. Follow-up with gastroenterology recommended.

## 2016-10-20 NOTE — ED Notes (Signed)
Pt given Sprite and Crackers for PO trial.

## 2016-10-20 NOTE — ED Notes (Addendum)
Assumed care of patient from [place], [place]. Pt resting quietly. No distress. No complaints. Reports RUQ pain, dark stools - requesting pain meds, updated EDP. Awaiting results. Pt informed that UA needed, states unable at this time.

## 2016-11-25 ENCOUNTER — Encounter (HOSPITAL_COMMUNITY): Payer: Self-pay

## 2016-11-25 ENCOUNTER — Emergency Department (HOSPITAL_COMMUNITY): Payer: Self-pay

## 2016-11-25 ENCOUNTER — Emergency Department (HOSPITAL_COMMUNITY)
Admission: EM | Admit: 2016-11-25 | Discharge: 2016-11-25 | Disposition: A | Discharge status: Home / Self Care | Payer: Self-pay | Attending: Emergency Medicine | Admitting: Emergency Medicine

## 2016-11-25 DIAGNOSIS — F17210 Nicotine dependence, cigarettes, uncomplicated: Secondary | ICD-10-CM | POA: Insufficient documentation

## 2016-11-25 DIAGNOSIS — J40 Bronchitis, not specified as acute or chronic: Secondary | ICD-10-CM | POA: Insufficient documentation

## 2016-11-25 DIAGNOSIS — R21 Rash and other nonspecific skin eruption: Secondary | ICD-10-CM | POA: Insufficient documentation

## 2016-11-25 DIAGNOSIS — Z79899 Other long term (current) drug therapy: Secondary | ICD-10-CM | POA: Insufficient documentation

## 2016-11-25 MED ORDER — GUAIFENESIN-DM 100-10 MG/5ML PO SYRP: 5.0000 mL | ORAL_SOLUTION | ORAL | 0 refills | Status: DC | PRN

## 2016-11-25 MED ORDER — PREDNISONE 20 MG PO TABS: 40.0000 mg | ORAL_TABLET | Freq: Every day | ORAL | 0 refills | Status: DC

## 2016-11-25 NOTE — ED Notes (Signed)
MD at bedside. 

## 2016-11-25 NOTE — ED Triage Notes (Addendum)
Pt reports that she has been on abt for for ear infection, bronchitis and sinus infection. She started antibiotics on 11/19/16 given by urgent care. Reports she has been coughing for one week. Ribs hurt and feels generalized achy

## 2016-11-25 NOTE — ED Provider Notes (Signed)
[facility] DEPT Provider Note   CSN: [id number] Arrival date & time: 11/25/16  8127     History   Chief Complaint Chief Complaint  Patient presents with  . Cough    HPI [name] is a 31 y.o. female.  HPI Patient presents with cough. Has had it for the last week and a half. Started on Ceftin initially for treatment of ear infection bronchitis and sinus infection. States she was feeling better but still coughing. States that 3 days later they switched her to [place]. Continues to cough. States she feels bad all over and her bilateral ribs hurt. Slight sputum production of cough. No fevers. She is a smoker. No nausea vomiting. States she developed an itchy rash on her right arm. States that she took Benadryl. States that she when she has had this in the past she has had steroids and cough medicine.  Past Medical History:  Diagnosis Date  . Anxiety   . Asthma   . Depression   . Endometriosis   . History of asthma    with bronchitis  . History of genital warts   . History of kidney stones    passed stones, no surgery required  . History of pelvic inflammatory disease   . Mood disorder (HCC)    UNSPECIFIED  . Pelvic pain in female   . SVD (spontaneous vaginal delivery)     Patient Active Problem List   Diagnosis Date Noted  . Sprain of ankle 02/21/2015  . S/P hysterectomy 06/25/2014  . Pelvic pain in female 08/18/2013    Past Surgical History:  Procedure Laterality Date  . ABDOMINAL HYSTERECTOMY    . ABLATION COLPOCLESIS    . BILATERAL SALPINGECTOMY Bilateral 06/25/2014   Procedure: BILATERAL SALPINGECTOMY;  Surgeon: Cheri Fowler, MD;  Location: [facility];  Service: Gynecology;  Laterality: Bilateral;  . CYSTO WITH HYDRODISTENSION N/A 12/01/2013   Procedure: CYSTOSCOPY/HYDRODISTENSION with instillation of marcaine and pyridium;  Surgeon: Ardis Hughs, MD;  Location[facility];  Service: Urology;  Laterality: N/A;  . CYSTOSCOPY N/A  06/25/2014   Procedure: CYSTOSCOPY;  Surgeon: Cheri Fowler, MD;  Location: [facility];  Service: Gynecology;  Laterality: N/A;  . DX LAPAROSCOPY/ ASPIRATION RIGHT OVARIAN CYST AND BX POSTERIOR CUL-DE-SAC  05-27-2005  . ESSURE TUBAL LIGATION Bilateral 2011  . LAPAROSCOPIC ASSISTED VAGINAL HYSTERECTOMY N/A 06/25/2014   Procedure: LAPAROSCOPIC ASSISTED VAGINAL HYSTERECTOMY;  Surgeon: Cheri Fowler, MD;  Location: [facility];  Service: Gynecology;  Laterality: N/A;  . LAPAROSCOPY N/A 08/18/2013   Procedure: LAPAROSCOPY DIAGNOSTIC, FULGERATION OF ENDOMETROSIS;  Surgeon: Cheri Fowler, MD;  Location: [facility];  Service: Gynecology;  Laterality: N/A;  . WISDOM TOOTH EXTRACTION      OB History    Gravida Para Term Preterm AB Living   2 2 2     2    SAB TAB Ectopic Multiple Live Births                   Home Medications    Prior to Admission medications   Medication Sig Start Date End Date Taking? Authorizing Provider  ALPRAZolam Duanne Moron) 1 MG tablet Take 1 mg by mouth 3 (three) times daily as needed for anxiety.  05/25/16   [provider]  dicyclomine (BENTYL) 20 MG tablet Take 1 tablet (20 mg total) by mouth 2 (two) times daily. Patient not taking: Reported on 10/19/2016 07/24/16   Rancour, Annie Main, MD  doxycycline (VIBRAMYCIN) 100 MG capsule Take 1 capsule (100 mg total)  by mouth 2 (two) times daily. Patient not taking: Reported on 08/29/2016 07/28/16   Waynetta Pean, PA-C  guaiFENesin-dextromethorphan (ROBITUSSIN DM) 100-10 MG/5ML syrup Take 5 mLs by mouth every 4 (four) hours as needed for cough. 11/25/16   Davonna Belling, MD  HYDROcodone-acetaminophen (NORCO/VICODIN) 5-325 MG tablet Take 1-2 tablets by mouth every 4 (four) hours as needed. Patient not taking: Reported on 08/29/2016 08/21/16   Orpah Greek, MD  ibuprofen (ADVIL,MOTRIN) 600 MG tablet Take 1 tablet (600 mg total) by mouth 4 (four) times daily. Patient not taking: Reported on 10/19/2016 09/21/16   Lily Kocher, PA-C    omeprazole (PRILOSEC) 20 MG capsule Take 1 capsule (20 mg total) by mouth daily. 10/20/16   Horton, Barbette Hair, MD  ondansetron (ZOFRAN ODT) 4 MG disintegrating tablet Take 1 tablet (4 mg total) by mouth every 8 (eight) hours as needed for nausea or vomiting. 10/20/16   Horton, Barbette Hair, MD  ondansetron (ZOFRAN) 4 MG tablet Take 1 tablet (4 mg total) by mouth every 8 (eight) hours as needed for nausea or vomiting. Patient not taking: Reported on 08/29/2016 07/24/16   Ezequiel Essex, MD  phenazopyridine (PYRIDIUM) 100 MG tablet Take 1 tablet (100 mg total) by mouth 3 (three) times daily as needed for pain. Patient not taking: Reported on 10/19/2016 09/21/16   Lily Kocher, PA-C  predniSONE (DELTASONE) 20 MG tablet Take 2 tablets (40 mg total) by mouth daily. 11/25/16   Davonna Belling, MD    Family History No family history on file.  Social History Social History  Substance Use Topics  . Smoking status: Current Every Day Smoker    Packs/day: 0.50    Years: 4.00    Types: Cigarettes    Last attempt to quit: 11/27/2009  . Smokeless tobacco: Never Used  . Alcohol use No     Allergies   Biaxin [clarithromycin]; Penicillins; and Toradol [ketorolac tromethamine]   Review of Systems Review of Systems  Constitutional: Positive for fatigue. Negative for fever.  Respiratory: Positive for cough.   Cardiovascular: Positive for chest pain. Negative for palpitations.  Gastrointestinal: Negative for abdominal pain.  Genitourinary: Negative for flank pain.  Skin: Positive for rash.  Neurological: Negative for weakness.  Psychiatric/Behavioral: Negative for confusion.     Physical Exam Updated Vital Signs BP 112/76 (BP Location: Right Arm)   Pulse 85   Temp 97.6 F (36.4 C) (Oral)   Resp 18   LMP 06/15/2014   SpO2 98%   Physical Exam  Constitutional: She appears well-developed.  HENT:  Mouth/Throat: No oropharyngeal exudate.  Eyes: Pupils are equal, round, and reactive to  light.  Neck: Neck supple.  Cardiovascular: Normal rate.   Pulmonary/Chest: She exhibits tenderness.  Patient with no focal lung findings. Tenderness to bilateral anterior lower ribs. No point tenderness.  Abdominal: There is no tenderness.  Musculoskeletal: She exhibits no edema.  Neurological: She is alert.  Skin: Skin is warm.     ED Treatments / Results  Labs (all labs ordered are listed, but only abnormal results are displayed) Labs Reviewed - No data to display  EKG  EKG Interpretation None       Radiology Dg Chest 2 View  Result Date: 11/25/2016 CLINICAL DATA:  On antibiotics for your infection, bronchitis and sinus infection, cough for 1 week, ribs hurt, generalized achiness, history asthma, smoking EXAM: CHEST  2 VIEW COMPARISON:  06/22/2013 FINDINGS: Normal heart size, mediastinal contours, and pulmonary vascularity. Mild peribronchial thickening. No pulmonary infiltrate, pleural effusion,  or pneumothorax. Bones unremarkable. IMPRESSION: Mild bronchitic changes without infiltrate. Electronically Signed   By: Lavonia Dana M.D.   On: 11/25/2016 08:10    Procedures Procedures (including critical care time)  Medications Ordered in ED Medications - No data to display   Initial Impression / Assessment and Plan / ED Course  I have reviewed the triage vital signs and the nursing notes.  Pertinent labs & imaging results that were available during my care of the patient were reviewed by me and considered in my medical decision making (see chart for details).     Patient with shortness of breath and chest pain with cough. Has been on antibiotics without relief. Likely viral. Given steroids. X-ray shows only bronchitis. Doubt pulmonary embolism. Discharge home with antitussives also.  Final Clinical Impressions(s) / ED Diagnoses   Final diagnoses:  Bronchitis    New Prescriptions Discharge Medication List as of 11/25/2016  8:32 AM    START taking these medications    Details  guaiFENesin-dextromethorphan (ROBITUSSIN DM) 100-10 MG/5ML syrup Take 5 mLs by mouth every 4 (four) hours as needed for cough., Starting Wed 11/25/2016, Print    predniSONE (DELTASONE) 20 MG tablet Take 2 tablets (40 mg total) by mouth daily., Starting Wed 11/25/2016, Print         Davonna Belling, MD 11/25/16 [phone]

## 2017-03-08 ENCOUNTER — Other Ambulatory Visit: Payer: Self-pay

## 2017-03-08 DIAGNOSIS — Z1231 Encounter for screening mammogram for malignant neoplasm of breast: Secondary | ICD-10-CM

## 2017-05-18 ENCOUNTER — Encounter (HOSPITAL_BASED_OUTPATIENT_CLINIC_OR_DEPARTMENT_OTHER): Payer: Self-pay

## 2017-05-18 ENCOUNTER — Other Ambulatory Visit: Payer: Self-pay

## 2017-05-18 ENCOUNTER — Emergency Department (HOSPITAL_BASED_OUTPATIENT_CLINIC_OR_DEPARTMENT_OTHER)
Admission: EM | Admit: 2017-05-18 | Discharge: 2017-05-18 | Disposition: A | Discharge status: Home / Self Care | Payer: Self-pay | Attending: Emergency Medicine | Admitting: Emergency Medicine

## 2017-05-18 DIAGNOSIS — Z5321 Procedure and treatment not carried out due to patient leaving prior to being seen by health care provider: Secondary | ICD-10-CM | POA: Insufficient documentation

## 2017-05-18 NOTE — ED Triage Notes (Signed)
C/o flu lie sx day 3-NAD-steady gait

## 2017-05-18 NOTE — ED Notes (Signed)
After triage pt asked about wait-notified of approx wait time-states "I'll probably leave and come back in the morning"-asked to let staff know if she leaves-NAD-steady gait

## 2018-01-13 IMAGING — CT CT ABD-PELV W/ CM
2 of 4 series · 16 of 46 positions shown, 18 images · IV contrast (Isovue)
Comparison: CT of the abdomen and pelvis performed 06/24/2016

CLINICAL DATA: Acute onset of worsening suprapubic abdominal pain.
Initial encounter.

EXAM:
CT ABDOMEN AND PELVIS WITH CONTRAST
TECHNIQUE: Multidetector CT imaging of the abdomen and pelvis was performed
using the standard protocol following bolus administration of
intravenous contrast.
CONTRAST:  100mL 6I2KPI-EHH IOPAMIDOL (6I2KPI-EHH) INJECTION 61%

[Series 2: axial st · axial · 0.93mm/px · z∈[+314,+714]mm · 13 of 89 slices shown, 15 images]
[im 5/89  soft-tissue]
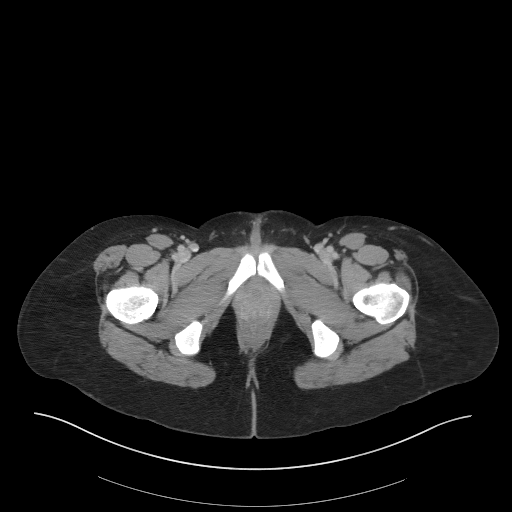
[im 5/89  bone]
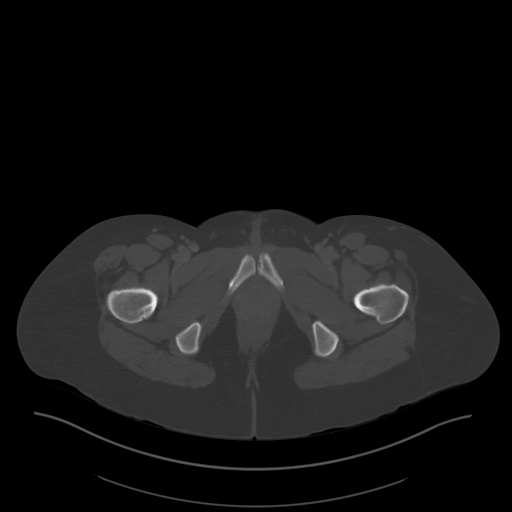
[im 13/89  soft-tissue]
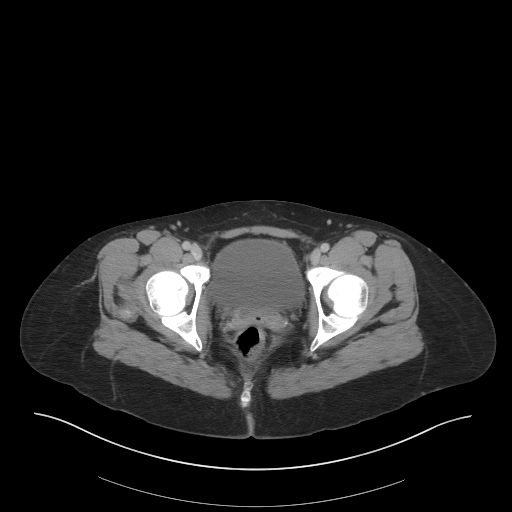
[im 21/89  soft-tissue]
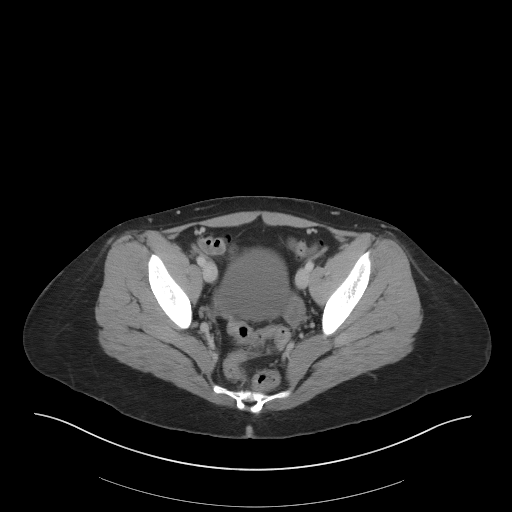
[im 25/89  soft-tissue]
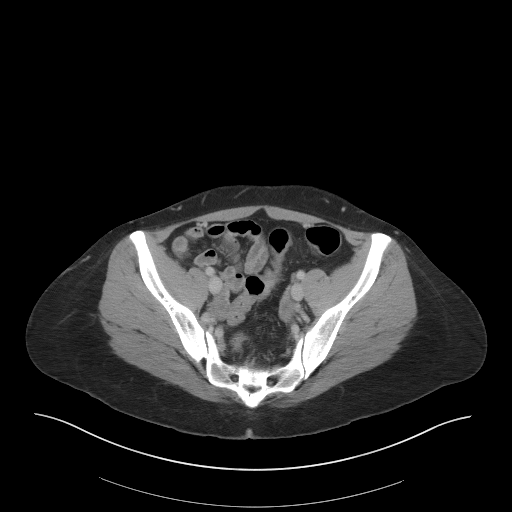
[im 33/89  soft-tissue]
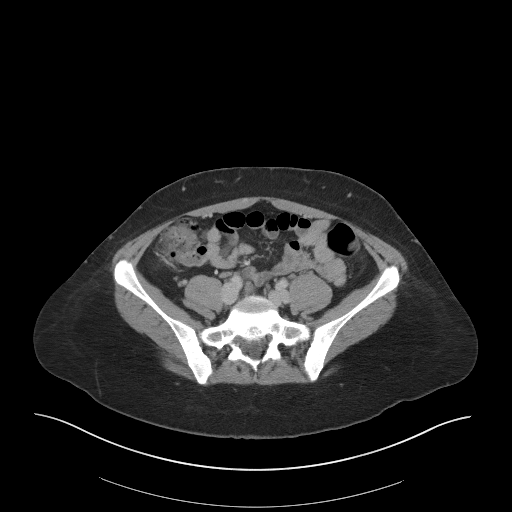
[im 37/89  soft-tissue]
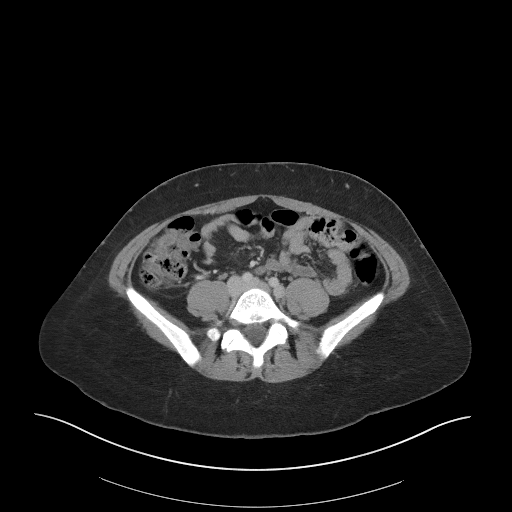
[im 45/89  soft-tissue]
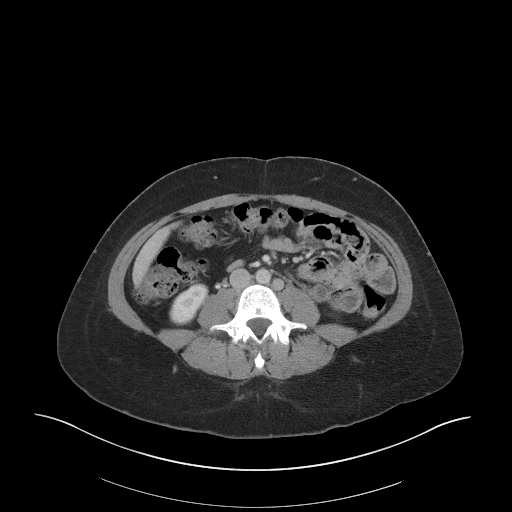
[im 53/89  soft-tissue]
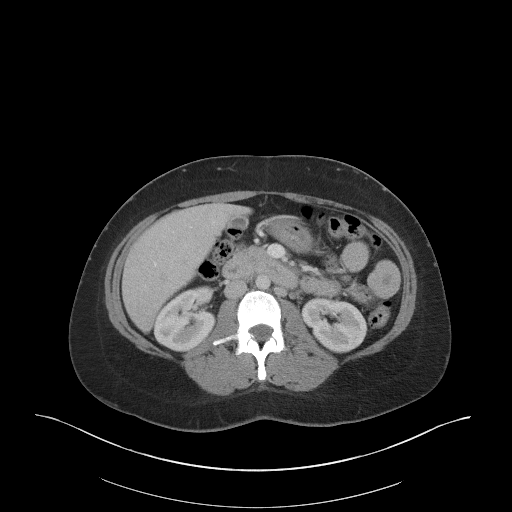
[im 57/89  soft-tissue]
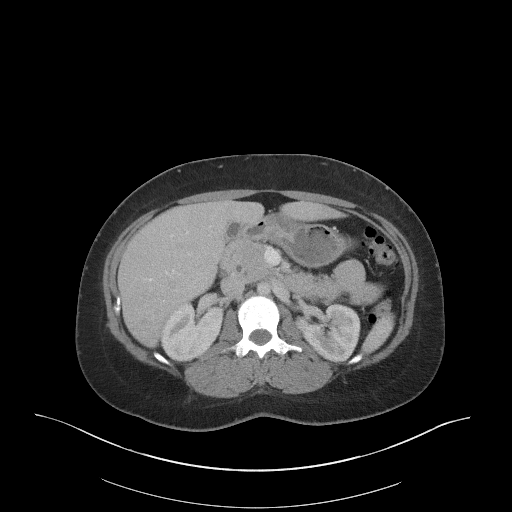
[im 57/89  bone]
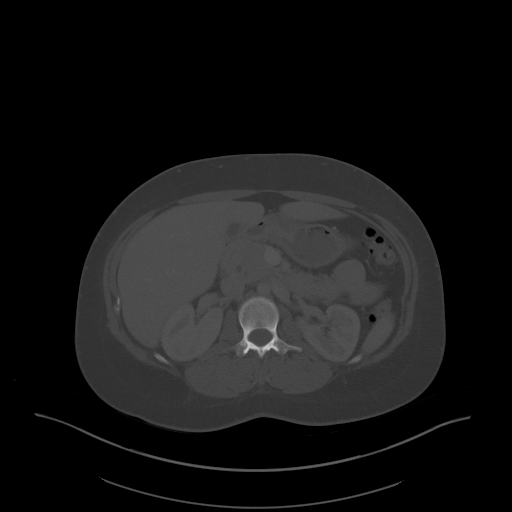
[im 65/89  soft-tissue]
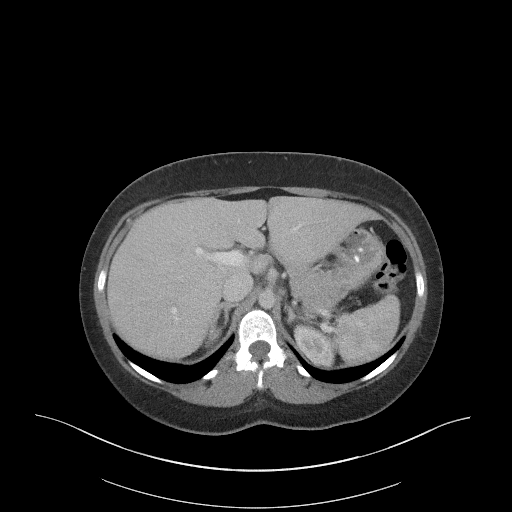
[im 69/89  soft-tissue]
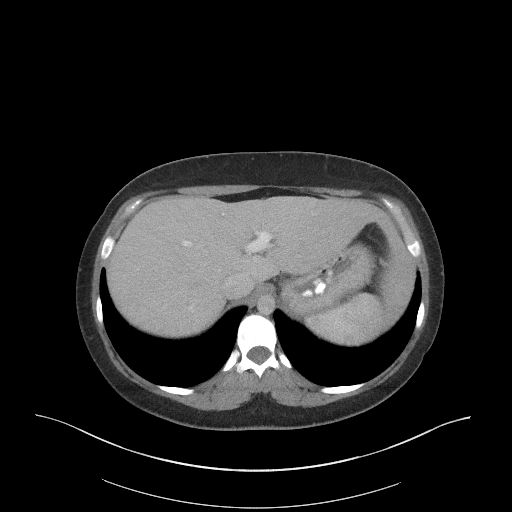
[im 77/89  soft-tissue]
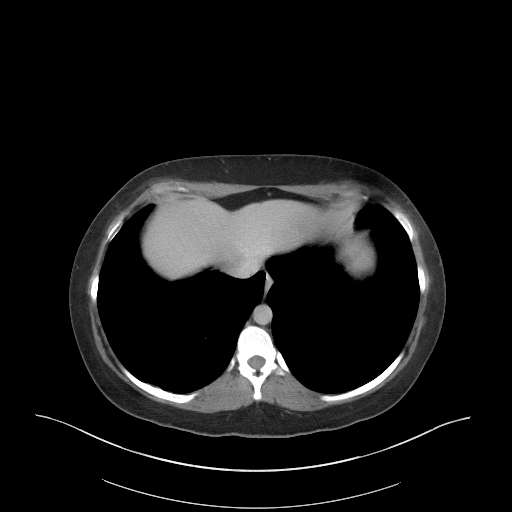
[im 85/89  soft-tissue]
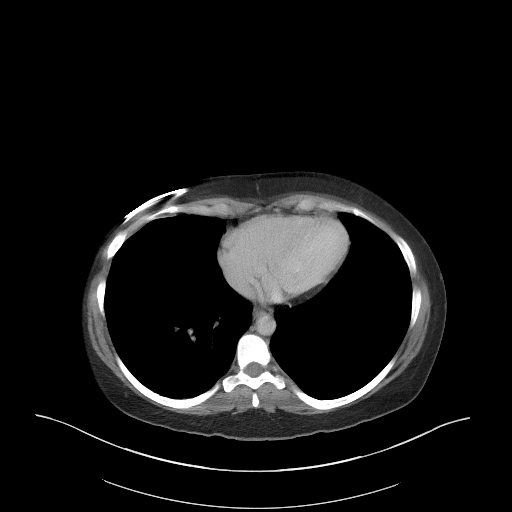

[Series 5: coronal st · coronal · 0.77mm/px · 3 of 90 slices shown]
[im 30/90  soft-tissue]
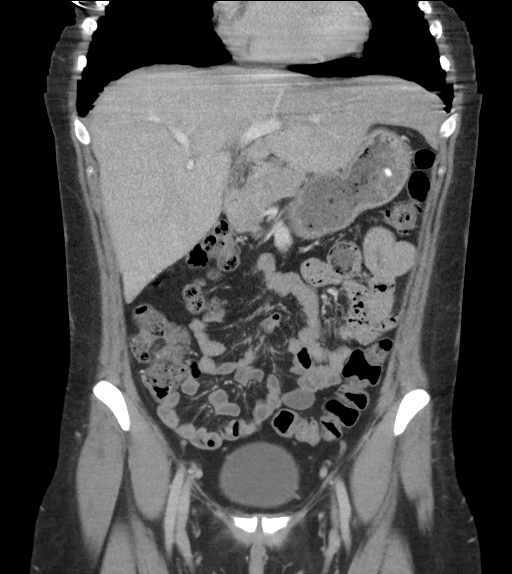
[im 40/90  soft-tissue]
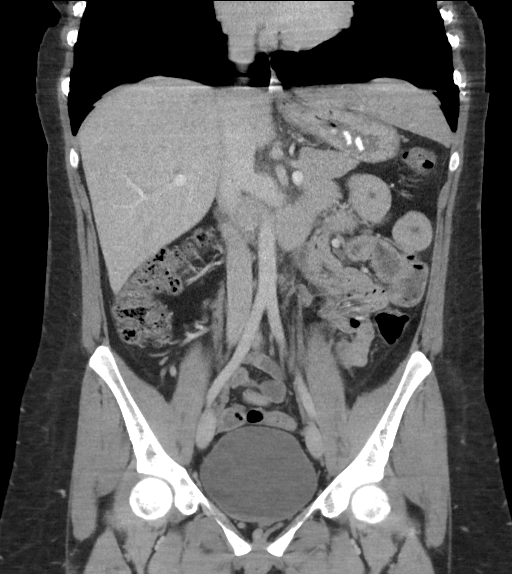
[im 50/90  soft-tissue]
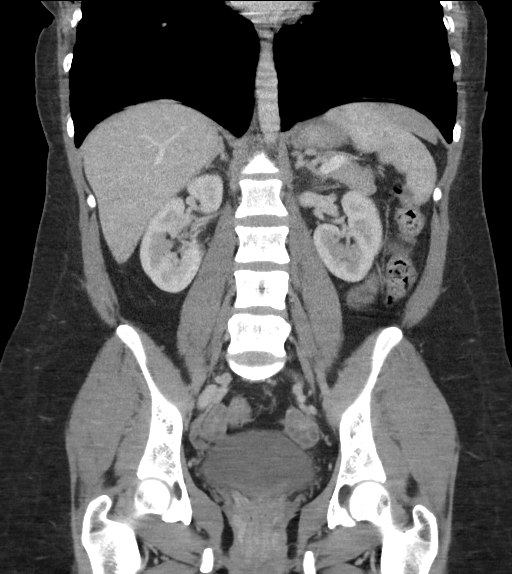

[16 of 46 positions shown; findings below may reference images not displayed]

FINDINGS: Lower chest: The visualized lung bases are grossly clear. The
visualized portions of the mediastinum are unremarkable.

Hepatobiliary: The liver is unremarkable in appearance. The
gallbladder is unremarkable in appearance. The common bile duct
remains normal in caliber.

Pancreas: The pancreas is within normal limits.

Spleen: The spleen is unremarkable in appearance.

Adrenals/Urinary Tract: The adrenal glands are unremarkable in
appearance. The kidneys are within normal limits. There is no
evidence of hydronephrosis. No renal or ureteral stones are
identified. No perinephric stranding is seen.

Stomach/Bowel: The stomach is unremarkable in appearance. The small
bowel is within normal limits. The appendix is normal in caliber,
without evidence of appendicitis. The colon is unremarkable in
appearance.

Vascular/Lymphatic: The abdominal aorta is unremarkable in
appearance. The inferior vena cava is grossly unremarkable. No
retroperitoneal lymphadenopathy is seen. No pelvic sidewall
lymphadenopathy is identified.

Reproductive: The bladder is mildly distended and grossly
unremarkable. The patient is status post hysterectomy. No suspicious
adnexal masses are seen.

Other: No additional soft tissue abnormalities are seen.

Musculoskeletal: No acute osseous abnormalities are identified. The
visualized musculature is unremarkable in appearance.
IMPRESSION: Unremarkable contrast-enhanced CT of the abdomen and pelvis.

## 2018-06-02 IMAGING — US US PELVIS COMPLETE
1 series · 14 of 25 positions shown · non-contrast
Comparison: CT 07/24/2016

CLINICAL DATA: Left lower quadrant pain, vaginal bleeding

EXAM:
TRANSABDOMINAL AND TRANSVAGINAL ULTRASOUND OF PELVIS
TECHNIQUE: Both transabdominal and transvaginal ultrasound examinations of the
pelvis were performed. Transabdominal technique was performed for
global imaging of the pelvis including uterus, ovaries, adnexal
regions, and pelvic cul-de-sac. It was necessary to proceed with
endovaginal exam following the transabdominal exam to visualize the
ovaries and adnexa.

[Series 1: us pelvis complete · 0.12mm/px · 14 of 92 slices shown]
[im 1/92]
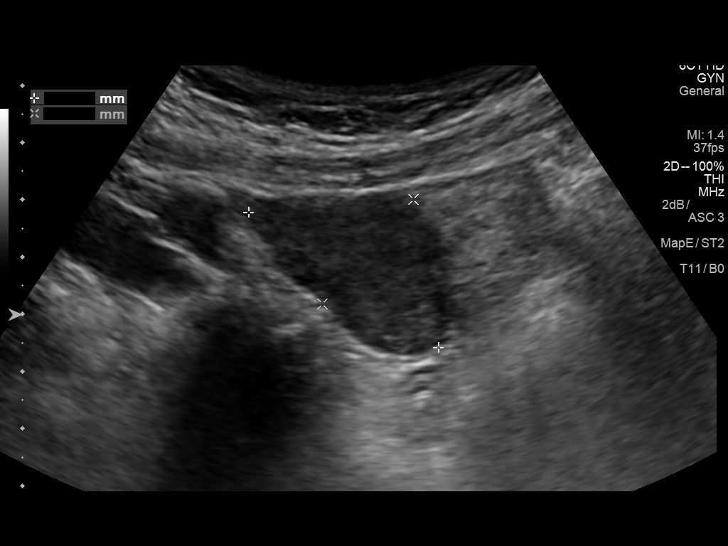
[im 8/92]
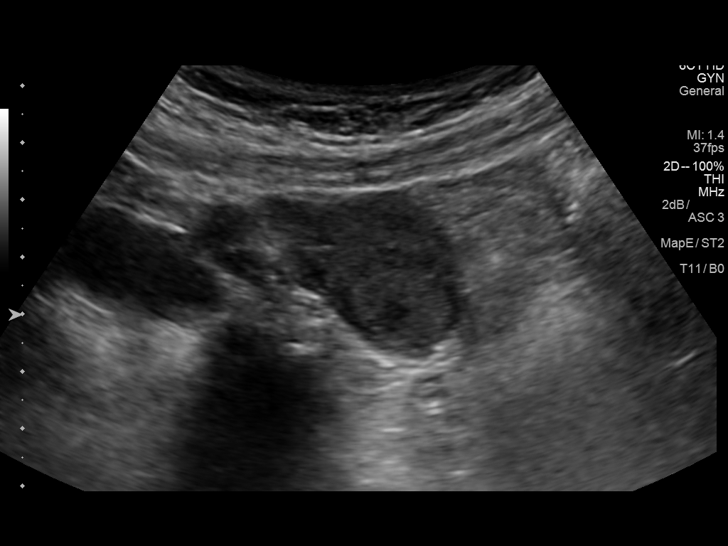
[im 16/92]
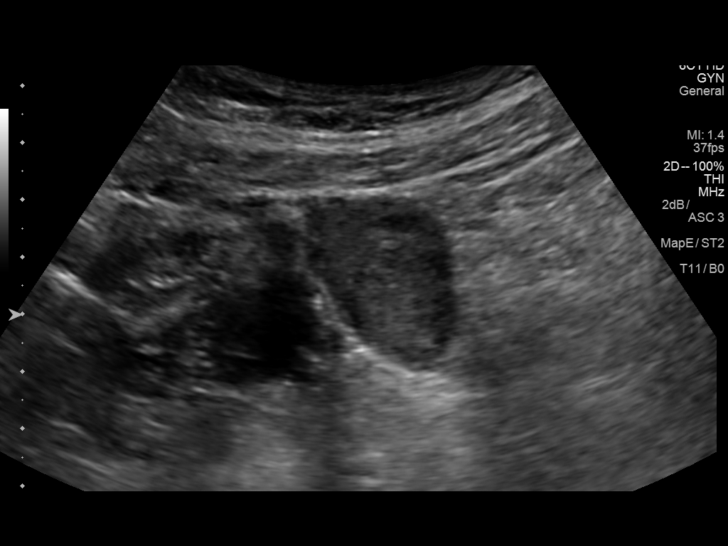
[im 23/92]
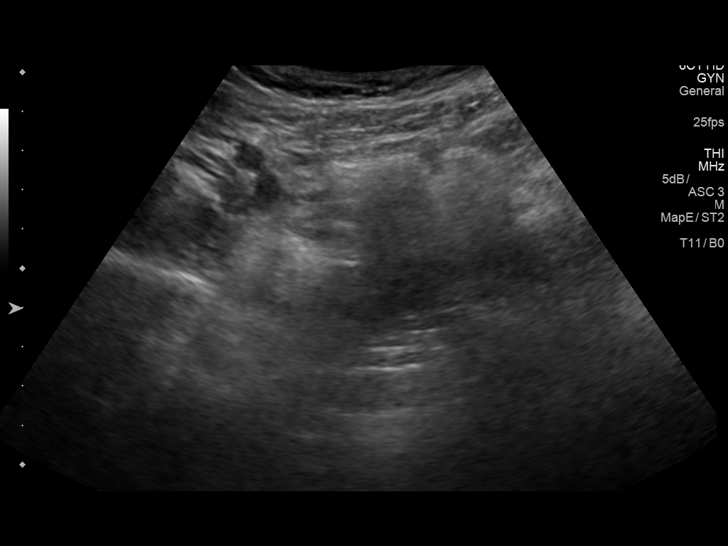
[im 31/92]
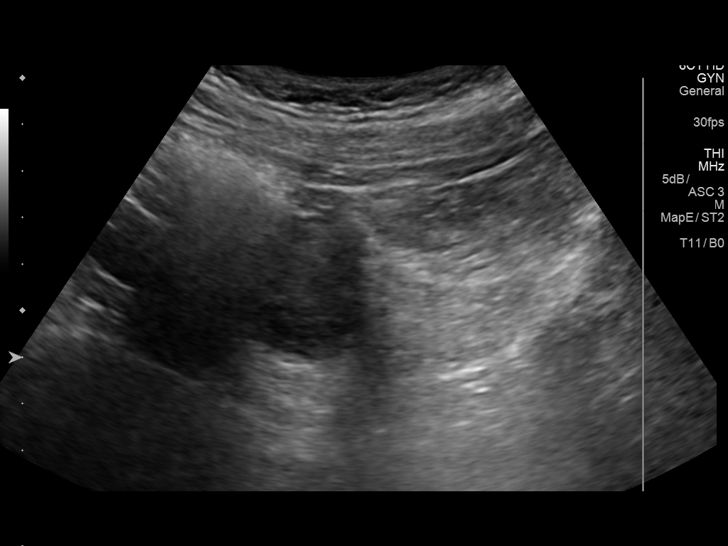
[im 35/92]
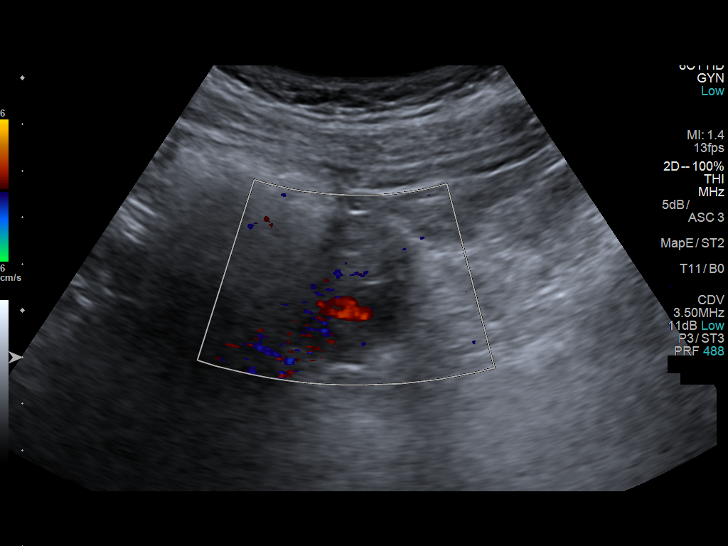
[im 42/92]
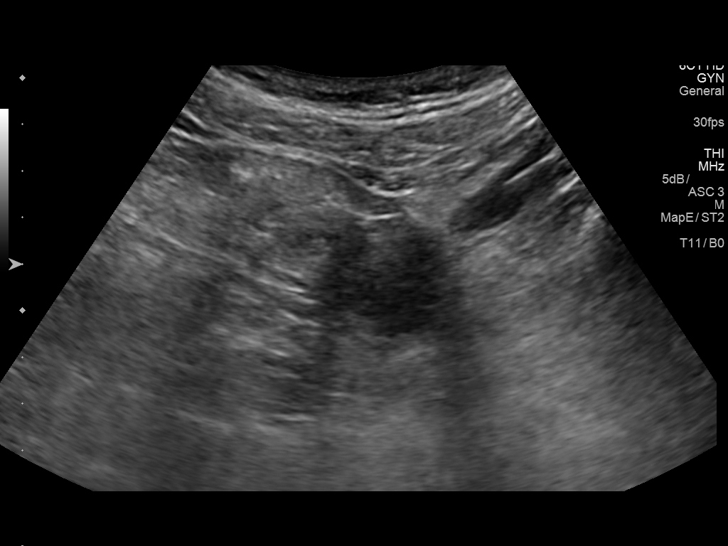
[im 50/92]
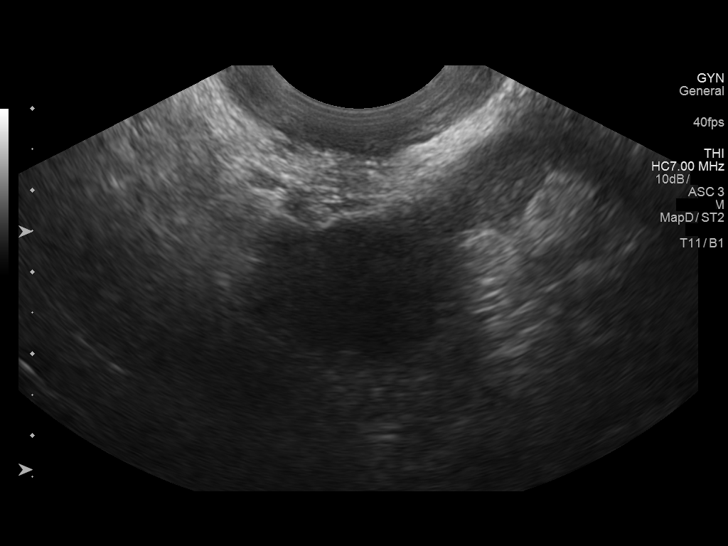
[im 57/92]
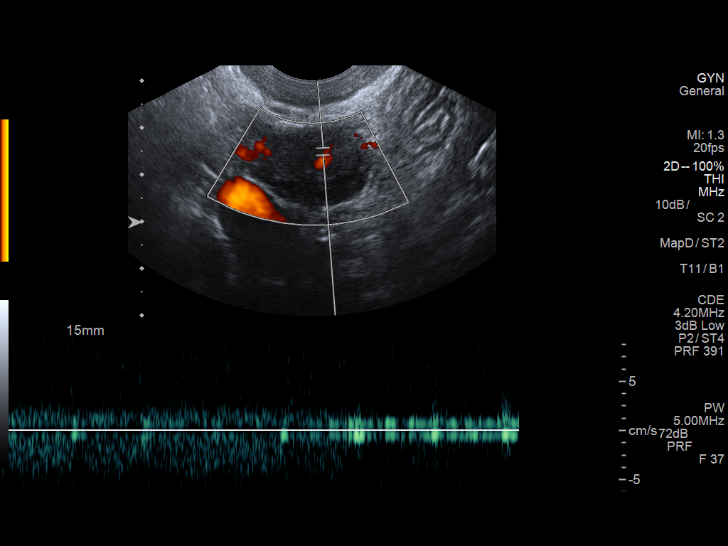
[im 61/92]
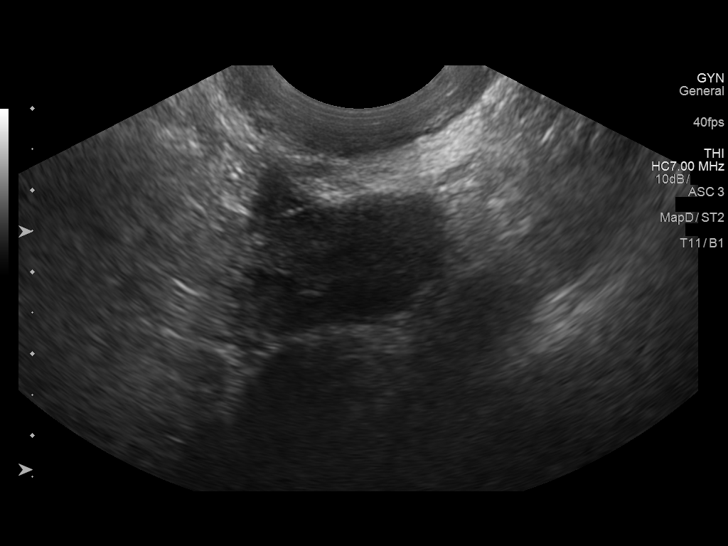
[im 69/92]
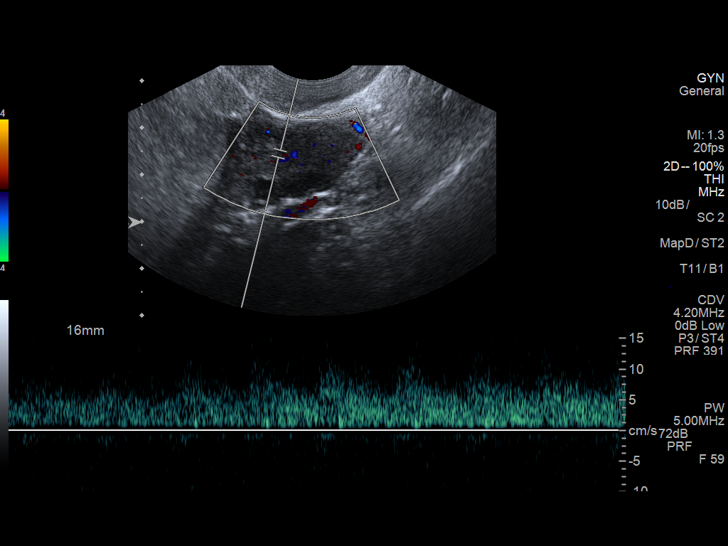
[im 76/92]
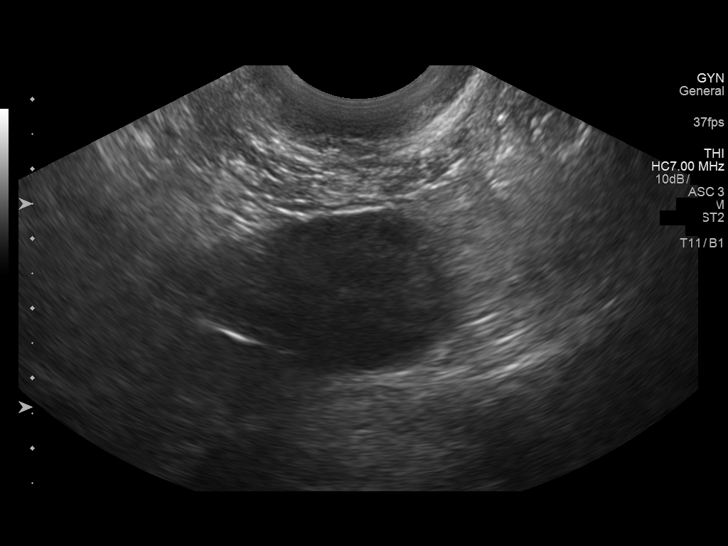
[im 84/92]
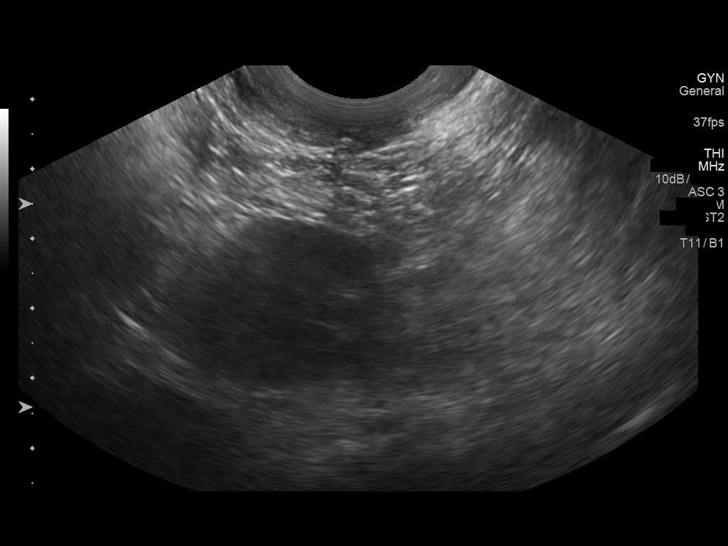
[im 92/92]
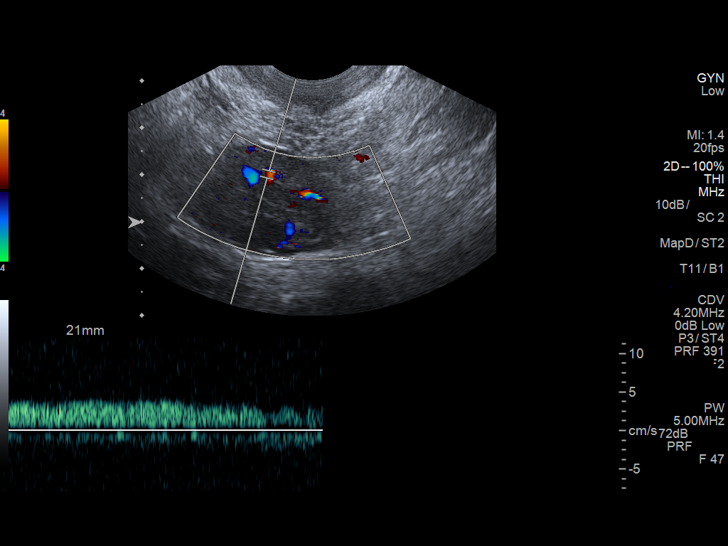

[14 of 25 positions shown; findings below may reference images not displayed]

FINDINGS: Uterus

Measurements: Prior hysterectomy.

Endometrium

Thickness:  N/A.

Right ovary

Measurements: 4.1 x 2.4 x 2.6 cm. Normal appearance/no adnexal mass.

Left ovary

Measurements: 2.8 x 1.9 x 2.9 cm. Normal appearance/no adnexal mass.

Other findings

No abnormal free fluid.
IMPRESSION: Prior hysterectomy.  No acute findings.

## 2019-07-12 ENCOUNTER — Other Ambulatory Visit: Payer: Self-pay

## 2019-07-12 ENCOUNTER — Encounter (HOSPITAL_BASED_OUTPATIENT_CLINIC_OR_DEPARTMENT_OTHER): Payer: Self-pay | Admitting: Obstetrics and Gynecology

## 2019-07-12 NOTE — Progress Notes (Signed)
Spoke w/ via phone for pre-op interview---patient Lab needs dos----cbc, bmet, type and screen              COVID test ------arrive 600 am 07-13-2019 for rapid covid test Arrive at -------600 am 07-13-2019 NPO after -----midnight- Medications to take morning of surgery -----naproxen Diabetic medication -----n/a Patient Special Instructions -----none Pre-Op special Istructions -----none Patient verbalized understanding of instructions that were given at this phone interview. Patient denies shortness of breath, chest pain, fever, cough a this phone interview.

## 2019-07-12 NOTE — H&P (Signed)
[name] is an 34 y.o. female. G 4 P 2 comes in for laproscopic removal of right ovary and cystoscopy.      Previous LAVH for pelvic pain.  One and one half week of RLQ pain.  Seen in ER.  CT and sono showed right ovarian cyst.  Pain worse now for above noted surgery.  Sono in office confirmed 6.3 right ovarian cyst.  Pertinent Gynecological History: Menses: prior PAVH Bleeding: none Contraception: none DES exposure: denies Blood transfusions: none Sexually transmitted diseases: no past history Previous GYN Procedures: LAVH  Last mammogram: unknown Date:   Last pap: unknown Date:   OB History: G4, P2   Menstrual History: Menarche age: 52 Patient's last menstrual period was 06/15/2014.    Past Medical History:  Diagnosis Date  . Anxiety   . Asthma   . Depression   . Endometriosis   . History of asthma    with bronchitis  . History of genital warts   . History of kidney stones    passed stones, no surgery required  . History of pelvic inflammatory disease   . Mood disorder (HCC)    UNSPECIFIED  . Pelvic pain in female   . SVD (spontaneous vaginal delivery)     Past Surgical History:  Procedure Laterality Date  . ABDOMINAL HYSTERECTOMY    . ABLATION COLPOCLESIS    . BILATERAL SALPINGECTOMY Bilateral 06/25/2014   Procedure: BILATERAL SALPINGECTOMY;  Surgeon: Cheri Fowler, MD;  Location: [facility];  Service: Gynecology;  Laterality: Bilateral;  . CYSTO WITH HYDRODISTENSION N/A 12/01/2013   Procedure: CYSTOSCOPY/HYDRODISTENSION with instillation of marcaine and pyridium;  Surgeon: Ardis Hughs, MD;  Location: [facility];  Service: Urology;  Laterality: N/A;  . CYSTOSCOPY N/A 06/25/2014   Procedure: CYSTOSCOPY;  Surgeon: Cheri Fowler, MD;  Location: [facility];  Service: Gynecology;  Laterality: N/A;  . DX LAPAROSCOPY/ ASPIRATION RIGHT OVARIAN CYST AND BX POSTERIOR CUL-DE-SAC  05-27-2005  . ESSURE TUBAL LIGATION Bilateral 2011  . LAPAROSCOPIC  ASSISTED VAGINAL HYSTERECTOMY N/A 06/25/2014   Procedure: LAPAROSCOPIC ASSISTED VAGINAL HYSTERECTOMY;  Surgeon: Cheri Fowler, MD;  Location: [facility];  Service: Gynecology;  Laterality: N/A;  . LAPAROSCOPY N/A 08/18/2013   Procedure: LAPAROSCOPY DIAGNOSTIC, FULGERATION OF ENDOMETROSIS;  Surgeon: Cheri Fowler, MD;  Location: [facility];  Service: Gynecology;  Laterality: N/A;  . WISDOM TOOTH EXTRACTION      No family history on file.  Social History:  reports that she has been smoking cigarettes. She has a 2.00 pack-year smoking history. She has never used smokeless tobacco. She reports that she does not drink alcohol or use drugs.  Allergies:  Allergies  Allergen Reactions  . Biaxin [Clarithromycin] Rash  . Penicillins Hives and Rash    Has patient had a PCN reaction causing immediate rash, facial/tongue/throat swelling, SOB or lightheadedness with hypotension: Yes Has patient had a PCN reaction causing severe rash involving mucus membranes or skin necrosis: Yes Has patient had a PCN reaction that required hospitalization No Has patient had a PCN reaction occurring within the last 10 years: No If all of the above answers are "NO", then may proceed with Cephalosporin use.   . Toradol [Ketorolac Tromethamine] Rash    Tolerates ibuprofen    No medications prior to admission.    Review of Systems  Constitutional: Negative.   HENT: Negative.   Eyes: Negative.   Respiratory: Negative.   Cardiovascular: Negative.   Gastrointestinal: Positive for abdominal pain and nausea.  Endocrine: Negative.  Genitourinary: Positive for pelvic pain.  Musculoskeletal: Negative.   Skin: Negative.   Allergic/Immunologic: Negative.   Neurological: Negative.   Hematological: Negative.   Psychiatric/Behavioral: Negative.     Last menstrual period 06/15/2014. Physical Exam  Constitutional: She is oriented to person, place, and time. She appears well-developed and well-nourished.  HENT:  Head:  Normocephalic.  Eyes: Pupils are equal, round, and reactive to light. Conjunctivae and EOM are normal.  Cardiovascular: Normal rate, regular rhythm and normal heart sounds.  Respiratory: Effort normal and breath sounds normal.  GI: There is abdominal tenderness. There is no rebound.  Genitourinary:    Genitourinary Comments: Uterus surgically absent.  Right side mass with tenderness   Musculoskeletal:        General: Normal range of motion.  Neurological: She is alert and oriented to person, place, and time.    No results found for this or any previous visit (from the past 24 hour(s)).  No results found.  Assessment/Plan:Increasing pain from right ovarian cyst.  Precede with laproscopic removal of right ovary and cystoscopy.  Risk discussed including.  Blood lost that could require transfusion with risk of AIDS and hepatitis. Infection.  Injury to adjacent organs requiring furhter surgery,  DVT and PE  Arvella Nigh 07/12/2019, 4:16 PM

## 2019-07-13 ENCOUNTER — Encounter (HOSPITAL_BASED_OUTPATIENT_CLINIC_OR_DEPARTMENT_OTHER): Payer: Self-pay | Admitting: Obstetrics and Gynecology

## 2019-07-13 ENCOUNTER — Ambulatory Visit (HOSPITAL_BASED_OUTPATIENT_CLINIC_OR_DEPARTMENT_OTHER): Payer: BLUE CROSS/BLUE SHIELD | Admitting: Anesthesiology

## 2019-07-13 ENCOUNTER — Ambulatory Visit (HOSPITAL_BASED_OUTPATIENT_CLINIC_OR_DEPARTMENT_OTHER)
Admission: RE | Admit: 2019-07-13 | Discharge: 2019-07-13 | Disposition: A | Discharge status: Home / Self Care | Payer: BLUE CROSS/BLUE SHIELD | Source: Ambulatory Visit | Attending: Obstetrics and Gynecology | Admitting: Obstetrics and Gynecology

## 2019-07-13 ENCOUNTER — Other Ambulatory Visit: Payer: Self-pay

## 2019-07-13 ENCOUNTER — Encounter (HOSPITAL_BASED_OUTPATIENT_CLINIC_OR_DEPARTMENT_OTHER)
Admission: RE | Disposition: A | Discharge status: Home / Self Care | Payer: Self-pay | Source: Ambulatory Visit | Attending: Obstetrics and Gynecology

## 2019-07-13 DIAGNOSIS — N858 Other specified noninflammatory disorders of uterus: Secondary | ICD-10-CM | POA: Diagnosis not present

## 2019-07-13 DIAGNOSIS — F17210 Nicotine dependence, cigarettes, uncomplicated: Secondary | ICD-10-CM | POA: Diagnosis not present

## 2019-07-13 DIAGNOSIS — N83201 Unspecified ovarian cyst, right side: Secondary | ICD-10-CM | POA: Diagnosis present

## 2019-07-13 DIAGNOSIS — Z880 Allergy status to penicillin: Secondary | ICD-10-CM | POA: Insufficient documentation

## 2019-07-13 DIAGNOSIS — Z20822 Contact with and (suspected) exposure to covid-19: Secondary | ICD-10-CM | POA: Insufficient documentation

## 2019-07-13 HISTORY — DX: Other complications of anesthesia, initial encounter: T88.59XA

## 2019-07-13 HISTORY — PX: LAPAROSCOPIC SALPINGO OOPHERECTOMY: SHX5927

## 2019-07-13 HISTORY — PX: CYSTOSCOPY: SHX5120

## 2019-07-13 LAB — BASIC METABOLIC PANEL
Anion gap: 9 (ref 5–15)
BUN: 19 mg/dL (ref 6–20)
CO2: 23 mmol/L (ref 22–32)
Calcium: 8.4 mg/dL — ABNORMAL LOW (ref 8.9–10.3)
Chloride: 105 mmol/L (ref 98–111)
Creatinine, Ser: 0.65 mg/dL (ref 0.44–1.00)
GFR calc Af Amer: >60 mL/min (ref >60)
GFR calc non Af Amer: >60 mL/min (ref >60)
Glucose, Bld: 100 mg/dL — ABNORMAL HIGH (ref 70–99)
Potassium: 4.2 mmol/L (ref 3.5–5.1)
Sodium: 137 mmol/L (ref 135–145)

## 2019-07-13 LAB — CBC
HCT: 40.7 % (ref 36.0–46.0)
Hemoglobin: 13.6 g/dL (ref 12.0–15.0)
MCH: 34.7 pg — ABNORMAL HIGH (ref 26.0–34.0)
MCHC: 33.4 g/dL (ref 30.0–36.0)
MCV: 103.8 fL — ABNORMAL HIGH (ref 80.0–100.0)
Platelets: 291 10*3/uL (ref 150–400)
RBC: 3.92 MIL/uL (ref 3.87–5.11)
RDW: 13.5 % (ref 11.5–15.5)
WBC: 7.3 10*3/uL (ref 4.0–10.5)
nRBC: 0 % (ref 0.0–0.2)

## 2019-07-13 LAB — RESPIRATORY PANEL BY RT PCR (FLU A&B, COVID)
Influenza A by PCR: NEGATIVE
Influenza B by PCR: NEGATIVE
SARS Coronavirus 2 by RT PCR: NEGATIVE

## 2019-07-13 LAB — TYPE AND SCREEN
ABO/RH(D): A POS
Antibody Screen: NEGATIVE

## 2019-07-13 LAB — ABO/RH: ABO/RH(D): A POS

## 2019-07-13 SURGERY — SALPINGO-OOPHORECTOMY, LAPAROSCOPIC
Anesthesia: General | Site: Bladder

## 2019-07-13 MED ORDER — SCOPOLAMINE 1 MG/3DAYS TD PT72
MEDICATED_PATCH | TRANSDERMAL | Status: AC
  Filled 2019-07-13: qty 1

## 2019-07-13 MED ORDER — ACETAMINOPHEN 325 MG PO TABS
ORAL_TABLET | ORAL | Status: DC | PRN
  Administered 2019-07-13: 1000 mg via ORAL

## 2019-07-13 MED ORDER — FLUORESCEIN SODIUM 10 % IV SOLN
INTRAVENOUS | Status: DC | PRN
  Administered 2019-07-13: 100 mg via INTRAVENOUS

## 2019-07-13 MED ORDER — BUPIVACAINE HCL 0.25 % IJ SOLN
INTRAMUSCULAR | Status: DC | PRN
  Administered 2019-07-13: 18 mL

## 2019-07-13 MED ORDER — SUGAMMADEX SODIUM 200 MG/2ML IV SOLN
INTRAVENOUS | Status: DC | PRN
  Administered 2019-07-13: 200 mg via INTRAVENOUS

## 2019-07-13 MED ORDER — OXYCODONE HCL 5 MG PO TABS
5.0000 mg | ORAL_TABLET | Freq: Once | ORAL | Status: AC | PRN
  Administered 2019-07-13: 5 mg via ORAL
  Filled 2019-07-13: qty 1

## 2019-07-13 MED ORDER — MIDAZOLAM HCL 2 MG/2ML IJ SOLN
INTRAMUSCULAR | Status: DC | PRN
  Administered 2019-07-13: 2 mg via INTRAVENOUS

## 2019-07-13 MED ORDER — ONDANSETRON HCL 4 MG/2ML IJ SOLN
INTRAMUSCULAR | Status: DC | PRN
  Administered 2019-07-13: 4 mg via INTRAVENOUS

## 2019-07-13 MED ORDER — ROCURONIUM BROMIDE 10 MG/ML (PF) SYRINGE
PREFILLED_SYRINGE | INTRAVENOUS | Status: DC | PRN
  Administered 2019-07-13: 50 mg via INTRAVENOUS

## 2019-07-13 MED ORDER — ONDANSETRON HCL 4 MG/2ML IJ SOLN
4.0000 mg | Freq: Four times a day (QID) | INTRAMUSCULAR | Status: DC | PRN
  Filled 2019-07-13: qty 2

## 2019-07-13 MED ORDER — SCOPOLAMINE 1 MG/3DAYS TD PT72
MEDICATED_PATCH | TRANSDERMAL | Status: DC | PRN
  Administered 2019-07-13: 1 via TRANSDERMAL

## 2019-07-13 MED ORDER — CLINDAMYCIN PHOSPHATE 900 MG/50ML IV SOLN
900.0000 mg | INTRAVENOUS | Status: AC
  Administered 2019-07-13: 900 mg via INTRAVENOUS
  Filled 2019-07-13: qty 50

## 2019-07-13 MED ORDER — TRAMADOL-ACETAMINOPHEN 37.5-325 MG PO TABS: 1.0000 | ORAL_TABLET | Freq: Four times a day (QID) | ORAL | 0 refills | Status: AC | PRN

## 2019-07-13 MED ORDER — KETOROLAC TROMETHAMINE 30 MG/ML IJ SOLN
INTRAMUSCULAR | Status: AC
  Filled 2019-07-13: qty 1

## 2019-07-13 MED ORDER — FENTANYL CITRATE (PF) 100 MCG/2ML IJ SOLN
25.0000 ug | INTRAMUSCULAR | Status: DC | PRN
  Administered 2019-07-13: 50 ug via INTRAVENOUS
  Filled 2019-07-13: qty 1

## 2019-07-13 MED ORDER — FENTANYL CITRATE (PF) 100 MCG/2ML IJ SOLN
INTRAMUSCULAR | Status: DC | PRN
  Administered 2019-07-13 (×2): 50 ug via INTRAVENOUS

## 2019-07-13 MED ORDER — PROPOFOL 10 MG/ML IV BOLUS
INTRAVENOUS | Status: DC | PRN
  Administered 2019-07-13: 190 mg via INTRAVENOUS

## 2019-07-13 MED ORDER — DEXAMETHASONE SODIUM PHOSPHATE 10 MG/ML IJ SOLN
INTRAMUSCULAR | Status: AC
  Filled 2019-07-13: qty 1

## 2019-07-13 MED ORDER — SODIUM CHLORIDE 0.9 % IR SOLN
Status: DC | PRN
  Administered 2019-07-13: 3000 mL

## 2019-07-13 MED ORDER — LIDOCAINE 2% (20 MG/ML) 5 ML SYRINGE
INTRAMUSCULAR | Status: AC
  Filled 2019-07-13: qty 5

## 2019-07-13 MED ORDER — ACETAMINOPHEN 500 MG PO TABS
ORAL_TABLET | ORAL | Status: AC
  Filled 2019-07-13: qty 2

## 2019-07-13 MED ORDER — FENTANYL CITRATE (PF) 100 MCG/2ML IJ SOLN
INTRAMUSCULAR | Status: AC
  Filled 2019-07-13: qty 2

## 2019-07-13 MED ORDER — DEXAMETHASONE SODIUM PHOSPHATE 10 MG/ML IJ SOLN
INTRAMUSCULAR | Status: DC | PRN
  Administered 2019-07-13: 10 mg via INTRAVENOUS

## 2019-07-13 MED ORDER — CLINDAMYCIN PHOSPHATE 900 MG/50ML IV SOLN
INTRAVENOUS | Status: AC
  Filled 2019-07-13: qty 50

## 2019-07-13 MED ORDER — CIPROFLOXACIN IN D5W 400 MG/200ML IV SOLN
INTRAVENOUS | Status: AC
  Filled 2019-07-13: qty 200

## 2019-07-13 MED ORDER — OXYCODONE HCL 5 MG PO TABS
ORAL_TABLET | ORAL | Status: AC
  Filled 2019-07-13: qty 1

## 2019-07-13 MED ORDER — ROCURONIUM BROMIDE 10 MG/ML (PF) SYRINGE
PREFILLED_SYRINGE | INTRAVENOUS | Status: AC
  Filled 2019-07-13: qty 10

## 2019-07-13 MED ORDER — LACTATED RINGERS IV SOLN
INTRAVENOUS | Status: DC
  Filled 2019-07-13: qty 1000

## 2019-07-13 MED ORDER — LIDOCAINE 2% (20 MG/ML) 5 ML SYRINGE
INTRAMUSCULAR | Status: DC | PRN
  Administered 2019-07-13: 80 mg via INTRAVENOUS

## 2019-07-13 MED ORDER — DEXMEDETOMIDINE HCL IN NACL 200 MCG/50ML IV SOLN
INTRAVENOUS | Status: AC
  Filled 2019-07-13: qty 50

## 2019-07-13 MED ORDER — DEXMEDETOMIDINE HCL IN NACL 400 MCG/100ML IV SOLN
INTRAVENOUS | Status: DC | PRN
  Administered 2019-07-13: 8 ug via INTRAVENOUS
  Administered 2019-07-13: 4 ug via INTRAVENOUS
  Administered 2019-07-13: 12 ug via INTRAVENOUS

## 2019-07-13 MED ORDER — CIPROFLOXACIN IN D5W 400 MG/200ML IV SOLN
400.0000 mg | INTRAVENOUS | Status: AC
  Administered 2019-07-13: 400 mg via INTRAVENOUS
  Filled 2019-07-13: qty 200

## 2019-07-13 MED ORDER — MIDAZOLAM HCL 2 MG/2ML IJ SOLN
INTRAMUSCULAR | Status: AC
  Filled 2019-07-13: qty 2

## 2019-07-13 MED ORDER — ONDANSETRON HCL 4 MG/2ML IJ SOLN
INTRAMUSCULAR | Status: AC
  Filled 2019-07-13: qty 2

## 2019-07-13 MED ORDER — OXYCODONE HCL 5 MG/5ML PO SOLN
5.0000 mg | Freq: Once | ORAL | Status: AC | PRN
  Filled 2019-07-13: qty 5

## 2019-07-13 MED ORDER — ARTIFICIAL TEARS OPHTHALMIC OINT
TOPICAL_OINTMENT | OPHTHALMIC | Status: AC
  Filled 2019-07-13: qty 3.5

## 2019-07-13 MED ORDER — SODIUM CHLORIDE 0.9 % IR SOLN
Status: DC | PRN
  Administered 2019-07-13: 1000 mL

## 2019-07-13 SURGICAL SUPPLY — 45 items
ADH SKN CLS APL DERMABOND .7 (GAUZE/BANDAGES/DRESSINGS) ×2
APL SWBSTK 6 STRL LF DISP (MISCELLANEOUS)
APPLICATOR COTTON TIP 6 STRL (MISCELLANEOUS) IMPLANT
APPLICATOR COTTON TIP 6IN STRL (MISCELLANEOUS) IMPLANT
BAG RETRIEVAL 10MM (BASKET) ×1
CANISTER SUCT 1200ML W/VALVE (MISCELLANEOUS) IMPLANT
CANISTER SUCT 3000ML PPV (MISCELLANEOUS) IMPLANT
CATH ROBINSON RED A/P 16FR (CATHETERS) ×4 IMPLANT
COVER MAYO STAND STRL (DRAPES) ×4 IMPLANT
COVER WAND RF STERILE (DRAPES) ×4 IMPLANT
DERMABOND ADVANCED (GAUZE/BANDAGES/DRESSINGS) ×2
DERMABOND ADVANCED .7 DNX12 (GAUZE/BANDAGES/DRESSINGS) ×2 IMPLANT
DRSG COVADERM PLUS 2X2 (GAUZE/BANDAGES/DRESSINGS) ×6 IMPLANT
DURAPREP 26ML APPLICATOR (WOUND CARE) ×4 IMPLANT
GAUZE 4X4 16PLY RFD (DISPOSABLE) ×4 IMPLANT
GLOVE BIO SURGEON STRL SZ7 (GLOVE) ×8 IMPLANT
GOWN STRL REUS W/ TWL XL LVL3 (GOWN DISPOSABLE) ×2 IMPLANT
GOWN STRL REUS W/TWL XL LVL3 (GOWN DISPOSABLE) ×8 IMPLANT
KIT TURNOVER CYSTO (KITS) ×4 IMPLANT
NEEDLE INSUFFLATION 120MM (ENDOMECHANICALS) IMPLANT
NS IRRIG 500ML POUR BTL (IV SOLUTION) ×4 IMPLANT
PACK LAPAROSCOPY BASIN (CUSTOM PROCEDURE TRAY) ×4 IMPLANT
PAD OB MATERNITY 4.3X12.25 (PERSONAL CARE ITEMS) ×4 IMPLANT
PAD PREP 24X48 CUFFED NSTRL (MISCELLANEOUS) ×4 IMPLANT
SCISSORS LAP 5X45 EPIX DISP (ENDOMECHANICALS) IMPLANT
SEALER TISSUE G2 CVD JAW 45CM (ENDOMECHANICALS) ×2 IMPLANT
SET IRRIG TUBING LAPAROSCOPIC (IRRIGATION / IRRIGATOR) IMPLANT
SET IRRIG Y TYPE TUR BLADDER L (SET/KITS/TRAYS/PACK) ×4 IMPLANT
SET SUCTION IRRIG HYDROSURG (IRRIGATION / IRRIGATOR) ×2 IMPLANT
SET TUBE SMOKE EVAC HIGH FLOW (TUBING) ×4 IMPLANT
SUT VIC AB 3-0 PS2 18 (SUTURE) ×4
SUT VIC AB 3-0 PS2 18XBRD (SUTURE) ×2 IMPLANT
SUT VICRYL 0 ENDOLOOP (SUTURE) IMPLANT
SUT VICRYL 0 UR6 27IN ABS (SUTURE) IMPLANT
SUT VICRYL 4-0 PS2 18IN ABS (SUTURE) IMPLANT
SYR 50ML LL SCALE MARK (SYRINGE) ×2 IMPLANT
SYS BAG RETRIEVAL 10MM (BASKET) ×3
SYSTEM BAG RETRIEVAL 10MM (BASKET) IMPLANT
TOWEL OR 17X26 10 PK STRL BLUE (TOWEL DISPOSABLE) ×8 IMPLANT
TROCAR BALLN 12MMX100 BLUNT (TROCAR) IMPLANT
TROCAR OPTI TIP 5M 100M (ENDOMECHANICALS) ×6 IMPLANT
TROCAR XCEL DIL TIP R 11M (ENDOMECHANICALS) IMPLANT
WARMER LAPAROSCOPE (MISCELLANEOUS) ×4 IMPLANT
WATER STERILE IRR 3000ML UROMA (IV SOLUTION) ×4 IMPLANT
WATER STERILE IRR 500ML POUR (IV SOLUTION) ×4 IMPLANT

## 2019-07-13 NOTE — Anesthesia Postprocedure Evaluation (Signed)
Anesthesia Post Note  Patient: [name]  Procedure(s) Performed: LAPAROSCOPIC RIGHT SALPINGO OOPHORECTOMY (N/A Abdomen) CYSTOSCOPY (Bilateral Bladder)     Patient location during evaluation: PACU Anesthesia Type: General Level of consciousness: awake and alert Pain management: pain level controlled Vital Signs Assessment: post-procedure vital signs reviewed and stable Respiratory status: spontaneous breathing, nonlabored ventilation and respiratory function stable Cardiovascular status: blood pressure returned to baseline and stable Postop Assessment: no apparent nausea or vomiting Anesthetic complications: no    Last Vitals:  Vitals:   07/13/19 1200 07/13/19 1250  BP: 98/67 101/63  Pulse: 66 73  Resp: 15 12  Temp:  36.8 C  SpO2: 100% 99%    Last Pain:  Vitals:   07/13/19 1228  TempSrc:   PainSc: 6                  Catalina Gravel

## 2019-07-13 NOTE — Anesthesia Procedure Notes (Signed)
Procedure Name: Intubation Date/Time: 07/13/2019 9:25 AM Performed by: Wanita Chamberlain, CRNA Pre-anesthesia Checklist: Patient identified, Emergency Drugs available, Patient being monitored, Suction available and Timeout performed Patient Re-evaluated:Patient Re-evaluated prior to induction Oxygen Delivery Method: Circle system utilized Preoxygenation: Pre-oxygenation with 100% oxygen Induction Type: IV induction Ventilation: Mask ventilation without difficulty Laryngoscope Size: Mac and 3 Grade View: Grade I Tube type: Oral Tube size: 7.0 mm Number of attempts: 1 Airway Equipment and Method: Stylet Placement Confirmation: ETT inserted through vocal cords under direct vision,  positive ETCO2,  CO2 detector and breath sounds checked- equal and bilateral Secured at: 22 cm Tube secured with: Tape Dental Injury: Teeth and Oropharynx as per pre-operative assessment

## 2019-07-13 NOTE — Brief Op Note (Signed)
07/13/2019  10:54 AM  PATIENT:  [name]  34 y.o. female  PRE-OPERATIVE DIAGNOSIS:  RIGHT OVARIAN CYST, PELVIC PAIN  POST-OPERATIVE DIAGNOSIS:  RIGHT OVARIAN CYST, PELVIC PAIN  PROCEDURE:  Procedure(s): LAPAROSCOPIC RIGHT SALPINGO OOPHORECTOMY (N/A) CYSTOSCOPY (Bilateral)  SURGEON:  Surgeon(s) and Role:    * Arvella Nigh, MD - Primary  PHYSICIAN ASSISTANT:   ASSISTANTS: none   ANESTHESIA:   local and general  EBL:  5 mL  2 BLOOD ADMINISTERED:none  DRAINS: none   LOCAL MEDICATIONS USED:  XYLOCAINE   SPECIMEN:  Source of Specimen:  right ovar7  DISPOSITION OF SPECIMEN:  PATHOLOGY  COUNTS:  YES  TOURNIQUET:  * No tourniquets in log *  DICTATION: .Other Dictation: Dictation Number [id number]  PLAN OF CARE: Discharge to home after PACU  PATIENT DISPOSITION:  PACU - hemodynamically stable.   Delay start of Pharmacological VTE agent (>24hrs) due to surgical blood loss or risk of bleeding: not applicable

## 2019-07-13 NOTE — Op Note (Signed)
NAME: [name], [name] MEDICAL RECORD [id number] ACCOUNT [id number] DATE OF BIRTH:1985-11-02 FACILITY: [facility] LOCATION: [facility] PHYSICIAN:Woodward Klem Sherran Needs, MD  OPERATIVE REPORT  DATE OF PROCEDURE:  07/13/2019  PREOPERATIVE DIAGNOSIS:  Cystic enlargement of the right ovary.  POSTOPERATIVE DIAGNOSIS:  Cystic enlargement of the right ovary.  OPERATIVE PROCEDURE:  Open laparoscopy with right salpingo-oophorectomy and cystoscopy.  SURGEON:  Darlyn Chamber, MD  ANESTHESIA:  General endotracheal.  ESTIMATED BLOOD LOSS:  Minimal.  PACKS AND DRAINS:  None.  INTRAOPERATIVE BLOOD PLACED:  None.  COMPLICATIONS:  None.  INDICATIONS:  Dictated in history and physical.  DESCRIPTION OF PROCEDURE:  The patient was taken to the OR and placed in supine position.  After satisfactory level of general endotracheal anesthesia was obtained, she was placed in the dorsal lithotomy position using the Allen stirrups.  Perineum and  vagina were prepped out with Betadine.  Bladder was entered without catheterization.  Abdomen was prepped with DuraPrep.  She was subsequently draped as sterile field.  Subumbilical incision made with knife and extended through subcutaneous tissue.   Fascia was identified and sharp incision the fascia laterally.  Peritoneum was entered with blunt finger pressure.  The open laparoscopic trocar was put in place and secured.  The abdomen was inflated with carbon dioxide.  Laparoscope was introduced.   Visualization revealed no evidence of injury to adjacent organs.  A 5 mm trocar was put in place in suprapubic area under direct visualization.  Visualization revealed normal liver.  Appendix was normal.  Gallbladder was normal.  Both lateral gutters  were clear.  Uterus was surgically absent.  Left ovary was normal.  Right ovary was cystically enlarged, although not torsed.  Ureter was easily identified along the right pelvic sidewall.  The ovary was elevated.  Using the EnSeal the  ovarian  vasculature was cauterized and incised.  Next, a 2nd 5 mm trocar was placed in the left lower quadrant under direct visualization.  An Endobag was brought in through the subumbilical incision.  A 5 mm laparoscope was introduced in the left lower quadrant  visualization.  We were able to insert the ovary and cyst into the Endobag and was removed through the subumbilical port.  Laparoscope was reintroduced.  Visualization revealed good hemostasis.  We thoroughly irrigated the pelvis had excellent  hemostasis.  Irrigation was then removed.  Laparoscope and trocars were removed.  Subumbilical fascia was closed with 2 figure-of-eights of 0 Vicryl.  Skin was closed with interrupted subcuticulars of 4-0 Vicryl.  Lower incisions were closed with Dermabond.  Cystoscope was then performed.  The patient was given fluorouracil.  Visualization eventually revealed spillage of urine from both ureteral orifices.  Bladder was intact.  Cystoscope was then removed.  The patient was then taken out of dorsal lithotomy  position.  Once alert and extubated, was transferred to recovery room in good condition.  Sponge, instrument and needle count reported as correct by circulating nurse x2.  CN/NUANCE  D:07/13/2019 T:07/13/2019 JOB:[id number]

## 2019-07-13 NOTE — H&P (Signed)
  History and physical exam unchanged 

## 2019-07-13 NOTE — Transfer of Care (Signed)
Immediate Anesthesia Transfer of Care Note  Patient: [name]  Procedure(s) Performed: LAPAROSCOPIC RIGHT SALPINGO OOPHORECTOMY (N/A Abdomen) CYSTOSCOPY (Bilateral Bladder)  Patient Location: PACU  Anesthesia Type:General  Level of Consciousness: sedated and patient cooperative  Airway & Oxygen Therapy: Patient Spontanous Breathing and Patient connected to nasal cannula oxygen  Post-op Assessment: Report given to RN and Post -op Vital signs reviewed and stable  Post vital signs: Reviewed and stable  Last Vitals:  Vitals Value Taken Time  BP    Temp    Pulse 63 07/13/19 1052  Resp 15 07/13/19 1052  SpO2 100 % 07/13/19 1052  Vitals shown include unvalidated device data.  Last Pain:  Vitals:   07/13/19 0745  TempSrc: Oral  PainSc: 9       Patients Stated Pain Goal: 5 ([id number])  Complications: No apparent anesthesia complications

## 2019-07-13 NOTE — Anesthesia Preprocedure Evaluation (Addendum)
Anesthesia Evaluation  Patient identified by MRN, date of birth, ID band Patient awake    Reviewed: Allergy & Precautions, H&P , NPO status , Patient's Chart, lab work & pertinent test results  Airway Mallampati: II   Neck ROM: full    Dental  (+) Partial Upper, Dental Advisory Given, Poor Dentition, Missing, Chipped   Pulmonary asthma , former smoker,    breath sounds clear to auscultation       Cardiovascular Exercise Tolerance: Good negative cardio ROS   Rhythm:Regular Rate:Normal     Neuro/Psych PSYCHIATRIC DISORDERS Anxiety Depression    GI/Hepatic negative GI ROS, Neg liver ROS,   Endo/Other  negative endocrine ROS  Renal/GU stones  negative genitourinary   Musculoskeletal negative musculoskeletal ROS (+)   Abdominal   Peds  Hematology negative hematology ROS (+)   Anesthesia Other Findings Pt states she has been in recovery/"clean" Since Aug 2019  Reproductive/Obstetrics negative OB ROS Pelvic pain/ Right ovarian cyst                       Anesthesia Physical Anesthesia Plan  ASA: II  Anesthesia Plan: General   Post-op Pain Management:    Induction: Intravenous  PONV Risk Score and Plan: 3 and Ondansetron, Dexamethasone, Midazolam, Treatment may vary due to age or medical condition, Scopolamine patch - Pre-op and Diphenhydramine  Airway Management Planned: Oral ETT  Additional Equipment:   Intra-op Plan:   Post-operative Plan: Extubation in OR  Informed Consent: I have reviewed the patients History and Physical, chart, labs and discussed the procedure including the risks, benefits and alternatives for the proposed anesthesia with the patient or authorized representative who has indicated his/her understanding and acceptance.     Dental advisory given  Plan Discussed with: Anesthesiologist and CRNA  Anesthesia Plan Comments:        Anesthesia Quick Evaluation

## 2019-07-13 NOTE — Discharge Instructions (Signed)
Post Anesthesia Home Care Instructions  Activity: Get plenty of rest for the remainder of the day. A responsible adult should stay with you for 24 hours following the procedure.  For the next 24 hours, DO NOT: -Drive a car -Operate machinery -Drink alcoholic beverages -Take any medication unless instructed by your physician -Make any legal decisions or sign important papers.  Meals: Start with liquid foods such as gelatin or soup. Progress to regular foods as tolerated. Avoid greasy, spicy, heavy foods. If nausea and/or vomiting occur, drink only clear liquids until the nausea and/or vomiting subsides. Call your physician if vomiting continues.  Special Instructions/Symptoms: Your throat may feel dry or sore from the anesthesia or the breathing tube placed in your throat during surgery. If this causes discomfort, gargle with warm salt water. The discomfort should disappear within 24 hours.  If you had a scopolamine patch placed behind your ear for the management of post- operative nausea and/or vomiting:  1. The medication in the patch is effective for 72 hours, after which it should be removed.  Wrap patch in a tissue and discard in the trash. Wash hands thoroughly with soap and water. 2. You may remove the patch earlier than 72 hours if you experience unpleasant side effects which may include dry mouth, dizziness or visual disturbances. 3. Avoid touching the patch. Wash your hands with soap and water after contact with the patch.   DISCHARGE INSTRUCTIONS: Laparoscopy  The following instructions have been prepared to help you care for yourself upon your return home today.  Wound care: . Do not get the incision wet for the first 24 hours. The incision should be kept clean and dry. . The Band-Aids or dressings may be removed the day after surgery. . Should the incision become sore, red, and swollen after the first week, check with your doctor.  Personal hygiene: . Shower the day  after your procedure.  Activity and limitations: . Do NOT drive or operate any equipment today. . Do NOT lift anything more than 15 pounds for 2-3 weeks after surgery. . Do NOT rest in bed all day. . Walking is encouraged. Walk each day, starting slowly with 5-minute walks 3 or 4 times a day. Slowly increase the length of your walks. . Walk up and down stairs slowly. . Do NOT do strenuous activities, such as golfing, playing tennis, bowling, running, biking, weight lifting, gardening, mowing, or vacuuming for 2-4 weeks. Ask your doctor when it is okay to start.  Diet: Eat a light meal as desired this evening. You may resume your usual diet tomorrow.  Return to work: This is dependent on the type of work you do. For the most part you can return to a desk job within a week of surgery. If you are more active at work, please discuss this with your doctor.  What to expect after your surgery: You may have a slight burning sensation when you urinate on the first day. You may have a very small amount of blood in the urine. Expect to have a small amount of vaginal discharge/light bleeding for 1-2 weeks. It is not unusual to have abdominal soreness and bruising for up to 2 weeks. You may be tired and need more rest for about 1 week. You may experience shoulder pain for 24-72 hours. Lying flat in bed may relieve it.  Call your doctor for any of the following: . Develop a fever of 100.4 or greater . Inability to urinate 6 hours after discharge   from hospital . Severe pain not relieved by pain medications . Persistent of heavy bleeding at incision site . Redness or swelling around incision site after a week . Increasing nausea or vomiting  Patient Signature________________________________________ Nurse Signature_________________________________________ 

## 2019-07-17 LAB — SURGICAL PATHOLOGY

## 2020-02-22 ENCOUNTER — Telehealth: Payer: Self-pay | Admitting: Nurse Practitioner

## 2020-02-22 DIAGNOSIS — U071 COVID-19: Secondary | ICD-10-CM

## 2020-02-22 NOTE — Telephone Encounter (Signed)
Called to Discuss with patient about Covid symptoms and the use of a monoclonal antibody infusion for those with mild to moderate Covid symptoms and at a high risk of hospitalization.     Pt is qualified for this infusion at the [facility] due to co-morbid conditions and/or a member of an at-risk group.     Unable to reach pt. Left message to return call.   Ramirez Fullbright, DNP, AGNP-C [phone] (Infusion Center Hotline)  

## 2020-07-11 ENCOUNTER — Encounter: Payer: Self-pay | Admitting: Nurse Practitioner

## 2020-07-12 ENCOUNTER — Other Ambulatory Visit: Payer: Self-pay

## 2020-07-12 ENCOUNTER — Emergency Department (HOSPITAL_BASED_OUTPATIENT_CLINIC_OR_DEPARTMENT_OTHER)
Admission: EM | Admit: 2020-07-12 | Discharge: 2020-07-12 | Disposition: A | Discharge status: Home / Self Care | Payer: BC Managed Care – PPO | Attending: Emergency Medicine | Admitting: Emergency Medicine

## 2020-07-12 ENCOUNTER — Encounter (HOSPITAL_BASED_OUTPATIENT_CLINIC_OR_DEPARTMENT_OTHER): Payer: Self-pay | Admitting: Emergency Medicine

## 2020-07-12 DIAGNOSIS — J069 Acute upper respiratory infection, unspecified: Secondary | ICD-10-CM | POA: Diagnosis not present

## 2020-07-12 DIAGNOSIS — Z20822 Contact with and (suspected) exposure to covid-19: Secondary | ICD-10-CM | POA: Diagnosis not present

## 2020-07-12 DIAGNOSIS — J45909 Unspecified asthma, uncomplicated: Secondary | ICD-10-CM | POA: Insufficient documentation

## 2020-07-12 DIAGNOSIS — R1110 Vomiting, unspecified: Secondary | ICD-10-CM | POA: Diagnosis not present

## 2020-07-12 DIAGNOSIS — Z87891 Personal history of nicotine dependence: Secondary | ICD-10-CM | POA: Diagnosis not present

## 2020-07-12 DIAGNOSIS — R059 Cough, unspecified: Secondary | ICD-10-CM | POA: Diagnosis present

## 2020-07-12 LAB — SARS CORONAVIRUS 2 (TAT 6-24 HRS): SARS Coronavirus 2: NEGATIVE

## 2020-07-12 MED ORDER — IBUPROFEN 400 MG PO TABS
600.0000 mg | ORAL_TABLET | Freq: Once | ORAL | Status: AC
  Administered 2020-07-12: 600 mg via ORAL
  Filled 2020-07-12: qty 1

## 2020-07-12 MED ORDER — PSEUDOEPHEDRINE HCL ER 120 MG PO TB12: 120.0000 mg | ORAL_TABLET | Freq: Two times a day (BID) | ORAL | 0 refills | Status: DC

## 2020-07-12 MED ORDER — PSEUDOEPHEDRINE HCL ER 120 MG PO TB12: 120.0000 mg | ORAL_TABLET | Freq: Once | ORAL | Status: DC

## 2020-07-12 MED ORDER — PSEUDOEPHEDRINE HCL 30 MG PO TABS
60.0000 mg | ORAL_TABLET | Freq: Once | ORAL | Status: AC
  Administered 2020-07-12: 60 mg via ORAL
  Filled 2020-07-12: qty 2

## 2020-07-12 NOTE — ED Provider Notes (Signed)
[name] Provider Note   CSN: [id number] Arrival date & time: 07/12/20  6269     History Chief Complaint  Patient presents with  . Facial Pain    [name] is a 35 y.o. female.  Patient presents chief complaint cough nasal congestion nasal and sinus pain.  Symptoms been ongoing for 3 days.  She had a fever 101 yesterday.  She is been taking Tylenol around-the-clock for last few days as well as DayQuil and NyQuil.  Denies chest pain or shortness of breath denies diarrhea.  She has had a few episodes of posttussive vomiting as well.         Past Medical History:  Diagnosis Date  . Anxiety   . Asthma    childhood  . Complication of anesthesia    with hystertomy had to go up on anesthsia due to woke up  . Depression   . Endometriosis   . History of asthma    with bronchitis  . History of genital warts   . History of kidney stones    passed stones, no surgery required  . History of pelvic inflammatory disease   . Mood disorder (HCC)    UNSPECIFIED  . Pelvic pain in female   . SVD (spontaneous vaginal delivery)     Patient Active Problem List   Diagnosis Date Noted  . Sprain of ankle 02/21/2015  . S/P hysterectomy 06/25/2014  . Pelvic pain in female 08/18/2013    Past Surgical History:  Procedure Laterality Date  . ABDOMINAL HYSTERECTOMY     partial  . ABLATION COLPOCLESIS    . BILATERAL SALPINGECTOMY Bilateral 06/25/2014   Procedure: BILATERAL SALPINGECTOMY;  Surgeon: Cheri Fowler, MD;  Location: [facility];  Service: Gynecology;  Laterality: Bilateral;  . CYSTO WITH HYDRODISTENSION N/A 12/01/2013   Procedure: CYSTOSCOPY/HYDRODISTENSION with instillation of marcaine and pyridium;  Surgeon: Ardis Hughs, MD;  Location: [facility];  Service: Urology;  Laterality: N/A;  . CYSTOSCOPY N/A 06/25/2014   Procedure: CYSTOSCOPY;  Surgeon: Cheri Fowler, MD;  Location: [facility];  Service: Gynecology;  Laterality:  N/A;  . CYSTOSCOPY Bilateral 07/13/2019   Procedure: CYSTOSCOPY;  Surgeon: Arvella Nigh, MD;  Location: [facility];  Service: Gynecology;  Laterality: Bilateral;  . DX LAPAROSCOPY/ ASPIRATION RIGHT OVARIAN CYST AND BX POSTERIOR CUL-DE-SAC  05-27-2005  . ESSURE TUBAL LIGATION Bilateral 2011  . LAPAROSCOPIC ASSISTED VAGINAL HYSTERECTOMY N/A 06/25/2014   Procedure: LAPAROSCOPIC ASSISTED VAGINAL HYSTERECTOMY;  Surgeon: Cheri Fowler, MD;  Location: [facility];  Service: Gynecology;  Laterality: N/A;  . LAPAROSCOPIC SALPINGO OOPHERECTOMY N/A 07/13/2019   Procedure: LAPAROSCOPIC RIGHT SALPINGO OOPHORECTOMY;  Surgeon: Arvella Nigh, MD;  Location: [facility];  Service: Gynecology;  Laterality: N/A;  . LAPAROSCOPY N/A 08/18/2013   Procedure: LAPAROSCOPY DIAGNOSTIC, FULGERATION OF ENDOMETROSIS;  Surgeon: Cheri Fowler, MD;  Location: [facility];  Service: Gynecology;  Laterality: N/A;  . WISDOM TOOTH EXTRACTION       OB History    Gravida  2   Para  2   Term  2   Preterm      AB      Living  2     SAB      IAB      Ectopic      Multiple      Live Births              No family history on file.  Social History  Tobacco Use  . Smoking status: Former Smoker    Packs/day: 0.50    Years: 4.00    Pack years: 2.00    Types: Cigarettes  . Smokeless tobacco: Never Used  . Tobacco comment: quit oct 2020  Vaping Use  . Vaping Use: Never used  Substance Use Topics  . Alcohol use: No    Alcohol/week: 0.0 standard drinks  . Drug use: Not Currently    Types: Heroin    Comment: last used aug 01-2018    Home Medications Prior to Admission medications   Medication Sig Start Date End Date Taking? Authorizing Provider  pseudoephedrine (SUDAFED 12 HOUR) 120 MG 12 hr tablet Take 1 tablet (120 mg total) by mouth 2 (two) times daily. 07/12/20  Yes Luna Fuse, MD  HYDROcodone-acetaminophen (NORCO/VICODIN) 5-325 MG tablet Take 1-2 tablets by mouth every 4 (four)  hours as needed. 08/21/16   Orpah Greek, MD  ibuprofen (ADVIL,MOTRIN) 600 MG tablet Take 1 tablet (600 mg total) by mouth 4 (four) times daily. 09/21/16   Lily Kocher, PA-C  naproxen (NAPROSYN) 500 MG tablet Take 500 mg by mouth 3 (three) times daily with meals.    [provider]    Allergies    Biaxin [clarithromycin], Penicillins, and Toradol [ketorolac tromethamine]  Review of Systems   Review of Systems  Constitutional: Positive for fever.  HENT: Negative for ear pain.   Eyes: Negative for pain.  Respiratory: Positive for cough.   Cardiovascular: Negative for chest pain.  Gastrointestinal: Negative for abdominal pain.  Genitourinary: Negative for flank pain.  Musculoskeletal: Negative for back pain.  Skin: Negative for rash.  Neurological: Negative for headaches.    Physical Exam Updated Vital Signs BP 107/63 (BP Location: Right Arm)   Pulse 76   Temp 97.8 F (36.6 C) (Oral)   Resp 16   Ht 5\' 3"  (1.6 m)   Wt 74.8 kg   LMP 06/15/2014   SpO2 100%   BMI 29.23 kg/m   Physical Exam Constitutional:      General: She is not in acute distress.    Appearance: Normal appearance.  HENT:     Head: Normocephalic.     Comments: Tenderness to bilateral maxillary sinuses on palpation.  Right greater than left.    Nose: Nose normal.  Eyes:     Extraocular Movements: Extraocular movements intact.  Cardiovascular:     Rate and Rhythm: Normal rate.  Pulmonary:     Effort: Pulmonary effort is normal.  Musculoskeletal:        General: Normal range of motion.     Cervical back: Normal range of motion.  Neurological:     General: No focal deficit present.     Mental Status: She is alert. Mental status is at baseline.     Cranial Nerves: No cranial nerve deficit.     Motor: No weakness.     Gait: Gait normal.     ED Results / Procedures / Treatments   Labs (all labs ordered are listed, but only abnormal results are displayed) Labs Reviewed  SARS  CORONAVIRUS 2 (TAT 6-24 HRS)    EKG None  Radiology No results found.  Procedures Procedures   Medications Ordered in ED Medications  ibuprofen (ADVIL) tablet 600 mg (600 mg Oral Given 07/12/20 0959)  pseudoephedrine (SUDAFED) tablet 60 mg (60 mg Oral Given 07/12/20 1004)    ED Course  I have reviewed the triage vital signs and the nursing notes.  Pertinent labs &  imaging results that were available during my care of the patient were reviewed by me and considered in my medical decision making (see chart for details).    MDM Rules/Calculators/A&P                          I advised the patient to continue to use symptomatic management at home.  Advised addition of Sudafed and ibuprofen to her Tylenol and DayQuil NyQuil.  I advised immediate return if she has worsening symptoms persistent fevers or any additional concerns.  We did discuss antibiotic use, which I recommended if she has symptoms greater than 10 days.   Final Clinical Impression(s) / ED Diagnoses Final diagnoses:  Upper respiratory tract infection, unspecified type    Rx / DC Orders ED Discharge Orders         Ordered    pseudoephedrine (SUDAFED 12 HOUR) 120 MG 12 hr tablet  2 times daily        07/12/20 1009           Southmayd, Greggory Brandy, MD 07/12/20 1009

## 2020-07-12 NOTE — Discharge Instructions (Addendum)
Call your primary care doctor or specialist as discussed in the next 2-3 days.    Continue to isolate until you have your COVID results back.  Return immediately back to the ER if:  Your symptoms worsen within the next 12-24 hours. You develop new symptoms such as new fevers, persistent vomiting, new pain, shortness of breath, or new weakness or numbness, or if you have any other concerns.

## 2020-07-12 NOTE — ED Triage Notes (Signed)
Reports sinus pressure and pain in ears for the last few days.  Taking dayquil and niquil with no relief.

## 2020-07-24 ENCOUNTER — Encounter: Payer: Self-pay | Admitting: Nurse Practitioner

## 2020-07-24 ENCOUNTER — Other Ambulatory Visit (INDEPENDENT_AMBULATORY_CARE_PROVIDER_SITE_OTHER): Payer: BLUE CROSS/BLUE SHIELD

## 2020-07-24 ENCOUNTER — Ambulatory Visit (INDEPENDENT_AMBULATORY_CARE_PROVIDER_SITE_OTHER): Payer: BLUE CROSS/BLUE SHIELD | Admitting: Nurse Practitioner

## 2020-07-24 ENCOUNTER — Other Ambulatory Visit: Payer: Self-pay

## 2020-07-24 VITALS — BP 112/62 | HR 87 | Ht 63.0 in | Wt 172.2 lb

## 2020-07-24 DIAGNOSIS — R1011 Right upper quadrant pain: Secondary | ICD-10-CM

## 2020-07-24 DIAGNOSIS — R112 Nausea with vomiting, unspecified: Secondary | ICD-10-CM

## 2020-07-24 DIAGNOSIS — K219 Gastro-esophageal reflux disease without esophagitis: Secondary | ICD-10-CM

## 2020-07-24 DIAGNOSIS — K625 Hemorrhage of anus and rectum: Secondary | ICD-10-CM | POA: Diagnosis not present

## 2020-07-24 LAB — COMPREHENSIVE METABOLIC PANEL
ALT: 9 U/L (ref 0–35)
AST: 10 U/L (ref 0–37)
Albumin: 4.3 g/dL (ref 3.5–5.2)
Alkaline Phosphatase: 36 U/L — ABNORMAL LOW (ref 39–117)
BUN: 12 mg/dL (ref 6–23)
CO2: 29 mEq/L (ref 19–32)
Calcium: 9.1 mg/dL (ref 8.4–10.5)
Chloride: 102 mEq/L (ref 96–112)
Creatinine, Ser: 0.71 mg/dL (ref 0.40–1.20)
GFR: 112.31 mL/min (ref >60.00)
Glucose, Bld: 80 mg/dL (ref 70–99)
Potassium: 4.4 mEq/L (ref 3.5–5.1)
Sodium: 138 mEq/L (ref 135–145)
Total Bilirubin: 0.4 mg/dL (ref 0.2–1.2)
Total Protein: 7.5 g/dL (ref 6.0–8.3)

## 2020-07-24 LAB — CBC
HCT: 42.5 % (ref 36.0–46.0)
Hemoglobin: 14.2 g/dL (ref 12.0–15.0)
MCHC: 33.5 g/dL (ref 30.0–36.0)
MCV: 100.6 fl — ABNORMAL HIGH (ref 78.0–100.0)
Platelets: 324 10*3/uL (ref 150.0–400.0)
RBC: 4.22 Mil/uL (ref 3.87–5.11)
RDW: 14.3 % (ref 11.5–15.5)
WBC: 5.9 10*3/uL (ref 4.0–10.5)

## 2020-07-24 LAB — LIPASE: Lipase: 14 U/L (ref 11.0–59.0)

## 2020-07-24 MED ORDER — PLENVU 140 G PO SOLR: 1.0000 | ORAL | 0 refills | Status: DC

## 2020-07-24 MED ORDER — ONDANSETRON HCL 4 MG PO TABS: 4.0000 mg | ORAL_TABLET | Freq: Three times a day (TID) | ORAL | 3 refills | Status: AC | PRN

## 2020-07-24 NOTE — Progress Notes (Signed)
ASSESSMENT AND PLAN    # 35 yo female with several month history of frequent nausea, vomiting, generalized right sided abdominal pain.  Suspect some of her symptoms are functional in nature.  However, she does take NSAIDs so need to exclude PUD.  Doubt biliary source.  She had a mildly prominent CBD.   [place] ( 0.7 to 0.9 mm) but liver chemistries at the time were normal  --Schedule for EGD for further evaluation of symptoms --Previously uninsured and couldn't afford Zofran. She has insurance now so will try Zofran 4 mg Q 8 hours prn. Cautioned about potential for worsening constipation.  --Avoid all NSAIDs for now.  --CBC, liver chemistries.   # Chronic constipation, managed nicely with daily Miralax  # Intermittent painless rectal bleeding. She may have internal hemorrhoids.  --Patient will be scheduled for a colonoscopy. The risks and benefits of colonoscopy with possible polypectomy / biopsies were discussed and the patient agrees to proceed.   # GERD with heartburn ( started ~ one year ago). Resolved after initiation of Omeprazole a few months ago  --Discussed anti-reflux measures. She will work on reducing coffee intake --Continue daily Omeprazole   # Hx of cecal diverticulitis in 2020.  --further evaluation at time of colonoscopy  # Anxiety  HISTORY OF PRESENT ILLNESS     Chief Complaint : Right upper abdominal pain, nausea, vomiting, rectal bleeding  [name] is a 35 y.o. female with a past medical history significant for diverticulitis, endometriosis s/p hysterectomy in 2016, anxiety   Patient referred by PCP for right upper abdominal pain. Office records not available at time of this visit but arrived afterwards.   Patient describes generalized right sided sided abdominal pain spanning from RUQ to RLQ. Pain is intermittent, not related to eating. Pain sometimes exacerbated by activity but this is not consistent. Pain does not improve with defecation. The nausea  / vomiting is also not meal related. It gets worse with stress / anxiety such as when she is at work.  Prior to 8 months ago she had no problems with nausea / vomiting.  She takes either [place] powders or Ibuprogen about twice a week. Despite several months of these GI symptoms her weight has remained stable    [name] has been followed for several months by [employer], [facility] above symptoms. CT scan in January 2022  ( [facility]) was unrevealing.  RUQ [place] remarkable only for mild prominence of CBD measuring 0.7 to 0.9 cm.  In September 2021 she was seen in [facility] ED for evaluation of abdominal pain and vomiting.  Labs including lipase, CMP, and CBC were unrevealing.   In addition to above [name] has been experiencing painless rectal bleeding with BM.  She has had several episodes over the last couple of months. Bleeding is in absence in straining / constipation. She takes daily Mirialax and stools are soft.    Data Reviewed:  April 2020 CT scan abd / pelvis with contrast for RLQ pain  --fatty liver --inflammation about the cecum separate from normal appearing appendix cecal diverticulum  Sept 2021 [facility] ED Lipase 16 WBC 6.3 Hgb 13.7, MCV 102.4 Liver chemistries normal    Jan 2022 CT scan w/ contrast for abdominal pain --mild steatosis, unremarkable gallbladder, pancreas unremarkable. A 1.8 cm left hemorrhagic ovarian cyst. Contracted appearance of left hemicolon. RUQ pain. Gallbladder looked okay, no gallstones.. CBD was 0.7 to 0.9 No intrahepatic duct dilation.   Jan 2022 RUQ [place] --RUQ pain. Gallbladder  looked okay, no gallstones.. CBD was 0.7 to 0.9 No intrahepatic duct dilation.     PREVIOUS EVALUATIONS:    Past Medical History:  Diagnosis Date  . Anxiety   . Asthma    childhood  . Complication of anesthesia    with hystertomy had to go up on anesthsia due to woke up  . Depression   . Endometriosis   . History of asthma    with bronchitis  . History of genital warts   . History of  kidney stones    passed stones, no surgery required  . History of pelvic inflammatory disease   . Mood disorder (HCC)    UNSPECIFIED  . SVD (spontaneous vaginal delivery)      Past Surgical History:  Procedure Laterality Date  . ABDOMINAL HYSTERECTOMY     partial  . ABLATION COLPOCLESIS    . BILATERAL SALPINGECTOMY Bilateral 06/25/2014   Procedure: BILATERAL SALPINGECTOMY;  Surgeon: Cheri Fowler, MD;  Location: [facility];  Service: Gynecology;  Laterality: Bilateral;  . CYSTO WITH HYDRODISTENSION N/A 12/01/2013   Procedure: CYSTOSCOPY/HYDRODISTENSION with instillation of marcaine and pyridium;  Surgeon: Ardis Hughs, MD;  Location: [facility];  Service: Urology;  Laterality: N/A;  . CYSTOSCOPY N/A 06/25/2014   Procedure: CYSTOSCOPY;  Surgeon: Cheri Fowler, MD;  Location: [facility];  Service: Gynecology;  Laterality: N/A;  . CYSTOSCOPY Bilateral 07/13/2019   Procedure: CYSTOSCOPY;  Surgeon: Arvella Nigh, MD;  Location: [facility];  Service: Gynecology;  Laterality: Bilateral;  . DX LAPAROSCOPY/ ASPIRATION RIGHT OVARIAN CYST AND BX POSTERIOR CUL-DE-SAC  05-27-2005  . ESSURE TUBAL LIGATION Bilateral 2011  . LAPAROSCOPIC ASSISTED VAGINAL HYSTERECTOMY N/A 06/25/2014   Procedure: LAPAROSCOPIC ASSISTED VAGINAL HYSTERECTOMY;  Surgeon: Cheri Fowler, MD;  Location: [facility];  Service: Gynecology;  Laterality: N/A;  . LAPAROSCOPIC SALPINGO OOPHERECTOMY N/A 07/13/2019   Procedure: LAPAROSCOPIC RIGHT SALPINGO OOPHORECTOMY;  Surgeon: Arvella Nigh, MD;  Location: [facility];  Service: Gynecology;  Laterality: N/A;  . LAPAROSCOPY N/A 08/18/2013   Procedure: LAPAROSCOPY DIAGNOSTIC, FULGERATION OF ENDOMETROSIS;  Surgeon: Cheri Fowler, MD;  Location: [facility];  Service: Gynecology;  Laterality: N/A;  . WISDOM TOOTH EXTRACTION     No family history on file. Social History   Tobacco Use  . Smoking status: Former Smoker    Packs/day: 0.50    Years: 4.00     Pack years: 2.00    Types: Cigarettes  . Smokeless tobacco: Never Used  . Tobacco comment: quit oct 2020  Vaping Use  . Vaping Use: Never used  Substance Use Topics  . Alcohol use: No    Alcohol/week: 0.0 standard drinks  . Drug use: Not Currently    Types: Heroin    Comment: last used aug 01-2018   Current Outpatient Medications  Medication Sig Dispense Refill  . HYDROcodone-acetaminophen (NORCO/VICODIN) 5-325 MG tablet Take 1-2 tablets by mouth every 4 (four) hours as needed. 6 tablet 0  . ibuprofen (ADVIL,MOTRIN) 600 MG tablet Take 1 tablet (600 mg total) by mouth 4 (four) times daily. 30 tablet 0  . naproxen (NAPROSYN) 500 MG tablet Take 500 mg by mouth 3 (three) times daily with meals.    . pseudoephedrine (SUDAFED 12 HOUR) 120 MG 12 hr tablet Take 1 tablet (120 mg total) by mouth 2 (two) times daily. 20 tablet 0   No current facility-administered medications for this visit.   Allergies  Allergen Reactions  . Biaxin [Clarithromycin] Rash  . Penicillins Hives and Rash  Has patient had a PCN reaction causing immediate rash, facial/tongue/throat swelling, SOB or lightheadedness with hypotension: Yes Has patient had a PCN reaction causing severe rash involving mucus membranes or skin necrosis: Yes Has patient had a PCN reaction that required hospitalization No Has patient had a PCN reaction occurring within the last 10 years: No If all of the above answers are "NO", then may proceed with Cephalosporin use.   . Toradol [Ketorolac Tromethamine] Rash    Tolerates ibuprofen    Review of Systems: Positive for fatigue, muscle pain, cramps, night sweats.  All other systems reviewed and negative except where noted in HPI.   PHYSICAL EXAM :    Wt Readings from Last 3 Encounters:  07/12/20 165 lb (74.8 kg)  07/13/19 163 lb 3.2 oz (74 kg)  05/18/17 178 lb 9.2 oz (81 kg)     LMP 06/15/2014 BP 112/62, heart rate 87, SPO2 97%, weight 172 pounds, height 5 foot 3, BMI  30.5 Constitutional:  Pleasant female in no acute distress. Psychiatric: Normal mood and affect. Behavior is normal. EENT: Pupils normal.  Conjunctivae are normal. No scleral icterus. Neck supple.  Cardiovascular: Normal rate, regular rhythm. No edema Pulmonary/chest: Effort normal and breath sounds normal. No wheezing, rales or rhonchi. Abdominal: Soft, nondistended, nontender. Bowel sounds active throughout. There are no masses palpable. No hepatomegaly. Neurological: Alert and oriented to person place and time. Skin: Skin is warm and dry. No rashes noted.  Tye Savoy, NP  07/24/2020, 8:37 AM  Cc:  Referring Provider Shanon Rosser, PA

## 2020-07-24 NOTE — Patient Instructions (Addendum)
If you are age 33 or older, your body mass index should be between 23-30. Your Body mass index is 30.5 kg/m. If this is out of the aforementioned range listed, please consider follow up with your Primary Care Provider.  If you are age 5 or younger, your body mass index should be between 19-25. Your Body mass index is 30.5 kg/m. If this is out of the aformentioned range listed, please consider follow up with your Primary Care Provider.   You have been scheduled for an endoscopy and colonoscopy. Please follow the written instructions given to you at your visit today. Please pick up your prep supplies at the pharmacy within the next 1-3 days. If you use inhalers (even only as needed), please bring them with you on the day of your procedure.  Your provider has requested that you go to the basement level for lab work before leaving today. Press "B" on the elevator. The lab is located at the first door on the left as you exit the elevator.  We have sent Ondansetron 4 mg to your pharmacy.  Stop using NSAIDs, ie. Ibuprofen, Aleve, Advil  Follow up pending the results of your Colonoscopy/Endoscopy or as needed.  Thank you for entrusting me with your care and choosing [facility].  Tye Savoy, NP-C

## 2020-07-24 NOTE — Progress Notes (Signed)
Assessment and plan as noted 

## 2020-07-25 ENCOUNTER — Other Ambulatory Visit: Payer: Self-pay

## 2020-07-25 DIAGNOSIS — R112 Nausea with vomiting, unspecified: Secondary | ICD-10-CM

## 2020-07-25 DIAGNOSIS — D518 Other vitamin B12 deficiency anemias: Secondary | ICD-10-CM

## 2020-10-10 ENCOUNTER — Ambulatory Visit (AMBULATORY_SURGERY_CENTER): Payer: BLUE CROSS/BLUE SHIELD | Admitting: Internal Medicine

## 2020-10-10 ENCOUNTER — Other Ambulatory Visit: Payer: Self-pay

## 2020-10-10 ENCOUNTER — Encounter: Payer: Self-pay | Admitting: Internal Medicine

## 2020-10-10 VITALS — BP 121/60 | HR 57 | Temp 97.5°F | Resp 18 | Ht 63.0 in | Wt 172.0 lb

## 2020-10-10 DIAGNOSIS — K2970 Gastritis, unspecified, without bleeding: Secondary | ICD-10-CM | POA: Diagnosis not present

## 2020-10-10 DIAGNOSIS — K219 Gastro-esophageal reflux disease without esophagitis: Secondary | ICD-10-CM

## 2020-10-10 DIAGNOSIS — R194 Change in bowel habit: Secondary | ICD-10-CM

## 2020-10-10 DIAGNOSIS — R112 Nausea with vomiting, unspecified: Secondary | ICD-10-CM

## 2020-10-10 DIAGNOSIS — D122 Benign neoplasm of ascending colon: Secondary | ICD-10-CM

## 2020-10-10 DIAGNOSIS — K2990 Gastroduodenitis, unspecified, without bleeding: Secondary | ICD-10-CM

## 2020-10-10 DIAGNOSIS — K625 Hemorrhage of anus and rectum: Secondary | ICD-10-CM | POA: Diagnosis not present

## 2020-10-10 DIAGNOSIS — R1011 Right upper quadrant pain: Secondary | ICD-10-CM | POA: Diagnosis not present

## 2020-10-10 DIAGNOSIS — K2950 Unspecified chronic gastritis without bleeding: Secondary | ICD-10-CM

## 2020-10-10 DIAGNOSIS — K5901 Slow transit constipation: Secondary | ICD-10-CM

## 2020-10-10 MED ORDER — DICYCLOMINE HCL 10 MG PO CAPS: 10.0000 mg | ORAL_CAPSULE | ORAL | 0 refills | Status: AC | PRN

## 2020-10-10 MED ORDER — SODIUM CHLORIDE 0.9 % IV SOLN: 500.0000 mL | Freq: Once | INTRAVENOUS | Status: AC

## 2020-10-10 NOTE — Progress Notes (Signed)
A/ox3, pleased with MAC, report to RN 

## 2020-10-10 NOTE — Progress Notes (Signed)
Called to room to assist during endoscopic procedure.  Patient ID and intended procedure confirmed with present staff. Received instructions for my participation in the procedure from the performing physician.  

## 2020-10-10 NOTE — Op Note (Signed)
[place] Patient Name: [name] Procedure Date: 10/10/2020 3:58 PM MRN: [id number] Endoscopist: Docia Chuck. [name] , MD Age: 35 Referring MD:  Date of Birth: February 20, 1986 Gender: Female Account #: [id number] Procedure:                Colonoscopy with cold snare polypectomy x 1 Indications:              Abdominal pain in the right upper quadrant, Rectal                            bleeding, Change in bowel habits Medicines:                Monitored Anesthesia Care Procedure:                Pre-Anesthesia Assessment:                           - Prior to the procedure, a History and Physical                            was performed, and patient medications and                            allergies were reviewed. The patient's tolerance of                            previous anesthesia was also reviewed. The risks                            and benefits of the procedure and the sedation                            options and risks were discussed with the patient.                            All questions were answered, and informed consent                            was obtained. Prior Anticoagulants: The patient has                            taken no previous anticoagulant or antiplatelet                            agents. ASA Grade Assessment: II - A patient with                            mild systemic disease. After reviewing the risks                            and benefits, the patient was deemed in                            satisfactory condition to undergo the procedure.  After obtaining informed consent, the colonoscope                            was passed under direct vision. Throughout the                            procedure, the patient's blood pressure, pulse, and                            oxygen saturations were monitored continuously. The                            CF [id number] #[id number] was introduced through the anus                             and advanced to the the cecum, identified by                            appendiceal orifice and ileocecal valve. The                            terminal ileum, ileocecal valve, appendiceal                            orifice, and rectum were photographed. The quality                            of the bowel preparation was excellent. The                            colonoscopy was performed without difficulty. The                            patient tolerated the procedure well. The bowel                            preparation used was SUPREP via split dose                            instruction. Scope In: 4:16:26 PM Scope Out: 4:29:09 PM Scope Withdrawal Time: 0 hours 10 minutes 26 seconds  Total Procedure Duration: 0 hours 12 minutes 43 seconds  Findings:                 The terminal ileum appeared normal.                           A 5 mm polyp was found in the ascending colon. The                            polyp was removed with a cold snare. Resection and                            retrieval were complete.  The exam was otherwise without abnormality on                            direct and retroflexion views. Complications:            No immediate complications. Estimated blood loss:                            None. Estimated Blood Loss:     Estimated blood loss: none. Impression:               - The examined portion of the ileum was normal.                           - One 5 mm polyp in the ascending colon, removed                            with a cold snare. Resected and retrieved.                           - The examination was otherwise normal on direct                            and retroflexion views.                           - No cause for abdominal pain identified Recommendation:           - Repeat colonoscopy in 7 years for surveillance if                            polyp adenomatous. Otherwise, age 16.                           - Patient has a  contact number available for                            emergencies. The signs and symptoms of potential                            delayed complications were discussed with the                            patient. Return to normal activities tomorrow.                            Written discharge instructions were provided to the                            patient.                           - Resume previous diet.                           - Continue present medications.                           -  Await pathology results. Docia Chuck. Teresa Pastor, MD 10/10/2020 4:35:18 PM This report has been signed electronically.

## 2020-10-10 NOTE — Progress Notes (Signed)
Medical history reviewed with no changes noted. VS assessed by C.W 

## 2020-10-10 NOTE — Progress Notes (Signed)
[place] relieves [employer]

## 2020-10-10 NOTE — Op Note (Signed)
[facility] Patient Name: [name] Procedure Date: 10/10/2020 3:58 PM MRN: [id number] Endoscopist: Docia Chuck. Henrene Parker , MD Age: 35 Referring MD:  Date of Birth: [birth year] Gender: Female Account #: [id number] Procedure:                Upper GI endoscopy with biopsies Indications:              Abdominal pain in the right upper quadrant, Nausea                            with vomiting Medicines:                Monitored Anesthesia Care Procedure:                Pre-Anesthesia Assessment:                           - Prior to the procedure, a History and Physical                            was performed, and patient medications and                            allergies were reviewed. The patient's tolerance of                            previous anesthesia was also reviewed. The risks                            and benefits of the procedure and the sedation                            options and risks were discussed with the patient.                            All questions were answered, and informed consent                            was obtained. Prior Anticoagulants: The patient has                            taken no previous anticoagulant or antiplatelet                            agents. ASA Grade Assessment: II - A patient with                            mild systemic disease. After reviewing the risks                            and benefits, the patient was deemed in                            satisfactory condition to undergo the procedure.  After obtaining informed consent, the endoscope was                            passed under direct vision. Throughout the                            procedure, the patient's blood pressure, pulse, and                            oxygen saturations were monitored continuously. The                            Endoscope was introduced through the mouth, and                            advanced to the second part  of duodenum. The upper                            GI endoscopy was accomplished without difficulty.                            The patient tolerated the procedure well. Scope In: Scope Out: Findings:                 The esophagus was normal.                           The stomach was normal save prepyloric antral                            erythema. Biopsies were taken with a cold forceps                            for histology.                           The examined duodenum was normal.                           The cardia and gastric fundus were normal on                            retroflexion. Complications:            No immediate complications. Estimated Blood Loss:     Estimated blood loss: none. Impression:               - Normal esophagus.                           - Normal stomach save prepyloric erythema. Biopsied.                           - Normal examined duodenum.                           - No obvious abnormality to cause pain identified                           -  May be experiencing intermittent intestinal                            spasm. If not, pain may be functional in nature. Recommendation:           - Patient has a contact number available for                            emergencies. The signs and symptoms of potential                            delayed complications were discussed with the                            patient. Return to normal activities tomorrow.                            Written discharge instructions were provided to the                            patient.                           - Resume previous diet.                           - Continue present medications.                           - Await pathology results. We will communicate with                            you the results.                           - Prescribe Bentyl (or generic equivalent) 10 mg;                            #60; 2 refills. Take 1 or 2 every 4-6 hours as                             needed for pain Teresa Soltau N. [name], MD 10/10/2020 4:49:34 PM This report has been signed electronically.

## 2020-10-10 NOTE — Patient Instructions (Signed)
Prescription sent to [facility] pharmacy.  Handout given on polyps  YOU HAD AN ENDOSCOPIC PROCEDURE TODAY AT [place]:   Refer to the procedure report that was given to you for any specific questions about what was found during the examination.  If the procedure report does not answer your questions, please call your gastroenterologist to clarify.  If you requested that your care partner not be given the details of your procedure findings, then the procedure report has been included in a sealed envelope for you to review at your convenience later.  YOU SHOULD EXPECT: Some feelings of bloating in the abdomen. Passage of more gas than usual.  Walking can help get rid of the air that was put into your GI tract during the procedure and reduce the bloating. If you had a lower endoscopy (such as a colonoscopy or flexible sigmoidoscopy) you may notice spotting of blood in your stool or on the toilet paper. If you underwent a bowel prep for your procedure, you may not have a normal bowel movement for a few days.  Please Note:  You might notice some irritation and congestion in your nose or some drainage.  This is from the oxygen used during your procedure.  There is no need for concern and it should clear up in a day or so.  SYMPTOMS TO REPORT IMMEDIATELY:  Following lower endoscopy (colonoscopy or flexible sigmoidoscopy):  Excessive amounts of blood in the stool  Significant tenderness or worsening of abdominal pains  Swelling of the abdomen that is new, acute  Fever of 100F or higher  Following upper endoscopy (EGD)  Vomiting of blood or coffee ground material  New chest pain or pain under the shoulder blades  Painful or persistently difficult swallowing  New shortness of breath  Fever of 100F or higher  Black, tarry-looking stools  For urgent or emergent issues, a gastroenterologist can be reached at any hour by calling [phone]. Do not use MyChart messaging for urgent  concerns.    DIET:  We do recommend a small meal at first, but then you may proceed to your regular diet.  Drink plenty of fluids but you should avoid alcoholic beverages for 24 hours.  ACTIVITY:  You should plan to take it easy for the rest of today and you should NOT DRIVE or use heavy machinery until tomorrow (because of the sedation medicines used during the test).    FOLLOW UP: Our staff will call the number listed on your records 48-72 hours following your procedure to check on you and address any questions or concerns that you may have regarding the information given to you following your procedure. If we do not reach you, we will leave a message.  We will attempt to reach you two times.  During this call, we will ask if you have developed any symptoms of COVID 19. If you develop any symptoms (ie: fever, flu-like symptoms, shortness of breath, cough etc.) before then, please call [phone].  If you test positive for Covid 19 in the 2 weeks post procedure, please call and report this information to [place].    If any biopsies were taken you will be contacted by phone or by letter within the next 1-3 weeks.  Please call us at [phone] if you have not heard about the biopsies in 3 weeks.    SIGNATURES/CONFIDENTIALITY: You and/or your care partner have signed paperwork which will be entered into your electronic medical record.  These signatures attest  to the fact that that the information above on your After Visit Summary has been reviewed and is understood.  Full responsibility of the confidentiality of this discharge information lies with you and/or your care-partner.

## 2020-10-15 ENCOUNTER — Telehealth: Payer: Self-pay

## 2020-10-15 NOTE — Telephone Encounter (Signed)
Left message on answering machine. 

## 2020-10-16 ENCOUNTER — Telehealth: Payer: Self-pay

## 2020-10-16 NOTE — Telephone Encounter (Signed)
Unfortunately I have no medical basis to justify such an extensive leave or short-term disability.   We can provide her with an excuse for the day prior to, day of, and day after her procedures.   Thanks

## 2020-10-16 NOTE — Telephone Encounter (Signed)
Spoke with patient to clarify her request for short term disability papers.  She is requesting that she be written out from 10/02/2020 to 10/17/2020.   She had a endo/colon on 10/10/2020.  She reports that she missed work from 6/22 till her procedure because she had so much abdominal pain she could not get out of bed.  I told her that it might be possible to excuse her for the day before and day of her procedure but that her pcp Dr. Laverta Baltimore might be a better resource for the other days.  Please let me know how much you might be willing to do regarding her short term disability request before I start filling out the paperwork.

## 2020-10-17 ENCOUNTER — Telehealth: Payer: Self-pay

## 2020-10-17 NOTE — Telephone Encounter (Signed)
Lm on vm conveying Dr. Blanch Media response.  I offered to do a letter for those three days and asked her if she needed her paperwork back to see if Nicki Reaper Long could help with the remaining days.

## 2020-10-18 ENCOUNTER — Encounter: Payer: Self-pay | Admitting: Internal Medicine

## 2020-10-31 NOTE — Telephone Encounter (Signed)
Lm on vm
